# Patient Record
Sex: Male | Born: 1935
Health system: Southern US, Community
[De-identification: ages and names within clinical notes are randomized; demographics above are authoritative.]

## PROBLEM LIST (undated history)

## (undated) DIAGNOSIS — D649 Anemia, unspecified: Secondary | ICD-10-CM

## (undated) DIAGNOSIS — I5189 Other ill-defined heart diseases: Secondary | ICD-10-CM

## (undated) DIAGNOSIS — K469 Unspecified abdominal hernia without obstruction or gangrene: Secondary | ICD-10-CM

## (undated) DIAGNOSIS — Z860100 Personal history of colon polyps, unspecified: Secondary | ICD-10-CM

## (undated) DIAGNOSIS — I4510 Unspecified right bundle-branch block: Secondary | ICD-10-CM

## (undated) DIAGNOSIS — N200 Calculus of kidney: Secondary | ICD-10-CM

## (undated) DIAGNOSIS — S81802A Unspecified open wound, left lower leg, initial encounter: Secondary | ICD-10-CM

## (undated) DIAGNOSIS — I4821 Permanent atrial fibrillation: Secondary | ICD-10-CM

## (undated) DIAGNOSIS — N400 Enlarged prostate without lower urinary tract symptoms: Secondary | ICD-10-CM

## (undated) DIAGNOSIS — M1990 Unspecified osteoarthritis, unspecified site: Secondary | ICD-10-CM

## (undated) DIAGNOSIS — S46009A Unspecified injury of muscle(s) and tendon(s) of the rotator cuff of unspecified shoulder, initial encounter: Secondary | ICD-10-CM

## (undated) DIAGNOSIS — L02416 Cutaneous abscess of left lower limb: Secondary | ICD-10-CM

## (undated) DIAGNOSIS — K635 Polyp of colon: Secondary | ICD-10-CM

## (undated) DIAGNOSIS — E291 Testicular hypofunction: Secondary | ICD-10-CM

## (undated) DIAGNOSIS — K2970 Gastritis, unspecified, without bleeding: Secondary | ICD-10-CM

## (undated) HISTORY — DX: Calculus of kidney: N20.0

## (undated) HISTORY — PX: TOTAL HIP ARTHROPLASTY: SHX124

## (undated) HISTORY — DX: Gastritis, unspecified, without bleeding: K29.70

## (undated) HISTORY — DX: Unspecified abdominal hernia without obstruction or gangrene: K46.9

## (undated) HISTORY — PX: IRRIGATION AND DEBRIDEMENT HEMATOMA: SHX5254

## (undated) HISTORY — DX: Personal history of colon polyps, unspecified: Z86.0100

## (undated) HISTORY — DX: Permanent atrial fibrillation: I48.21

## (undated) HISTORY — DX: Testicular hypofunction: E29.1

## (undated) HISTORY — DX: Unspecified right bundle-branch block: I45.10

## (undated) HISTORY — PX: TRANSURETHRAL RESECTION OF PROSTATE: SHX73

## (undated) HISTORY — DX: Other ill-defined heart diseases: I51.89

## (undated) HISTORY — PX: OTHER SURGICAL HISTORY: SHX169

## (undated) HISTORY — DX: Unspecified injury of muscle(s) and tendon(s) of the rotator cuff of unspecified shoulder, initial encounter: S46.009A

## (undated) HISTORY — DX: Polyp of colon: K63.5

## (undated) HISTORY — PX: INGUINAL HERNIA REPAIR: SUR1180

## (undated) HISTORY — DX: Unspecified osteoarthritis, unspecified site: M19.90

## (undated) HISTORY — DX: Anemia, unspecified: D64.9

## (undated) HISTORY — DX: Benign prostatic hyperplasia without lower urinary tract symptoms: N40.0

## (undated) HISTORY — DX: Personal history of colonic polyps: Z86.010

---

## 1999-07-25 ENCOUNTER — Encounter: Payer: Self-pay | Admitting: Urology

## 1999-07-26 ENCOUNTER — Encounter: Payer: Self-pay | Admitting: Urology

## 1999-07-26 ENCOUNTER — Encounter (INDEPENDENT_AMBULATORY_CARE_PROVIDER_SITE_OTHER): Payer: Self-pay | Admitting: *Deleted

## 1999-07-26 ENCOUNTER — Ambulatory Visit (HOSPITAL_COMMUNITY): Admission: RE | Admit: 1999-07-26 | Discharge: 1999-07-26 | Payer: Self-pay | Admitting: Urology

## 1999-10-31 ENCOUNTER — Encounter: Admission: RE | Admit: 1999-10-31 | Discharge: 1999-10-31 | Payer: Self-pay | Admitting: Urology

## 1999-10-31 ENCOUNTER — Encounter: Payer: Self-pay | Admitting: Urology

## 2002-02-05 ENCOUNTER — Encounter: Payer: Self-pay | Admitting: General Surgery

## 2002-02-10 ENCOUNTER — Encounter (INDEPENDENT_AMBULATORY_CARE_PROVIDER_SITE_OTHER): Payer: Self-pay | Admitting: *Deleted

## 2002-02-10 ENCOUNTER — Observation Stay (HOSPITAL_COMMUNITY): Admission: RE | Admit: 2002-02-10 | Discharge: 2002-02-11 | Payer: Self-pay | Admitting: General Surgery

## 2007-06-22 ENCOUNTER — Ambulatory Visit (HOSPITAL_COMMUNITY): Admission: RE | Admit: 2007-06-22 | Discharge: 2007-06-23 | Payer: Self-pay | Admitting: General Surgery

## 2007-06-22 ENCOUNTER — Encounter (INDEPENDENT_AMBULATORY_CARE_PROVIDER_SITE_OTHER): Payer: Self-pay | Admitting: General Surgery

## 2007-06-22 ENCOUNTER — Encounter (INDEPENDENT_AMBULATORY_CARE_PROVIDER_SITE_OTHER): Payer: Self-pay | Admitting: *Deleted

## 2009-03-31 ENCOUNTER — Encounter (INDEPENDENT_AMBULATORY_CARE_PROVIDER_SITE_OTHER): Payer: Self-pay | Admitting: *Deleted

## 2009-05-03 ENCOUNTER — Ambulatory Visit: Payer: Self-pay | Admitting: Internal Medicine

## 2009-05-03 DIAGNOSIS — R1310 Dysphagia, unspecified: Secondary | ICD-10-CM | POA: Insufficient documentation

## 2009-05-03 DIAGNOSIS — D126 Benign neoplasm of colon, unspecified: Secondary | ICD-10-CM | POA: Insufficient documentation

## 2009-05-09 ENCOUNTER — Telehealth: Payer: Self-pay | Admitting: Internal Medicine

## 2009-06-02 ENCOUNTER — Ambulatory Visit: Payer: Self-pay | Admitting: Internal Medicine

## 2009-06-02 DIAGNOSIS — K2970 Gastritis, unspecified, without bleeding: Secondary | ICD-10-CM | POA: Insufficient documentation

## 2009-06-02 DIAGNOSIS — K259 Gastric ulcer, unspecified as acute or chronic, without hemorrhage or perforation: Secondary | ICD-10-CM | POA: Insufficient documentation

## 2009-06-02 DIAGNOSIS — K2990 Gastroduodenitis, unspecified, without bleeding: Secondary | ICD-10-CM

## 2009-06-06 ENCOUNTER — Encounter: Payer: Self-pay | Admitting: Internal Medicine

## 2009-07-26 ENCOUNTER — Encounter: Payer: Self-pay | Admitting: Internal Medicine

## 2009-07-27 ENCOUNTER — Encounter: Payer: Self-pay | Admitting: Internal Medicine

## 2009-08-03 DIAGNOSIS — M1711 Unilateral primary osteoarthritis, right knee: Secondary | ICD-10-CM | POA: Insufficient documentation

## 2009-08-03 DIAGNOSIS — M129 Arthropathy, unspecified: Secondary | ICD-10-CM | POA: Insufficient documentation

## 2009-08-03 DIAGNOSIS — J438 Other emphysema: Secondary | ICD-10-CM | POA: Insufficient documentation

## 2009-08-04 ENCOUNTER — Ambulatory Visit: Payer: Self-pay | Admitting: Internal Medicine

## 2009-08-04 DIAGNOSIS — I482 Chronic atrial fibrillation, unspecified: Secondary | ICD-10-CM | POA: Insufficient documentation

## 2009-08-24 ENCOUNTER — Encounter: Payer: Self-pay | Admitting: Internal Medicine

## 2009-08-24 ENCOUNTER — Ambulatory Visit: Payer: Self-pay

## 2009-08-24 ENCOUNTER — Ambulatory Visit: Payer: Self-pay | Admitting: Cardiology

## 2009-08-24 ENCOUNTER — Ambulatory Visit (HOSPITAL_COMMUNITY): Admission: RE | Admit: 2009-08-24 | Discharge: 2009-08-24 | Payer: Self-pay | Admitting: Internal Medicine

## 2009-11-21 ENCOUNTER — Ambulatory Visit (HOSPITAL_COMMUNITY): Admission: RE | Admit: 2009-11-21 | Discharge: 2009-11-21 | Payer: Self-pay | Admitting: Internal Medicine

## 2009-11-29 ENCOUNTER — Ambulatory Visit: Payer: Self-pay | Admitting: Internal Medicine

## 2010-06-13 ENCOUNTER — Encounter: Payer: Self-pay | Admitting: Internal Medicine

## 2010-06-13 ENCOUNTER — Other Ambulatory Visit: Payer: Self-pay | Admitting: Internal Medicine

## 2010-06-13 ENCOUNTER — Ambulatory Visit
Admission: RE | Admit: 2010-06-13 | Discharge: 2010-06-13 | Payer: Self-pay | Source: Home / Self Care | Attending: Internal Medicine | Admitting: Internal Medicine

## 2010-06-13 LAB — CBC WITH DIFFERENTIAL/PLATELET
Basophils Absolute: 0 10*3/uL (ref 0.0–0.1)
Basophils Relative: 0.5 % (ref 0.0–3.0)
Eosinophils Absolute: 0.2 10*3/uL (ref 0.0–0.7)
Eosinophils Relative: 2.2 % (ref 0.0–5.0)
HCT: 36.3 % — ABNORMAL LOW (ref 39.0–52.0)
Hemoglobin: 12.5 g/dL — ABNORMAL LOW (ref 13.0–17.0)
Lymphocytes Relative: 19.6 % (ref 12.0–46.0)
Lymphs Abs: 1.5 10*3/uL (ref 0.7–4.0)
MCHC: 34.5 g/dL (ref 30.0–36.0)
MCV: 105.8 fl — ABNORMAL HIGH (ref 78.0–100.0)
Monocytes Absolute: 0.6 10*3/uL (ref 0.1–1.0)
Monocytes Relative: 8.1 % (ref 3.0–12.0)
Neutro Abs: 5.4 10*3/uL (ref 1.4–7.7)
Neutrophils Relative %: 69.6 % (ref 43.0–77.0)
Platelets: 138 10*3/uL — ABNORMAL LOW (ref 150.0–400.0)
RBC: 3.43 Mil/uL — ABNORMAL LOW (ref 4.22–5.81)
RDW: 13.8 % (ref 11.5–14.6)
WBC: 7.7 10*3/uL (ref 4.5–10.5)

## 2010-06-13 LAB — BASIC METABOLIC PANEL
BUN: 17 mg/dL (ref 6–23)
CO2: 25 mEq/L (ref 19–32)
Calcium: 8.8 mg/dL (ref 8.4–10.5)
Chloride: 106 mEq/L (ref 96–112)
Creatinine, Ser: 1.4 mg/dL (ref 0.4–1.5)
GFR: 52.55 mL/min — ABNORMAL LOW (ref >60.00)
Glucose, Bld: 69 mg/dL — ABNORMAL LOW (ref 70–99)
Potassium: 3.9 mEq/L (ref 3.5–5.1)
Sodium: 138 mEq/L (ref 135–145)

## 2010-06-26 NOTE — Miscellaneous (Signed)
Summary: Orders Update/ clotest  Clinical Lists Changes  Problems: Added new problem of GASTRIC ULCER (ICD-531.90) Added new problem of GASTRITIS (ICD-535.50) Orders: Added new Test order of TLB-H Pylori Screen Gastric Biopsy (83013-CLOTEST) - Signed

## 2010-06-26 NOTE — Procedures (Signed)
Summary: Upper Endoscopy  Patient: [name] Note: All result statuses are Final unless otherwise noted.  Tests: (1) Upper Endoscopy (EGD)   EGD Upper Endoscopy       DONE     [facility]     [address]     [place], [place]  [address]           ENDOSCOPY PROCEDURE REPORT           PATIENT:  Gerald, Sandoval  MR#:  [id number]     BIRTHDATE:  04-07-1924, 85 yrs. old  GENDER:  male           ENDOSCOPIST:  Wilhemina Bonito. Eda Keys, MD     Referred by:  Rodrigo Ran, M.D.           PROCEDURE DATE:  06/02/2009     PROCEDURE:  EGD with biopsy, Maloney Dilation of Esophagus -9f     ASA CLASS:  Class III     INDICATIONS:  dysphagia           MEDICATIONS:   There was residual sedation effect present from     prior procedure., Versed 1 mg IM     TOPICAL ANESTHETIC:  Exactacain Spray           DESCRIPTION OF PROCEDURE:   After the risks benefits and     alternatives of the procedure were thoroughly explained, informed     consent was obtained.  The [facility] [id number] endoscope was     introduced through the mouth and advanced to the second portion of     the duodenum, without limitations.  The instrument was slowly     withdrawn as the mucosa was fully examined.     <<PROCEDUREIMAGES>>           A large caliber stricture was found in the distal esophagus.  A     hiatal hernia was found.  Multiple erosions were found in the     antrum.Clo bx taken.  Otherwise the examination was normal.     Retroflexed views revealed the hiatal hernia.    The scope was then     withdrawn from the patient and the procedure completed.           THERAPY: 46F MALONEY DILATION W/O RESISTANCE OR HEME           COMPLICATIONS:  None           ENDOSCOPIC IMPRESSION:     1) Stricture in the distal esophagus- S/P 46F MALONEY DILATION     2) Hiatal hernia     3) Erosions, multiple in the antrum - S/P CLO TEST     4) Otherwise normal examination           RECOMMENDATIONS:     1) Clear liquids until 5PM,  then soft foods rest of day. Resume     prior diet tomorrow.           ______________________________     Wilhemina Bonito. Eda Keys, MD           CC:  Rodrigo Ran, MD, The Patient           n.     eSIGNED:   Wilhemina Bonito. Eda Keys at 06/02/2009 03:26 PM           Evenson, Fayrene Fearing, [phone]  Note: An exclamation mark (!) indicates a result that was not dispersed into the flowsheet. Document Creation Date: 06/02/2009 3:26 PM  _______________________________________________________________________  (1) Order result status: Final Collection or observation date-time: 06/02/2009 14:55 Requested date-time:  Receipt date-time:  Reported date-time:  Referring Physician:   Ordering Physician: Fransico Setters [phone]) Specimen Source:  Source: Launa Grill Order Number: [id number] Lab site:

## 2010-06-26 NOTE — Assessment & Plan Note (Signed)
Summary: 4 month rov/sl;   Visit Type:  Follow-up Referring Provider:  Loraine Leriche Perini,MD Primary Provider:  Loraine Leriche Perini,MD   History of Present Illness: The patient presents today for routine electrophysiology followup. He reports doing very well since last being seen in our clinic.  He reports mild dypsnea with moderate activity.   The patient denies symptoms of palpitations, chest pain, shortness of breath at rest, orthopnea, PND, lower extremity edema, dizziness, presyncope, syncope, or neurologic sequela. The patient is tolerating medications without difficulties and is otherwise without complaint today.   Current Medications (verified): 1)  Amoxicillin 500 Mg Caps (Amoxicillin) .Marland Kitchen.. 1 By Mouth Two Times A Day 2)  Finasteride 5 Mg Tabs (Finasteride) .Marland Kitchen.. 1 By Mouth Once Daily 3)  Bactrim Ds 800-160 Mg Tabs (Sulfamethoxazole-Trimethoprim) .Marland Kitchen.. 1 By Mouth Two Times A Day 4)  Vitamin D (Ergocalciferol) 50000 Unit Caps (Ergocalciferol) .Marland Kitchen.. 1 By Mouth Twice Weekly As Directed 5)  Metoprolol Succinate 50 Mg Xr24h-Tab (Metoprolol Succinate) .Marland Kitchen.. 1 1/2 By Mouth Once Daily 6)  Iron 325 (65 Fe) Mg Tabs (Ferrous Sulfate) .Marland Kitchen.. 1 By Mouth Once Daily 7)  K-Phos 500 Mg Tabs (Potassium Phosphate Monobasic) .... Take Up To 2-3 Tablets Once Daily With Food As Directed 8)  Vitamin B-12 5000 Mcg Subl (Cyanocobalamin) .Marland Kitchen.. 1 By Mouth Once Daily 9)  Pradaxa 150 Mg Caps (Dabigatran Etexilate Mesylate) .Marland Kitchen.. 1 Capsule Two Times A Day  Allergies: 1)  ! * Primidone  Past History:  Past Medical History: Persistent afib OSTEOARTHRITIS (ICD-715.90) GASTRITIS (ICD-535.50) GASTRIC ULCER (ICD-531.90) COLONIC POLYPS (ICD-211.3) DYSPHAGIA UNSPECIFIED (ICD-787.20) EMPHYSEMA (ICD-492.8) (pt unaware) CHF BPH Rotator Cuff Injury Rt. Inguinial Hernia RBBB B12 Insufficiency Kidney Stones  Past Surgical History: Reviewed history from 05/03/2009 and no changes required. TURP Rt. Inguinal Hernia Repair lt.  THR Resection of ribs hip replacement  both hips  Social History: Reviewed history from 08/04/2009 and no changes required. Pt lives in [place] alone.   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  His closest living relative is his sister who is 57 and lives in a nursing home. Illicit Drug Use - no no caffeine Patient denies any significant h/o Tobacco. Alcohol Use - no  Vital Signs:  Patient profile:   77 year old male Height:      68 inches Weight:      148 pounds BMI:     22.58 Pulse rate:   78 / minute BP sitting:   116 / 68  (left arm)  Vitals Entered By: Laurance Flatten CMA (November 29, 2009 10:45 AM)  Physical Exam  General:  elderly male, NAD Head:  normocephalic and atraumatic Eyes:  PERRLA/EOM intact; conjunctiva and lids normal. Mouth:  Teeth, gums and palate normal. Oral mucosa normal. Neck:  supple Lungs:  Clear bilaterally to auscultation and percussion. Heart:  iRRR, no m/r/g Abdomen:  Bowel sounds positive; abdomen soft and non-tender without masses, organomegaly, or hernias noted. No hepatosplenomegaly. Msk:  Back normal, normal gait. Muscle strength and tone normal. Pulses:  pulses normal in all 4 extremities Extremities:  No clubbing or cyanosis. Neurologic:  Alert and oriented x 3.   EKG  Procedure date:  11/29/2009  Findings:      afib,  V rate 70s, LVH, RBBB  Impression & Recommendations:  Problem # 1:  ATRIAL FIBRILLATION (ICD-427.31) stable with minimal symptoms rate controlled and appropriately anticoagulated with pradaxa recent echo is reviewed and reveals normal EF.  His LA/RA are enlarged suggesting that afib is longstanding.  no changes today  Patient Instructions: 1)  Your physician wants you to follow-up in: 6 months with Dr Johney Frame.  You will receive a reminder letter in the mail two months in advance. If you don't receive a letter, please call our office to  schedule the follow-up appointment. 2)  Your physician recommends that you continue on your current medications as directed. Please refer to the Current Medication list given to you today.

## 2010-06-26 NOTE — Letter (Signed)
Summary: [facility] Office Note  [facility] Office Note   Imported By: Roderic Ovens 08/17/2009 14:32:04  _____________________________________________________________________  External Attachment:    Type:   Image     Comment:   External Document

## 2010-06-26 NOTE — Procedures (Signed)
Summary: Colonoscopy  Patient: [name] Note: All result statuses are Final unless otherwise noted.  Tests: (1) Colonoscopy (COL)   COL Colonoscopy           DONE     [facility]     [address]     [place], [place]  [address]           COLONOSCOPY PROCEDURE REPORT           PATIENT:  Gerald Sandoval, Gerald Sandoval  MR#:  [id number]     BIRTHDATE:  1924-02-11, 85 yrs. old  GENDER:  male           ENDOSCOPIST:  Wilhemina Bonito. Eda Keys, MD     Referred by:  Rodrigo Ran, M.D.           PROCEDURE DATE:  06/02/2009     PROCEDURE:  Colonoscopy with snare polypectomy     ASA CLASS:  Class III     INDICATIONS:  history of pre-cancerous (adenomatous) colon polyps     (02-2003 w/ HGD)           MEDICATIONS:   Fentanyl 25 mcg IV, Versed 3 mg IV           DESCRIPTION OF PROCEDURE:   After the risks benefits and     alternatives of the procedure were thoroughly explained, informed     consent was obtained.  Digital rectal exam was performed and     revealed no abnormalities.   The [facility] [id number] endoscope     was introduced through the anus and advanced to the cecum, which     was identified by both the appendix and ileocecal valve, without     limitations. Time to cecum = 4:35min. The quality of the prep was     excellent, using MoviPrep.  The instrument was then slowly     withdrawn (time = 18:41 min)as the colon was fully examined.     <<PROCEDUREIMAGES>>           FINDINGS:  Three polyps were found in the ascending colon     (64mm,3mm) and sigmoid (4mm). Polyps were snared without cautery.     Retrieval was successful. snare polyp  Moderate diverticulosis was     found in the sigmoid colon.   Retroflexed views in the rectum     revealed internal hemorrhoids.    The scope was then withdrawn     from the patient and the procedure completed.           COMPLICATIONS:  None           ENDOSCOPIC IMPRESSION:     1) Three polyps -removed     2) Moderate diverticulosis in the sigmoid colon     3) Internal hemorrhoids     RECOMMENDATIONS:     1) Return to the care of your primary provider. GI follow up as     needed           ______________________________     Wilhemina Bonito. Eda Keys, MD           CC:  Rodrigo Ran, MD, The Patient           n.     eSIGNED:   Wilhemina Bonito. Eda Keys at 06/02/2009 03:19 PM           Haggar, Fayrene Fearing, [id number]  Note: An exclamation mark (!) indicates a result that was not dispersed into the flowsheet. Document Creation  Date: 06/02/2009 3:19 PM _______________________________________________________________________  (1) Order result status: Final Collection or observation date-time: 06/02/2009 14:47 Requested date-time:  Receipt date-time:  Reported date-time:  Referring Physician:   Ordering Physician: Fransico Setters [phone]) Specimen Source:  Source: Launa Grill Order Number: [id number] Lab site:

## 2010-06-26 NOTE — Letter (Signed)
Summary: Patient Notice- Polyp Results  [facility]  [address] [place], [place] [address]   Phone: [phone]  Fax: [phone]        June 06, 2009 MRN: [id number]    Gerald Sandoval [address] [place], [place]  [address]    Dear [name],  I am pleased to inform you that the colon polyp(s) removed during your recent colonoscopy was (were) found to be benign (no cancer detected) upon pathologic examination.   Additional information/recommendations:  __ No further action with gastroenterology is needed at this time. Please      follow-up with your primary care physician for your other healthcare      needs.   Please call us if you are having persistent problems or have questions about your condition that have not been fully answered at this time.  Sincerely,  Hilarie Fredrickson MD  This letter has been electronically signed by your physician.  Appended Document: Patient Notice- Polyp Results Letter mailed 01.13.11

## 2010-06-26 NOTE — Assessment & Plan Note (Signed)
Summary: nep/new onset afib   Visit Type:  Initial Consult Referring Provider:  Emiliano Dyer Primary Provider:  Loraine Leriche Perini,MD  CC:  afib.  History of Present Illness: Mr Gerald Sandoval is a pleasant 77 yo WM with newly discovered atrial fibrillation who presents today for cardiology consultation.  He had a GI workup including EGD/colonoscopy 1/11.  He was observed on rhythm strip to have atrial fibrillation.  He subsequently  presented to Dr Johny Chess 3/11 and was again found to be in atrial fibrillation.  The patient denies symptoms of palpitations, chest pain, shortness of breath, orthopnea, PND, lower extremity edema, dizziness, presyncope, syncope, or neurologic sequela.  He remains active and does yard work without difficulty.  He has beel placed on pradaxa which he is tolerating.  He is otherwise without complaint today.    Current Medications (verified): 1)  Amoxicillin 500 Mg Caps (Amoxicillin) .Marland Kitchen.. 1 By Mouth Two Times A Day 2)  Finasteride 5 Mg Tabs (Finasteride) .Marland Kitchen.. 1 By Mouth Once Daily 3)  Bactrim Ds 800-160 Mg Tabs (Sulfamethoxazole-Trimethoprim) .Marland Kitchen.. 1 By Mouth Two Times A Day 4)  Vitamin D (Ergocalciferol) 50000 Unit Caps (Ergocalciferol) .Marland Kitchen.. 1 By Mouth Twice Weekly As Directed 5)  Metoprolol Succinate 50 Mg Xr24h-Tab (Metoprolol Succinate) .Marland Kitchen.. 1 By Mouth Once Daily 6)  Iron 325 (65 Fe) Mg Tabs (Ferrous Sulfate) .Marland Kitchen.. 1 By Mouth Once Daily 7)  K-Phos 500 Mg Tabs (Potassium Phosphate Monobasic) .... Take Up To 2-3 Tablets Once Daily With Food As Directed 8)  Vitamin B-12 5000 Mcg Subl (Cyanocobalamin) .Marland Kitchen.. 1 By Mouth Once Daily 9)  Pradaxa 150 Mg Caps (Dabigatran Etexilate Mesylate) .Marland Kitchen.. 1 Capsule Two Times A Day  Allergies: 1)  ! * Primidone  Past History:  Past Medical History: ARTHRITIS (ICD-716.90) OSTEOARTHRITIS (ICD-715.90) GASTRITIS (ICD-535.50) GASTRIC ULCER (ICD-531.90) COLONIC POLYPS (ICD-211.3) DYSPHAGIA UNSPECIFIED (ICD-787.20) EMPHYSEMA (ICD-492.8) (pt  unaware) CHF BPH Rotator Cuff Injury Rt. Inguinial Hernia RBBB B12 Insufficiency Kidney Stones  Past Surgical History: Reviewed history from 05/03/2009 and no changes required. TURP Rt. Inguinal Hernia Repair lt. THR Resection of ribs hip replacement  both hips  Family History: Reviewed history from 05/03/2009 and no changes required. Family History of Colon Cancer:uncle Family History of Diabetes: aunt  Social History: Pt lives in [place] alone.   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  His closest living relative is his sister who is 67 and lives in a nursing home. Illicit Drug Use - no no caffeine Patient denies any significant h/o Tobacco. Alcohol Use - no  Review of Systems       All systems are reviewed and negative except as listed in the HPI.   Vital Signs:  Patient profile:   77 year old male Height:      68 inches Weight:      153 pounds Pulse rate:   86 / minute BP sitting:   130 / 66  (left arm)  Vitals Entered By: Laurance Flatten CMA (August 04, 2009 11:41 AM)  Physical Exam  General:  thin, edlery male, NAD Head:  normocephalic and atraumatic Eyes:  PERRLA/EOM intact; conjunctiva and lids normal. Mouth:  Teeth, gums and palate normal. Oral mucosa normal. Neck:  Neck supple, no JVD. No masses, thyromegaly or abnormal cervical nodes. Lungs:  Clear bilaterally to auscultation and percussion. Heart:  iRRR, no m/r/g Abdomen:  Bowel sounds positive; abdomen soft and non-tender without masses, organomegaly, or hernias noted. No hepatosplenomegaly.  Msk:  Back normal, normal gait. Muscle strength and tone normal. Pulses:  pulses normal in all 4 extremities Extremities:  No clubbing or cyanosis. Trace edema Neurologic:  Alert and oriented x 3.  CNII-XII intact, strength/sensation are intact Skin:  several precancerous skin lesions (pt aware) Cervical Nodes:  no significant  adenopathy Psych:  Normal affect.   EKG  Procedure date:  08/04/2009  Findings:      afib, V rate 86, RBBB, nonspecific ST/T changes  Impression & Recommendations:  Problem # 1:  ATRIAL FIBRILLATION (ICD-427.31) The patient presents today for consultation regarding afib.  He has been found to have persistent afib, without significant symptoms.  He is without symptoms of CHF or ischemia.  A recent TSH by PCP was normal.  His CHADS2 score is 1.  He is appropriately anticoagulated with pradaxa.  Therapeutic strategies for afib were discussed in detail with the patient today. As he is asymptomatic, I would favor a strategy of rate control longterm.  I have increased toprol xl to 75mg  daliy today.  He should continue coumadin longterm.  We will obtain an echocardiogram to evaluate for structual heart disease.  If his echo looks OK, then I would not recommend further cardiac testing at this time.  Other Orders: Echocardiogram (Echo)  Patient Instructions: 1)  Your physician recommends that you schedule a follow-up appointment in: 4 months with Dr Johney Frame 2)  Your physician has recommended you make the following change in your medication: increase your Metoprolol XL to 75mg  daily 3)  Your physician has requested that you have an echocardiogram.  Echocardiography is a painless test that uses sound waves to create images of your heart. It provides your doctor with information about the size and shape of your heart and how well your heart's chambers and valves are working.  This procedure takes approximately one hour. There are no restrictions for this procedure. Prescriptions: METOPROLOL SUCCINATE 50 MG XR24H-TAB (METOPROLOL SUCCINATE) 1 1/2 by mouth once daily  #45 x 11   Entered by:   Dennis Bast, RN, BSN   Authorized by:   Hillis Range, MD   Signed by:   Dennis Bast, RN, BSN on 08/04/2009   Method used:   Electronically to        [facility] (retail)       [address]       [place], [place]  [address]       Ph: [phone]       Fax: [phone]   RxID:   [id number]

## 2010-06-28 NOTE — Assessment & Plan Note (Signed)
Summary: 6 month return.amber   Visit Type:  Follow-up Referring Provider:  Loraine Leriche Perini,MD Primary Provider:  Loraine Leriche Perini,MD   History of Present Illness: The patient presents today for routine electrophysiology followup. He reports doing very well since last being seen in our clinic.  He reports mild dypsnea with moderate activity which has not changed.   The patient denies symptoms of palpitations, chest pain, shortness of breath at rest, orthopnea, PND, lower extremity edema, dizziness, presyncope, syncope, or neurologic sequela. The patient is tolerating medications without difficulties and is otherwise without complaint today.   Current Medications (verified): 1)  Amoxicillin 500 Mg Caps (Amoxicillin) .Marland Kitchen.. 1 By Mouth Two Times A Day 2)  Finasteride 5 Mg Tabs (Finasteride) .Marland Kitchen.. 1 By Mouth Once Daily 3)  Bactrim Ds 800-160 Mg Tabs (Sulfamethoxazole-Trimethoprim) .Marland Kitchen.. 1 By Mouth Two Times A Day 4)  Vitamin D (Ergocalciferol) 50000 Unit Caps (Ergocalciferol) .Marland Kitchen.. 1 By Mouth Twice Weekly As Directed 5)  Metoprolol Succinate 50 Mg Xr24h-Tab (Metoprolol Succinate) .Marland Kitchen.. 1 1/2 By Mouth Once Daily 6)  Iron 325 (65 Fe) Mg Tabs (Ferrous Sulfate) .Marland Kitchen.. 1 By Mouth Once Daily 7)  K-Phos 500 Mg Tabs (Potassium Phosphate Monobasic) .... Take Up To 2-3 Tablets Once Daily With Food As Directed 8)  Vitamin B-12 5000 Mcg Subl (Cyanocobalamin) .Marland Kitchen.. 1 By Mouth Once Daily 9)  Pradaxa 150 Mg Caps (Dabigatran Etexilate Mesylate) .Marland Kitchen.. 1 Capsule Two Times A Day  Allergies (verified): 1)  ! * Primidone  Past History:  Past Medical History: Permanent afib OSTEOARTHRITIS (ICD-715.90) GASTRITIS (ICD-535.50) GASTRIC ULCER (ICD-531.90) COLONIC POLYPS (ICD-211.3) DYSPHAGIA UNSPECIFIED (ICD-787.20) EMPHYSEMA (ICD-492.8) (pt unaware) CHF BPH Rotator Cuff Injury Rt. Inguinial Hernia RBBB B12 Insufficiency Kidney Stones  Past Surgical History: Reviewed history from 05/03/2009 and no changes  required. TURP Rt. Inguinal Hernia Repair lt. THR Resection of ribs hip replacement  both hips  Social History: Reviewed history from 08/04/2009 and no changes required. Pt lives in [place] alone.   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  His closest living relative is his sister who is 76 and lives in a nursing home. Illicit Drug Use - no no caffeine Patient denies any significant h/o Tobacco. Alcohol Use - no  Review of Systems       All systems are reviewed and negative except as listed in the HPI.   Vital Signs:  Patient profile:   77 year old male Height:      68 inches Weight:      147 pounds Pulse rate:   72 / minute BP sitting:   110 / 70  (left arm)  Vitals Entered By: Jacquelin Hawking, CMA (June 13, 2010 12:09 PM)  Physical Exam  General:  elderly male, NAD Head:  normocephalic and atraumatic Eyes:  PERRLA/EOM intact; conjunctiva and lids normal. Mouth:  Teeth, gums and palate normal. Oral mucosa normal. Neck:  supple Lungs:  Clear bilaterally to auscultation and percussion. Heart:  iRRR, no m/r/g Abdomen:  Bowel sounds positive; abdomen soft and non-tender without masses, organomegaly, or hernias noted. No hepatosplenomegaly. Msk:  Back normal, normal gait. Muscle strength and tone normal. Extremities:  No clubbing or cyanosis. Neurologic:  Alert and oriented x 3. Skin:  several precancerous skin lesions (pt aware) Psych:  Normal affect.   EKG  Procedure date:  06/13/2010  Findings:      afib, V rate 69 bpm, RBBB  Impression & Recommendations:  Problem #  1:  ATRIAL FIBRILLATION (ICD-427.31) rate controlled tolerating pradaxa His CrCl today is 37.  We will therefore continue pradaxa 150mg  two times a day at this time.  We will continue to monitor his CrCl and decrease the dose if CrCl drops below 30.   He has a macrocytic anemai.  I will have patient follow-up  with Dr Waynard Edwards for this.  Other Orders: EKG w/ Interpretation ([address]) TLB-BMP (Basic Metabolic Panel-BMET) (80048-METABOL) TLB-CBC Platelet - w/Differential (85025-CBCD)  Patient Instructions: 1)  Your physician wants you to follow-up in: 6 months with Dr Johney Frame  Bonita Quin will receive a reminder letter in the mail two months in advance. If you don't receive a letter, please call our office to schedule the follow-up appointment. 2)  Your physician recommends that you continue on your current medications as directed. Please refer to the Current Medication list given to you today.

## 2010-08-13 ENCOUNTER — Telehealth: Payer: Self-pay | Admitting: Internal Medicine

## 2010-08-23 NOTE — Progress Notes (Signed)
Summary: rx refill   Phone Note Refill Request Message from:  Patient on August 13, 2010 9:22 AM  Refills Requested: Medication #1:  METOPROLOL SUCCINATE 50 MG XR24H-TAB 1 1/2 by mouth once daily  Method Requested: Telephone to Pharmacy Initial call taken by: Roe Coombs,  August 13, 2010 9:22 AM Caller: Patient    Prescriptions: METOPROLOL SUCCINATE 50 MG XR24H-TAB (METOPROLOL SUCCINATE) 1 1/2 by mouth once daily  #45 x 10   Entered by:   Hardin Negus, RMA   Authorized by:   Hillis Range, MD   Signed by:   Hardin Negus, RMA on 08/13/2010   Method used:   Electronically to        [facility]* (retail)       [address]       [place], [place]  [id number]       Ph: [phone]       Fax: [phone]   RxID:   [id number]

## 2010-10-09 NOTE — Op Note (Signed)
NAME:  Gerald Sandoval, Gerald Sandoval                 ACCOUNT NO.:  [id number]   MEDICAL RECORD NO.:  [id number]          PATIENT TYPE:  AMB   LOCATION:  DAY                          FACILITY:  [facility]   PHYSICIAN:  Anselm Pancoast. Weatherly, M.D.DATE OF BIRTH:  02/10/1924   DATE OF PROCEDURE:  06/22/2007  DATE OF DISCHARGE:                               OPERATIVE REPORT   PREOPERATIVE DIAGNOSIS:  Large left inguinal hernia, probably slider.   POSTOPERATIVE DIAGNOSIS:  Left inguinal hernia with large segment of  colon in hernia sac.   OPERATION:  Left inguinal herniorrhaphy with mesh reinforcement.  It was  a slider.   SURGEON:  Anselm Pancoast. Zachery Dakins, M.D.   ASSISTANT:  Nurse.   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  He has a problem with BPH.  I had also repaired a right  inguinal hernia back in 2003.  He is followed by Dr. Aldean Ast with BPH-  type symptoms.  The patient is living in a retirement home and stated  that he had had a problem with a hip replacement over at [facility]  approximately 3 years ago for which he is on chronic antibiotics.  It is  not MRSA, and on examination of the hip I could not see any obvious  evidence of infection.  We got the records from [facility].  He is on  amoxicillin.  I have a note from the infectious disease nurse, but  amoxicillin covers it, and they thought it would be safe for [place] to go  ahead and proceed with his inguinal herniorrhaphy and also use mesh  since he has showed no signs of any active infection over the last year  and a half.  The patient has a large hernia, about size of orange, that  is not reducible.  There is no blood in the stool and preoperatively had  a bottle of mag citrate yesterday.  His white count this morning is  5600, and his serum electrolytes are normal.  BUN is 17, creatinine of  117. He has nocturia about three to  four times a night, and I discussed  about placing a Foley catheter in him at the time of his herniorrhaphy,  and we will see about removing it the morning after surgery, and if he  is able to void, will discharge him.  He is on Proscar for urinary  problems.   Preoperatively, he was given antibiotics.  He has PAS stockings.  He was  taken back to the operative suite and induction of anesthesia by Dr.  Shireen Quan, endotracheal tube, and then I placed a Foley catheter, #16,  easily and then shaved or clipped the groin on the left that had been  previously marked and then draped him in a sterile manner after a  Betadine prep.  The patient's hernia, with him asleep, you could still  feel bowel within the hernia sac, but it was not as large as when he is  standing.  I first placed about 10 mL of Marcaine with adrenalin in the  inguinal ligament area right at the anterior iliac crest area with a  blunted 22-gauge needle and then made an incision down through the skin  and subcutaneous Scarpa's fascia.  Opened up the external ring, and then  the cord structures were elevated and dissected right over the symphysis  pubis area with a Penrose drain.  Intestine was in the hernia, and I  elected to dissect down carefully and opened the hernia sac, and there  was probably about 12 inches or more of sigmoid colon but 5 or 6 of it  actually along the posterior wall of the hernia sac inferiorly and  laterally.  I carefully dissected the colon from the hernia sac so I  could place the sigmoid colon back within the peritoneal cavity and then  used the peritoneum to close it anteriorly.  This had been taken down  very carefully so I did not compromise the blood supply to the colon or  compromise the blood supply going to the testicle.  The cord structures  all were retracted with the Penrose after I had freed up the colon and  placed it back intraperitoneally, and then the peritoneum was closed at  the  internal ring area with a running 2-0 Vicryl.  This is what I used  also to extraperitonealize the raw surface of the sigmoid colon after it  had been freed from the actual hernia sac.  I then closed the floor that  had been completely destroyed with this large chronically incarcerated  hernia starting at the symphysis pubis with a 2-0 Prolene, bringing down  the conjoined tendon area medially to the inguinal ligament inferiorly  and then recreated the internal ring.  I could still admit my index  finger in easily, but I think we got a good closure and then went back  with the 2-0 Prolene and tied the two tails together.  I next put a  piece of Prolene mesh, reinforcing the repair and sutured it to the  inguinal ligament inferiorly with a running 2-0 Prolene.  The mesh is  under the iliohypogastric which came out pretty close to the cord  structures, and the two tails were sutured together laterally, taking  care that we did not compromise the structures or the vessels to the  testicle.  Next, the superior flap was sutured with interrupted sutures  of 2-0 Prolene.  I did put some more Marcaine in the floor to minimize  postoperative pain and then closed the external oblique with a 2-0  Vicryl, and then Scarpa's fascia was closed with interrupted 3-0 Vicryl  and a 4-0 Dexon subcuticular, with Benzoin and Steri-Strips on the skin.  The testicle was in its normal position.  The area was flat where the  bulge had been preoperatively.   The patient was extubated and taken to the recovery room in stable  postoperative condition.  The urine was always clear, and we are  planning to leave the Foley in until tomorrow.  I will let him be on  liquids today, but I would like for him to be able to void and have  bowel function before he is discharged.  The patient's colon appeared to  be decompressed with the laxative the he had received yesterday.  Sponge  and needle counts were correct x2.  The  estimated blood loss was  minimal.  A sterile occlusive dressing was applied.  The testicle  was in  its normal position.  We will continue all of his usual medications,  including the Proscar today and hopefully will be able to be released in  the a.m.           ______________________________  Anselm Pancoast. Zachery Dakins, M.D.     WJW/MEDQ  D:  06/22/2007  T:  06/22/2007  Job:  [id number]   cc:   Courtney Paris, M.D.  Fax: [phone]   Loraine Leriche A. Perini, M.D.  Fax: [phone]

## 2010-10-12 NOTE — Op Note (Signed)
NAME:  Gerald Sandoval NO.:  [id number]   MEDICAL RECORD NO.:  [id number]                   PATIENT TYPE:  AMB   LOCATION:  DAY                                  FACILITY:  [facility]   PHYSICIAN:  Anselm Pancoast. Zachery Dakins, M.D.          DATE OF BIRTH:  20-Apr-1924   DATE OF PROCEDURE:  DATE OF DISCHARGE:                                 OPERATIVE REPORT   PREOPERATIVE DIAGNOSES:  Large right direct inguinal hernia.   POSTOPERATIVE DIAGNOSES:  Large right direct inguinal hernia.   OPERATION:  Right inguinal herniorrhaphy with mesh.   ANESTHESIA:  Local with sedation.   SURGEON:  Anselm Pancoast. Zachery Dakins, M.D.   ASSISTANT:  Nurse.   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 He  was having problems with his prostate and I referred him to Dr. Aldean Ast  who did a  TUR and the patient never returned for repair of his hernia.  Recently the hernia has become larger and he was referred by his medical  doctor to me and on exam, he has got a large softball size direct hernia  that you can reduce with some effort. He is not having problems with his  prostate and I recommended that we repair this with local and sedation. He  is 38 and it was thought best to do him in the hospital and we are planning  to keep him overnight. The patient preoperatively had a chest x-ray that  they question an abnormality but the patient states that this was questioned  about two years ago and he had a CT at that time that they thought was all  benign. I did not repeat the chest CT.   DESCRIPTION OF PROCEDURE:  The patient was given a gram of Kefzol  preoperatively and taken to the operative suite, given some sedation. The  right groin area was first shaved and then prepped with Betadine surgical  scrub and solution and draped in a sterile manner. The inguinal incision  area was  infiltrated with a mixture of 0.5% plain Xylocaine and 0.25  Marcaine with adrenaline and the ilioinguinal nerve area was anesthetized.  All total about 30 cc of solution used. The inguinal incision was made and  sharp dissection in the skin and subcutaneous tissue, Scarpa's fascia and  external oblique opened and then the cord structures were elevated with a  Penrose. This large direct hernia was separated from the cord structures,  dissected right then identifying the epigastric vessel and then I elected to  kind of open this, reduced the peritoneum and bladder and then trimmed the  transversalis area that has just given way and then repaired it sort of in  like a Shouldice repair initially with running 2-0 Prolene and recreated the  internal ring and then taking  the two tails back together and tying them.  Next, a piece of Prolene mesh shaped like a sail slit laterally was sutured  starting at the symphysis pubis, sutured to under the inguinal ligament  inferiorly and then the superior flap sutured down with interrupted 2-0  Prolene sutures. The two tails were then sutured together laterally  recreating an internal ring that is not tight and I don't think will cause  any testicular swelling. The patient was asked to strain and cough and the  mesh is lying flat, no point of excessive tension and then the external  oblique  was closed with a 3-0 Vicryl and then Scarpa's fascia closed with  interrupted 3-0 Vicryl below the Vicryl subcuticular and Benzoin and Steri-  Strips on the skin. The patient will be kept overnight, will be released in  the morning and hopefully will be able to void without problems.                                                Anselm Pancoast. Zachery Dakins, M.D.    WJW/MEDQ  D:  02/10/2002  T:  02/11/2002  Job:  [id number]   cc:   Loraine Leriche A. Perini, M.D.   [place] Shelle Iron., M.D.

## 2010-12-06 ENCOUNTER — Encounter: Payer: Self-pay | Admitting: Internal Medicine

## 2010-12-10 ENCOUNTER — Ambulatory Visit (INDEPENDENT_AMBULATORY_CARE_PROVIDER_SITE_OTHER): Payer: Medicare PPO | Admitting: Internal Medicine

## 2010-12-10 ENCOUNTER — Encounter: Payer: Self-pay | Admitting: Internal Medicine

## 2010-12-10 VITALS — BP 92/60 | Resp 16 | Ht 68.0 in | Wt 145.0 lb

## 2010-12-10 DIAGNOSIS — I4891 Unspecified atrial fibrillation: Secondary | ICD-10-CM

## 2010-12-10 LAB — BASIC METABOLIC PANEL
Chloride: 102 mEq/L (ref 96–112)
Potassium: 4.5 mEq/L (ref 3.5–5.3)
Sodium: 136 mEq/L (ref 135–145)

## 2010-12-10 LAB — CBC
HCT: 36.7 % — ABNORMAL LOW (ref 39.0–52.0)
MCHC: 34.3 g/dL (ref 30.0–36.0)
Platelets: 164 10*3/uL (ref 150–400)
RDW: 13.2 % (ref 11.5–15.5)
WBC: 6.1 10*3/uL (ref 4.0–10.5)

## 2010-12-10 NOTE — Patient Instructions (Signed)
Your physician wants you to follow-up in: 6 months with Dr Jacquiline Doe will receive a reminder letter in the mail two months in advance. If you don't receive a letter, please call our office to schedule the follow-up appointment.  Your physician recommends that you return for lab work today BMP/CBC

## 2010-12-10 NOTE — Progress Notes (Signed)
The patient presents today for routine electrophysiology followup.  Since last being seen in our clinic, the patient reports doing very well.   He remains very active despite his age. Today, he denies symptoms of palpitations, chest pain, shortness of breath, orthopnea, PND, lower extremity edema, dizziness, presyncope, syncope, or neurologic sequela.  The patient feels that he is tolerating medications without difficulties and is otherwise without complaint today.   Past Medical History  Diagnosis Date  . Permanent atrial fibrillation   . Osteoarthritis   . Gastritis   . Gastric ulcer   . History of colonic polyps   . Dysphagia     unspecified  . Emphysema   . Diastolic dysfunction   . BPH (benign prostatic hyperplasia)   . Rotator cuff injury   . Hernia     Rt. Inguinial  . RBBB (right bundle branch block)   . Kidney stones    Past Surgical History  Procedure Date  . Transurethral resection of prostate   . Inguinal hernia repair     RT  . Total hip arthroplasty     Both hips  . Resection of ribs     Current Outpatient Prescriptions  Medication Sig Dispense Refill  . amoxicillin (AMOXIL) 500 MG tablet Take 500 mg by mouth 2 (two) times daily.        . Cyanocobalamin (VITAMIN B-12) 5000 MCG SUBL Place 1 tablet under the tongue daily.        . dabigatran (PRADAXA) 150 MG CAPS Take 150 mg by mouth every 12 (twelve) hours.        . ergocalciferol (VITAMIN D2) 50000 UNITS capsule Take 50,000 Units by mouth as directed.        . ferrous sulfate (IRON SUPPLEMENT) 325 (65 FE) MG tablet Take 325 mg by mouth daily with breakfast.        . finasteride (PROSCAR) 5 MG tablet Take 5 mg by mouth daily.        . metoprolol (TOPROL-XL) 50 MG 24 hr tablet Take 75 mg by mouth daily.        . potassium phosphate, monobasic, (K-PHOS ORIGINAL) 500 MG tablet Take 1,250 mg by mouth daily.        Marland Kitchen sulfamethoxazole-trimethoprim (BACTRIM DS) 800-160 MG per tablet Take 1 tablet by mouth 2 (two) times  daily.          Allergies  Allergen Reactions  . Primidone     History   Social History  . Marital Status: Single    Spouse Name: N/A    Number of Children: N/A  . Years of Education: N/A   Occupational History  . Retired: [employer]    Social History Main Topics  . Smoking status: Never Smoker   . Smokeless tobacco: Not on file  . Alcohol Use: No  . Drug Use: No  . Sexually Active: Not on file   Other Topics Concern  . 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 Colon cancer Maternal Uncle   . Diabetes Maternal Aunt     Physical Exam: Filed Vitals:   12/10/10 1617  BP: 92/60  Resp: 16  Height: 5\' 8"  (1.727 m)  Weight: 145 lb (65.772 kg)  GEN- The patient is well appearing, alert and oriented x 3 today.   Head- normocephalic, atraumatic Eyes-  Sclera clear, conjunctiva pink Ears- hearing intact Oropharynx- clear Neck- supple, no JVP Lymph- no cervical lymphadenopathy Lungs- Clear to ausculation bilaterally, normal work of breathing Heart- irregular rate and rhythm, no murmurs, rubs or gallops, PMI not laterally displaced GI- soft, NT, ND, + BS Extremities- no clubbing, cyanosis, trace edema MS- no significant deformity or atrophy Skin- no rash or lesion Psych- euthymic mood, full affect Neuro- strength and sensation are intact  ekg today reveals afib, V rate 100 bpm, RBBB  Assessment and Plan:

## 2010-12-10 NOTE — Assessment & Plan Note (Signed)
Stable No change required today  V rate is slightly elevated, however I am reluctant to adjust medicines with low blood pressure. Hydration is advised We will check BMET/ CBC today as he is on pradaxa.  If Creatinine clearance is <30, we may need to adjust pradaxa dose.

## 2011-02-14 LAB — CBC
HCT: 37.2 — ABNORMAL LOW
Hemoglobin: 12.7 — ABNORMAL LOW
MCHC: 34.3
MCV: 100.2 — ABNORMAL HIGH
RBC: 3.71 — ABNORMAL LOW
WBC: 5.4

## 2011-02-14 LAB — COMPREHENSIVE METABOLIC PANEL
CO2: 25
Calcium: 8.7
Chloride: 100
Creatinine, Ser: 1.15
GFR calc non Af Amer: >60
Glucose, Bld: 120 — ABNORMAL HIGH
Total Bilirubin: 0.7

## 2011-02-14 LAB — DIFFERENTIAL
Basophils Absolute: 0
Eosinophils Absolute: 0.3
Eosinophils Relative: 5
Lymphocytes Relative: 22
Lymphs Abs: 1.2
Neutrophils Relative %: 63

## 2011-04-15 ENCOUNTER — Other Ambulatory Visit: Payer: Self-pay | Admitting: Internal Medicine

## 2011-04-15 MED ORDER — METOPROLOL SUCCINATE ER 50 MG PO TB24: 75.0000 mg | ORAL_TABLET | Freq: Every day | ORAL | Status: DC

## 2011-04-17 ENCOUNTER — Other Ambulatory Visit (HOSPITAL_COMMUNITY): Payer: Self-pay | Admitting: *Deleted

## 2011-04-22 ENCOUNTER — Encounter (HOSPITAL_COMMUNITY): Admission: RE | Admit: 2011-04-22 | Payer: Medicare PPO | Source: Ambulatory Visit

## 2011-07-23 ENCOUNTER — Ambulatory Visit (HOSPITAL_COMMUNITY)
Admission: RE | Admit: 2011-07-23 | Discharge: 2011-07-23 | Disposition: A | Discharge status: Home / Self Care | Payer: Medicare Other | Source: Ambulatory Visit | Attending: Internal Medicine | Admitting: Internal Medicine

## 2011-07-23 ENCOUNTER — Encounter (HOSPITAL_COMMUNITY): Payer: Self-pay

## 2011-07-23 DIAGNOSIS — M810 Age-related osteoporosis without current pathological fracture: Secondary | ICD-10-CM | POA: Insufficient documentation

## 2011-07-23 MED ORDER — ZOLEDRONIC ACID 5 MG/100ML IV SOLN
5.0000 mg | Freq: Once | INTRAVENOUS | Status: AC
  Administered 2011-07-23: 5 mg via INTRAVENOUS
  Filled 2011-07-23: qty 100

## 2011-07-23 MED ORDER — SODIUM CHLORIDE 0.9 % IV SOLN
INTRAVENOUS | Status: AC
  Administered 2011-07-23: 12:00:00 via INTRAVENOUS

## 2011-07-23 NOTE — Discharge Instructions (Signed)
Drink  Fluids/water as tolerated over the next 72 hours Tylenol or ibuprofen OTC as directed Continue Calcium and Vit D as directed by your MD   DISCUSS WITH DR PERINI AS TO WHETHER YOU ARE SUPPOSED TO TAKE CALCIUM OR NOT  PHONE   # [phone]

## 2011-08-05 ENCOUNTER — Ambulatory Visit: Payer: Medicare PPO | Admitting: Internal Medicine

## 2011-10-29 ENCOUNTER — Other Ambulatory Visit: Payer: Self-pay | Admitting: Internal Medicine

## 2011-10-29 DIAGNOSIS — R634 Abnormal weight loss: Secondary | ICD-10-CM

## 2011-11-01 ENCOUNTER — Ambulatory Visit
Admission: RE | Admit: 2011-11-01 | Discharge: 2011-11-01 | Disposition: A | Discharge status: Home / Self Care | Payer: Medicare Other | Source: Ambulatory Visit | Attending: Internal Medicine | Admitting: Internal Medicine

## 2011-11-01 DIAGNOSIS — R634 Abnormal weight loss: Secondary | ICD-10-CM

## 2011-11-01 MED ORDER — IOHEXOL 300 MG/ML  SOLN
100.0000 mL | Freq: Once | INTRAMUSCULAR | Status: AC | PRN
  Administered 2011-11-01: 100 mL via INTRAVENOUS

## 2012-10-13 ENCOUNTER — Other Ambulatory Visit (HOSPITAL_COMMUNITY): Payer: Self-pay | Admitting: Internal Medicine

## 2012-10-13 ENCOUNTER — Encounter (HOSPITAL_COMMUNITY): Payer: Self-pay

## 2012-10-13 ENCOUNTER — Ambulatory Visit (HOSPITAL_COMMUNITY)
Admission: RE | Admit: 2012-10-13 | Discharge: 2012-10-13 | Disposition: A | Discharge status: Home / Self Care | Payer: Medicare Other | Source: Ambulatory Visit | Attending: Internal Medicine | Admitting: Internal Medicine

## 2012-10-13 DIAGNOSIS — M810 Age-related osteoporosis without current pathological fracture: Secondary | ICD-10-CM | POA: Insufficient documentation

## 2012-10-13 HISTORY — DX: Cutaneous abscess of left lower limb: L02.416

## 2012-10-13 MED ORDER — ZOLEDRONIC ACID 5 MG/100ML IV SOLN
5.0000 mg | Freq: Once | INTRAVENOUS | Status: AC
  Administered 2012-10-13: 5 mg via INTRAVENOUS
  Filled 2012-10-13: qty 100

## 2012-10-13 MED ORDER — SODIUM CHLORIDE 0.9 % IV SOLN
Freq: Once | INTRAVENOUS | Status: AC
  Administered 2012-10-13: 15:00:00 via INTRAVENOUS

## 2012-10-13 NOTE — Progress Notes (Signed)
Patient out with staff to car . Home w transporter to [facility].

## 2014-11-02 ENCOUNTER — Encounter: Payer: Self-pay | Admitting: Internal Medicine

## 2014-12-14 ENCOUNTER — Encounter: Payer: Self-pay | Admitting: Internal Medicine

## 2015-01-12 ENCOUNTER — Ambulatory Visit (INDEPENDENT_AMBULATORY_CARE_PROVIDER_SITE_OTHER): Payer: Medicare Other | Admitting: Internal Medicine

## 2015-01-12 ENCOUNTER — Encounter: Payer: Self-pay | Admitting: Internal Medicine

## 2015-01-12 VITALS — BP 116/60 | HR 68 | Ht 66.34 in | Wt 165.2 lb

## 2015-01-12 DIAGNOSIS — D500 Iron deficiency anemia secondary to blood loss (chronic): Secondary | ICD-10-CM | POA: Diagnosis not present

## 2015-01-12 DIAGNOSIS — K5901 Slow transit constipation: Secondary | ICD-10-CM | POA: Diagnosis not present

## 2015-01-12 DIAGNOSIS — R195 Other fecal abnormalities: Secondary | ICD-10-CM | POA: Diagnosis not present

## 2015-01-12 DIAGNOSIS — K2960 Other gastritis without bleeding: Secondary | ICD-10-CM

## 2015-01-12 DIAGNOSIS — Z7901 Long term (current) use of anticoagulants: Secondary | ICD-10-CM

## 2015-01-12 DIAGNOSIS — K2900 Acute gastritis without bleeding: Secondary | ICD-10-CM

## 2015-01-12 DIAGNOSIS — K625 Hemorrhage of anus and rectum: Secondary | ICD-10-CM

## 2015-01-12 NOTE — Patient Instructions (Signed)
   You have been scheduled for a virtual colonoscopy at [facility] [facility] on 01/27/2015 at 9:30am.  Please arrive about 20 minutes early.  You will need to go by their office about 4 days before your test to pick up the prep and instructions.  [facility] is located at [facility].  The phone number is [phone]  We have sent the following medications to your pharmacy for you to pick up at your convenience:  Omeprazole  Use Miralax (Glycolax) daily - 1 scoop in water or juice

## 2015-01-12 NOTE — Progress Notes (Signed)
HISTORY OF PRESENT ILLNESS:  Gerald Sandoval is a delightful 81 y.o. male who is sent by Dr. Crist Infante for evaluation of a chief complaint of Hemoccult-positive stool. I have seen the patient recently for a history of adenomatous colon polyps. Index colonoscopy October 2004 with high-grade dysplasia. Follow-up colonoscopy January 2011 with 3 diminutive polyps found to be both tubular adenomas and hyperplastic. Also, diverticulosis. No further follow-up recommended due to age. Patient has chronic atrial fibrillation for which he is on Pradaxa. Patient also has a history of dysphagia secondary to esophageal stricture. Upper endoscopy in January 2011 revealed esophageal stricture, hiatal hernia, and multiple antral erosions. Testing for Helicobacter pylori was negative. He is not on PPI. Review of outside office note states that the patient has anemia, though I do not have his blood count or MCV. He is on iron. The patient's only GI complaint is that of chronic unchanged constipation. This can be problematic at times. He is interested in treatment options in addition to stool softeners. Patient denies abdominal pain, change in bowel habits, rectal bleeding, or melena.  REVIEW OF SYSTEMS:  All non-GI ROS negative except for arthritis, hearing impairment  Past Medical History  Diagnosis Date  . Permanent atrial fibrillation   . Osteoarthritis   . Gastritis   . Gastric ulcer   . History of colonic polyps   . Dysphagia     unspecified  . Emphysema   . Diastolic dysfunction   . BPH (benign prostatic hyperplasia)   . Rotator cuff injury   . Hernia     Rt. Inguinial  . RBBB (right bundle branch block)   . Kidney stones   . Osteoporosis   . Abscess of left hip     infection left hip  . Hypogonadism in male   . Anemia   . Colon polyp     Past Surgical History  Procedure Laterality Date  . Transurethral resection of prostate    . Inguinal hernia repair      RT  . Total hip arthroplasty     Both hips  . Resection of ribs      Social History Gerald Sandoval  reports that he has never smoked. He has never used smokeless tobacco. He reports that he does not drink alcohol or use illicit drugs.  family history includes Colon cancer in his maternal uncle; Diabetes in his maternal aunt.  Allergies  Allergen Reactions  . Primidone        PHYSICAL EXAMINATION: Vital signs: BP 116/60 mmHg  Pulse 68  Ht 5' 6.34" (1.685 m)  Wt 165 lb 4 oz (74.957 kg)  BMI 26.40 kg/m2 General: Elderly male, Well-developed, well-nourished, no acute distress HEENT: Sclerae are anicteric, conjunctiva pink. Oral mucosa intact Lungs: Clear Heart: Regular Abdomen: soft, nontender, nondistended, no obvious ascites, no peritoneal signs, normal bowel sounds. No organomegaly. Extremities: No clubbing cyanosis or edema Skin: No angiodysplastic lesions Neuro: Heart hearing. No focal deficits Psychiatric: alert and oriented x3. Cooperative   ASSESSMENT:  #1. Hemoccult-positive stool of uncertain clinical significance in an 81 year old on chronic anticoagulation. Potentially important causes for Hemoccult-positive stool include erosive or ulcerative disease of the upper GI tract, small bowel AVMs, or colon cancer. Empiric treatment or assessment of these entities as outlined below #2. Anemia. Unspecified. Question related to chronic GI blood loss in some fashion #3. History of adenomatous colon polyps 2004 and 2011. Most recent exam with diminutive adenomas only #4. Constipation. Chronic and ongoing #5. Advanced age  and multiple medical problems. Chronic anticoagulation. High risk for endoscopic procedures despite his excellent functional status for age #45. History of esophageal stricture status post dilation. Dysphagia resolved #7. History of multiple antral erosions. Helicobacter pylori negative. Not on PPI  PLAN:  #1. Schedule virtual colonoscopy to rule out significant mucosal abnormalities of the  colon contributing to Hemoccult-positive stool. Unlikely given results of most recent colonoscopy, though did have high-grade dysplasia on index exam in 2004. This as an alternative to optical colonoscopy given associated risks of invasive procedure. However, significant abnormalities on virtual colonoscopy found, then examination may be justified after thorough and balanced discussion with the patient #2. Continue iron therapy. This would address the possibility of occult bleeding AVMs or nonspecific mucosal oozing from Pradaxa #3. Prescribe omeprazole 40 mg daily. This to protect upper GI mucosa. Particular important given the history of erosive gastritis. This is the most likely cause for his heme-positive stool. No need to repeat upper endoscopy #4. Recommended MiraLAX daily for constipation. Titrated to need. Advised. #5. Ongoing general medical care with Dr. Joylene Draft  A copy of this consultation note has been sent to Dr. Joylene Draft

## 2015-01-27 ENCOUNTER — Ambulatory Visit
Admission: RE | Admit: 2015-01-27 | Discharge: 2015-01-27 | Disposition: A | Discharge status: Home / Self Care | Payer: Medicare Other | Source: Ambulatory Visit | Attending: Internal Medicine | Admitting: Internal Medicine

## 2015-01-27 DIAGNOSIS — K625 Hemorrhage of anus and rectum: Secondary | ICD-10-CM

## 2015-02-14 ENCOUNTER — Other Ambulatory Visit (HOSPITAL_COMMUNITY): Payer: Self-pay | Admitting: Internal Medicine

## 2015-02-14 ENCOUNTER — Encounter (HOSPITAL_COMMUNITY): Payer: Self-pay

## 2015-02-14 ENCOUNTER — Ambulatory Visit (HOSPITAL_COMMUNITY)
Admission: RE | Admit: 2015-02-14 | Discharge: 2015-02-14 | Disposition: A | Discharge status: Home / Self Care | Payer: Medicare Other | Source: Ambulatory Visit | Attending: Internal Medicine | Admitting: Internal Medicine

## 2015-02-14 DIAGNOSIS — M810 Age-related osteoporosis without current pathological fracture: Secondary | ICD-10-CM | POA: Insufficient documentation

## 2015-02-14 MED ORDER — SODIUM CHLORIDE 0.9 % IV SOLN
Freq: Once | INTRAVENOUS | Status: AC
  Administered 2015-02-14: 14:00:00 via INTRAVENOUS

## 2015-02-14 MED ORDER — ZOLEDRONIC ACID 5 MG/100ML IV SOLN
5.0000 mg | Freq: Once | INTRAVENOUS | Status: AC
  Administered 2015-02-14: 5 mg via INTRAVENOUS
  Filled 2015-02-14: qty 100

## 2015-02-14 NOTE — Discharge Instructions (Signed)

## 2015-06-25 ENCOUNTER — Inpatient Hospital Stay (HOSPITAL_COMMUNITY): Payer: Medicare Other

## 2015-06-25 ENCOUNTER — Emergency Department (HOSPITAL_COMMUNITY): Payer: Medicare Other

## 2015-06-25 ENCOUNTER — Encounter (HOSPITAL_COMMUNITY): Payer: Self-pay | Admitting: Emergency Medicine

## 2015-06-25 ENCOUNTER — Inpatient Hospital Stay (HOSPITAL_COMMUNITY)
Admission: EM | Admit: 2015-06-25 | Discharge: 2015-07-03 | DRG: 853 | Disposition: A | Discharge status: Skilled Nursing Facility | Payer: Medicare Other | Attending: Internal Medicine | Admitting: Internal Medicine

## 2015-06-25 DIAGNOSIS — N400 Enlarged prostate without lower urinary tract symptoms: Secondary | ICD-10-CM | POA: Diagnosis present

## 2015-06-25 DIAGNOSIS — Z79899 Other long term (current) drug therapy: Secondary | ICD-10-CM

## 2015-06-25 DIAGNOSIS — I481 Persistent atrial fibrillation: Secondary | ICD-10-CM | POA: Diagnosis not present

## 2015-06-25 DIAGNOSIS — Z66 Do not resuscitate: Secondary | ICD-10-CM | POA: Diagnosis present

## 2015-06-25 DIAGNOSIS — E876 Hypokalemia: Secondary | ICD-10-CM | POA: Diagnosis not present

## 2015-06-25 DIAGNOSIS — Z792 Long term (current) use of antibiotics: Secondary | ICD-10-CM

## 2015-06-25 DIAGNOSIS — S72114A Nondisplaced fracture of greater trochanter of right femur, initial encounter for closed fracture: Secondary | ICD-10-CM | POA: Diagnosis not present

## 2015-06-25 DIAGNOSIS — R739 Hyperglycemia, unspecified: Secondary | ICD-10-CM | POA: Diagnosis present

## 2015-06-25 DIAGNOSIS — I519 Heart disease, unspecified: Secondary | ICD-10-CM | POA: Diagnosis not present

## 2015-06-25 DIAGNOSIS — J438 Other emphysema: Secondary | ICD-10-CM | POA: Diagnosis present

## 2015-06-25 DIAGNOSIS — R64 Cachexia: Secondary | ICD-10-CM | POA: Diagnosis present

## 2015-06-25 DIAGNOSIS — I82411 Acute embolism and thrombosis of right femoral vein: Secondary | ICD-10-CM | POA: Diagnosis not present

## 2015-06-25 DIAGNOSIS — S7291XA Unspecified fracture of right femur, initial encounter for closed fracture: Secondary | ICD-10-CM | POA: Diagnosis not present

## 2015-06-25 DIAGNOSIS — D696 Thrombocytopenia, unspecified: Secondary | ICD-10-CM | POA: Diagnosis present

## 2015-06-25 DIAGNOSIS — I82401 Acute embolism and thrombosis of unspecified deep veins of right lower extremity: Secondary | ICD-10-CM

## 2015-06-25 DIAGNOSIS — J439 Emphysema, unspecified: Secondary | ICD-10-CM | POA: Diagnosis present

## 2015-06-25 DIAGNOSIS — I5032 Chronic diastolic (congestive) heart failure: Secondary | ICD-10-CM | POA: Diagnosis present

## 2015-06-25 DIAGNOSIS — R609 Edema, unspecified: Secondary | ICD-10-CM | POA: Diagnosis not present

## 2015-06-25 DIAGNOSIS — I4510 Unspecified right bundle-branch block: Secondary | ICD-10-CM | POA: Diagnosis present

## 2015-06-25 DIAGNOSIS — I4819 Other persistent atrial fibrillation: Secondary | ICD-10-CM | POA: Insufficient documentation

## 2015-06-25 DIAGNOSIS — T796XXA Traumatic ischemia of muscle, initial encounter: Secondary | ICD-10-CM | POA: Diagnosis present

## 2015-06-25 DIAGNOSIS — Z96661 Presence of right artificial ankle joint: Secondary | ICD-10-CM | POA: Diagnosis not present

## 2015-06-25 DIAGNOSIS — I824Y1 Acute embolism and thrombosis of unspecified deep veins of right proximal lower extremity: Secondary | ICD-10-CM | POA: Diagnosis not present

## 2015-06-25 DIAGNOSIS — Z6823 Body mass index (BMI) 23.0-23.9, adult: Secondary | ICD-10-CM | POA: Diagnosis not present

## 2015-06-25 DIAGNOSIS — Z01810 Encounter for preprocedural cardiovascular examination: Secondary | ICD-10-CM | POA: Diagnosis not present

## 2015-06-25 DIAGNOSIS — R278 Other lack of coordination: Secondary | ICD-10-CM | POA: Diagnosis not present

## 2015-06-25 DIAGNOSIS — Z888 Allergy status to other drugs, medicaments and biological substances status: Secondary | ICD-10-CM | POA: Diagnosis not present

## 2015-06-25 DIAGNOSIS — I82409 Acute embolism and thrombosis of unspecified deep veins of unspecified lower extremity: Secondary | ICD-10-CM | POA: Diagnosis not present

## 2015-06-25 DIAGNOSIS — Z96643 Presence of artificial hip joint, bilateral: Secondary | ICD-10-CM | POA: Diagnosis present

## 2015-06-25 DIAGNOSIS — E872 Acidosis, unspecified: Secondary | ICD-10-CM

## 2015-06-25 DIAGNOSIS — S72391D Other fracture of shaft of right femur, subsequent encounter for closed fracture with routine healing: Secondary | ICD-10-CM | POA: Diagnosis not present

## 2015-06-25 DIAGNOSIS — W010XXA Fall on same level from slipping, tripping and stumbling without subsequent striking against object, initial encounter: Secondary | ICD-10-CM | POA: Diagnosis present

## 2015-06-25 DIAGNOSIS — E875 Hyperkalemia: Secondary | ICD-10-CM

## 2015-06-25 DIAGNOSIS — Z7984 Long term (current) use of oral hypoglycemic drugs: Secondary | ICD-10-CM

## 2015-06-25 DIAGNOSIS — M79606 Pain in leg, unspecified: Secondary | ICD-10-CM

## 2015-06-25 DIAGNOSIS — M810 Age-related osteoporosis without current pathological fracture: Secondary | ICD-10-CM | POA: Diagnosis present

## 2015-06-25 DIAGNOSIS — S728X9A Other fracture of unspecified femur, initial encounter for closed fracture: Secondary | ICD-10-CM | POA: Diagnosis not present

## 2015-06-25 DIAGNOSIS — S0191XA Laceration without foreign body of unspecified part of head, initial encounter: Secondary | ICD-10-CM | POA: Diagnosis present

## 2015-06-25 DIAGNOSIS — R4182 Altered mental status, unspecified: Secondary | ICD-10-CM

## 2015-06-25 DIAGNOSIS — Z96649 Presence of unspecified artificial hip joint: Secondary | ICD-10-CM

## 2015-06-25 DIAGNOSIS — Z9889 Other specified postprocedural states: Secondary | ICD-10-CM

## 2015-06-25 DIAGNOSIS — Z96641 Presence of right artificial hip joint: Secondary | ICD-10-CM | POA: Diagnosis not present

## 2015-06-25 DIAGNOSIS — Z5181 Encounter for therapeutic drug level monitoring: Secondary | ICD-10-CM | POA: Diagnosis not present

## 2015-06-25 DIAGNOSIS — Z95828 Presence of other vascular implants and grafts: Secondary | ICD-10-CM

## 2015-06-25 DIAGNOSIS — M9701XA Periprosthetic fracture around internal prosthetic right hip joint, initial encounter: Secondary | ICD-10-CM | POA: Diagnosis not present

## 2015-06-25 DIAGNOSIS — R6520 Severe sepsis without septic shock: Secondary | ICD-10-CM | POA: Diagnosis present

## 2015-06-25 DIAGNOSIS — M1990 Unspecified osteoarthritis, unspecified site: Secondary | ICD-10-CM | POA: Diagnosis present

## 2015-06-25 DIAGNOSIS — R600 Localized edema: Secondary | ICD-10-CM | POA: Diagnosis not present

## 2015-06-25 DIAGNOSIS — E871 Hypo-osmolality and hyponatremia: Secondary | ICD-10-CM | POA: Diagnosis not present

## 2015-06-25 DIAGNOSIS — I482 Chronic atrial fibrillation, unspecified: Secondary | ICD-10-CM | POA: Diagnosis present

## 2015-06-25 DIAGNOSIS — I4891 Unspecified atrial fibrillation: Secondary | ICD-10-CM | POA: Diagnosis not present

## 2015-06-25 DIAGNOSIS — M6281 Muscle weakness (generalized): Secondary | ICD-10-CM | POA: Diagnosis not present

## 2015-06-25 DIAGNOSIS — T148XXA Other injury of unspecified body region, initial encounter: Secondary | ICD-10-CM | POA: Diagnosis present

## 2015-06-25 DIAGNOSIS — D649 Anemia, unspecified: Secondary | ICD-10-CM | POA: Diagnosis not present

## 2015-06-25 DIAGNOSIS — E860 Dehydration: Secondary | ICD-10-CM | POA: Diagnosis present

## 2015-06-25 DIAGNOSIS — Y92129 Unspecified place in nursing home as the place of occurrence of the external cause: Secondary | ICD-10-CM | POA: Diagnosis not present

## 2015-06-25 DIAGNOSIS — T148 Other injury of unspecified body region: Secondary | ICD-10-CM | POA: Diagnosis not present

## 2015-06-25 DIAGNOSIS — H9190 Unspecified hearing loss, unspecified ear: Secondary | ICD-10-CM | POA: Diagnosis present

## 2015-06-25 DIAGNOSIS — Z8781 Personal history of (healed) traumatic fracture: Secondary | ICD-10-CM

## 2015-06-25 DIAGNOSIS — W19XXXA Unspecified fall, initial encounter: Secondary | ICD-10-CM | POA: Diagnosis not present

## 2015-06-25 DIAGNOSIS — D62 Acute posthemorrhagic anemia: Secondary | ICD-10-CM | POA: Diagnosis not present

## 2015-06-25 DIAGNOSIS — N289 Disorder of kidney and ureter, unspecified: Secondary | ICD-10-CM | POA: Diagnosis not present

## 2015-06-25 DIAGNOSIS — Z515 Encounter for palliative care: Secondary | ICD-10-CM | POA: Diagnosis not present

## 2015-06-25 DIAGNOSIS — R410 Disorientation, unspecified: Secondary | ICD-10-CM | POA: Diagnosis present

## 2015-06-25 DIAGNOSIS — A419 Sepsis, unspecified organism: Secondary | ICD-10-CM | POA: Diagnosis not present

## 2015-06-25 DIAGNOSIS — M96661 Fracture of femur following insertion of orthopedic implant, joint prosthesis, or bone plate, right leg: Secondary | ICD-10-CM | POA: Diagnosis not present

## 2015-06-25 DIAGNOSIS — F439 Reaction to severe stress, unspecified: Secondary | ICD-10-CM | POA: Diagnosis present

## 2015-06-25 DIAGNOSIS — R2681 Unsteadiness on feet: Secondary | ICD-10-CM | POA: Diagnosis not present

## 2015-06-25 DIAGNOSIS — Z419 Encounter for procedure for purposes other than remedying health state, unspecified: Secondary | ICD-10-CM

## 2015-06-25 DIAGNOSIS — Z7901 Long term (current) use of anticoagulants: Secondary | ICD-10-CM

## 2015-06-25 DIAGNOSIS — N170 Acute kidney failure with tubular necrosis: Secondary | ICD-10-CM | POA: Diagnosis not present

## 2015-06-25 DIAGNOSIS — S72001A Fracture of unspecified part of neck of right femur, initial encounter for closed fracture: Secondary | ICD-10-CM | POA: Diagnosis not present

## 2015-06-25 DIAGNOSIS — N179 Acute kidney failure, unspecified: Secondary | ICD-10-CM

## 2015-06-25 DIAGNOSIS — R1311 Dysphagia, oral phase: Secondary | ICD-10-CM | POA: Diagnosis not present

## 2015-06-25 LAB — URINALYSIS, ROUTINE W REFLEX MICROSCOPIC
BILIRUBIN URINE: NEGATIVE
Glucose, UA: NEGATIVE mg/dL
Ketones, ur: 15 mg/dL — AB
Leukocytes, UA: NEGATIVE
NITRITE: NEGATIVE
PH: 6 (ref 5.0–8.0)
Protein, ur: NEGATIVE mg/dL
SPECIFIC GRAVITY, URINE: 1.014 (ref 1.005–1.030)

## 2015-06-25 LAB — CBC WITH DIFFERENTIAL/PLATELET
BASOS PCT: 0 %
Basophils Absolute: 0 10*3/uL (ref 0.0–0.1)
Basophils Absolute: 0 10*3/uL (ref 0.0–0.1)
Basophils Relative: 0 %
EOS ABS: 0 10*3/uL (ref 0.0–0.7)
EOS PCT: 0 %
Eosinophils Absolute: 0 10*3/uL (ref 0.0–0.7)
Eosinophils Relative: 0 %
HCT: 22.6 % — ABNORMAL LOW (ref 39.0–52.0)
HEMATOCRIT: 27.1 % — AB (ref 39.0–52.0)
HEMOGLOBIN: 9.3 g/dL — AB (ref 13.0–17.0)
Hemoglobin: 8.1 g/dL — ABNORMAL LOW (ref 13.0–17.0)
LYMPHS ABS: 1 10*3/uL (ref 0.7–4.0)
LYMPHS ABS: 0.8 10*3/uL (ref 0.7–4.0)
LYMPHS PCT: 3 %
Lymphocytes Relative: 4 %
MCH: 33.9 pg (ref 26.0–34.0)
MCH: 34.2 pg — AB (ref 26.0–34.0)
MCHC: 34.3 g/dL (ref 30.0–36.0)
MCHC: 35.8 g/dL (ref 30.0–36.0)
MCV: 98.9 fL (ref 78.0–100.0)
MCV: 95.4 fL (ref 78.0–100.0)
MONO ABS: 1.4 10*3/uL — AB (ref 0.1–1.0)
MONOS PCT: 3 %
MONOS PCT: 6 %
Monocytes Absolute: 0.8 10*3/uL (ref 0.1–1.0)
NEUTROS ABS: 22 10*3/uL — AB (ref 1.7–7.7)
NEUTROS PCT: 93 %
Neutro Abs: 20.3 10*3/uL — ABNORMAL HIGH (ref 1.7–7.7)
Neutrophils Relative %: 91 %
PLATELETS: 123 10*3/uL — AB (ref 150–400)
Platelets: 141 10*3/uL — ABNORMAL LOW (ref 150–400)
RBC: 2.74 MIL/uL — AB (ref 4.22–5.81)
RBC: 2.37 MIL/uL — ABNORMAL LOW (ref 4.22–5.81)
RDW: 13.5 % (ref 11.5–15.5)
RDW: 13.2 % (ref 11.5–15.5)
WBC: 23.8 10*3/uL — AB (ref 4.0–10.5)
WBC: 22.5 10*3/uL — AB (ref 4.0–10.5)

## 2015-06-25 LAB — I-STAT ARTERIAL BLOOD GAS, ED
Acid-base deficit: 17 mmol/L — ABNORMAL HIGH (ref 0.0–2.0)
Acid-base deficit: 2 mmol/L (ref 0.0–2.0)
BICARBONATE: 21.1 meq/L (ref 20.0–24.0)
Bicarbonate: 7.3 mEq/L — ABNORMAL LOW (ref 20.0–24.0)
O2 SAT: 99 %
O2 SAT: 99 %
PCO2 ART: 15.6 mmHg — AB (ref 35.0–45.0)
PCO2 ART: 28 mmHg — AB (ref 35.0–45.0)
PO2 ART: 130 mmHg — AB (ref 80.0–100.0)
TCO2: 8 mmol/L (ref 0–100)
TCO2: 22 mmol/L (ref 0–100)
pH, Arterial: 7.279 — ABNORMAL LOW (ref 7.350–7.450)
pH, Arterial: 7.485 — ABNORMAL HIGH (ref 7.350–7.450)
pO2, Arterial: 142 mmHg — ABNORMAL HIGH (ref 80.0–100.0)

## 2015-06-25 LAB — COMPREHENSIVE METABOLIC PANEL
ALBUMIN: 2.8 g/dL — AB (ref 3.5–5.0)
ALK PHOS: 45 U/L (ref 38–126)
ALT: 20 U/L (ref 17–63)
ALT: 20 U/L (ref 17–63)
ANION GAP: 11 (ref 5–15)
AST: 39 U/L (ref 15–41)
AST: 43 U/L — AB (ref 15–41)
AST: 47 U/L — ABNORMAL HIGH (ref 15–41)
Albumin: 2.9 g/dL — ABNORMAL LOW (ref 3.5–5.0)
Albumin: 2.6 g/dL — ABNORMAL LOW (ref 3.5–5.0)
Alkaline Phosphatase: 37 U/L — ABNORMAL LOW (ref 38–126)
Alkaline Phosphatase: 33 U/L — ABNORMAL LOW (ref 38–126)
Anion gap: 25 — ABNORMAL HIGH (ref 5–15)
Anion gap: 8 (ref 5–15)
BILIRUBIN TOTAL: 0.5 mg/dL (ref 0.3–1.2)
BILIRUBIN TOTAL: 0.4 mg/dL (ref 0.3–1.2)
BILIRUBIN TOTAL: 0.3 mg/dL (ref 0.3–1.2)
BUN: 14 mg/dL (ref 6–20)
BUN: 15 mg/dL (ref 6–20)
BUN: 15 mg/dL (ref 6–20)
CHLORIDE: 93 mmol/L — AB (ref 101–111)
CHLORIDE: 98 mmol/L — AB (ref 101–111)
CO2: 11 mmol/L — ABNORMAL LOW (ref 22–32)
CO2: 25 mmol/L (ref 22–32)
CO2: 25 mmol/L (ref 22–32)
CREATININE: 1.77 mg/dL — AB (ref 0.61–1.24)
Calcium: 8.4 mg/dL — ABNORMAL LOW (ref 8.9–10.3)
Calcium: 7.7 mg/dL — ABNORMAL LOW (ref 8.9–10.3)
Calcium: 8 mg/dL — ABNORMAL LOW (ref 8.9–10.3)
Chloride: 95 mmol/L — ABNORMAL LOW (ref 101–111)
Creatinine, Ser: 2.26 mg/dL — ABNORMAL HIGH (ref 0.61–1.24)
Creatinine, Ser: 1.66 mg/dL — ABNORMAL HIGH (ref 0.61–1.24)
GFR, EST AFRICAN AMERICAN: 28 mL/min — AB (ref >60)
GFR, EST AFRICAN AMERICAN: 37 mL/min — AB (ref >60)
GFR, EST AFRICAN AMERICAN: 41 mL/min — AB (ref >60)
GFR, EST NON AFRICAN AMERICAN: 24 mL/min — AB (ref >60)
GFR, EST NON AFRICAN AMERICAN: 32 mL/min — AB (ref >60)
GFR, EST NON AFRICAN AMERICAN: 35 mL/min — AB (ref >60)
GLUCOSE: 275 mg/dL — AB (ref 65–99)
Glucose, Bld: 200 mg/dL — ABNORMAL HIGH (ref 65–99)
Glucose, Bld: 168 mg/dL — ABNORMAL HIGH (ref 65–99)
POTASSIUM: 5.3 mmol/L — AB (ref 3.5–5.1)
POTASSIUM: 4.8 mmol/L (ref 3.5–5.1)
Potassium: 4.4 mmol/L (ref 3.5–5.1)
Sodium: 129 mmol/L — ABNORMAL LOW (ref 135–145)
Sodium: 131 mmol/L — ABNORMAL LOW (ref 135–145)
Sodium: 131 mmol/L — ABNORMAL LOW (ref 135–145)
TOTAL PROTEIN: 4.5 g/dL — AB (ref 6.5–8.1)
TOTAL PROTEIN: 4.6 g/dL — AB (ref 6.5–8.1)
Total Protein: 4.9 g/dL — ABNORMAL LOW (ref 6.5–8.1)

## 2015-06-25 LAB — CBC
HCT: 22.4 % — ABNORMAL LOW (ref 39.0–52.0)
HEMATOCRIT: 22 % — AB (ref 39.0–52.0)
Hemoglobin: 8.1 g/dL — ABNORMAL LOW (ref 13.0–17.0)
Hemoglobin: 7.9 g/dL — ABNORMAL LOW (ref 13.0–17.0)
MCH: 34.2 pg — AB (ref 26.0–34.0)
MCH: 34.1 pg — ABNORMAL HIGH (ref 26.0–34.0)
MCHC: 36.2 g/dL — ABNORMAL HIGH (ref 30.0–36.0)
MCHC: 35.9 g/dL (ref 30.0–36.0)
MCV: 94.5 fL (ref 78.0–100.0)
MCV: 94.8 fL (ref 78.0–100.0)
PLATELETS: 115 10*3/uL — AB (ref 150–400)
Platelets: 121 10*3/uL — ABNORMAL LOW (ref 150–400)
RBC: 2.37 MIL/uL — AB (ref 4.22–5.81)
RBC: 2.32 MIL/uL — ABNORMAL LOW (ref 4.22–5.81)
RDW: 13.2 % (ref 11.5–15.5)
RDW: 13.4 % (ref 11.5–15.5)
WBC: 21.1 10*3/uL — ABNORMAL HIGH (ref 4.0–10.5)
WBC: 24.4 10*3/uL — ABNORMAL HIGH (ref 4.0–10.5)

## 2015-06-25 LAB — I-STAT CG4 LACTIC ACID, ED
LACTIC ACID, VENOUS: 14.89 mmol/L — AB (ref 0.5–2.0)
LACTIC ACID, VENOUS: 7.64 mmol/L — AB (ref 0.5–2.0)

## 2015-06-25 LAB — URINE MICROSCOPIC-ADD ON
Bacteria, UA: NONE SEEN
RBC / HPF: NONE SEEN RBC/hpf (ref 0–5)
SQUAMOUS EPITHELIAL / LPF: NONE SEEN

## 2015-06-25 LAB — LACTIC ACID, PLASMA
LACTIC ACID, VENOUS: 3.2 mmol/L — AB (ref 0.5–2.0)
LACTIC ACID, VENOUS: 2.9 mmol/L — AB (ref 0.5–2.0)

## 2015-06-25 LAB — PROTIME-INR
INR: 1.97 — ABNORMAL HIGH (ref 0.00–1.49)
PROTHROMBIN TIME: 22.3 s — AB (ref 11.6–15.2)

## 2015-06-25 LAB — MRSA PCR SCREENING: MRSA by PCR: NEGATIVE

## 2015-06-25 LAB — APTT: aPTT: 51 seconds — ABNORMAL HIGH (ref 24–37)

## 2015-06-25 LAB — TSH: TSH: 4.702 u[IU]/mL — ABNORMAL HIGH (ref 0.350–4.500)

## 2015-06-25 LAB — CBG MONITORING, ED: GLUCOSE-CAPILLARY: 236 mg/dL — AB (ref 65–99)

## 2015-06-25 LAB — CK: CK TOTAL: 444 U/L — AB (ref 49–397)

## 2015-06-25 LAB — TROPONIN I: Troponin I: 0.04 ng/mL — ABNORMAL HIGH (ref <0.031)

## 2015-06-25 LAB — PROCALCITONIN: Procalcitonin: 3.65 ng/mL

## 2015-06-25 MED ORDER — SODIUM BICARBONATE 8.4 % IV SOLN
INTRAVENOUS | Status: AC
  Administered 2015-06-25: 10:00:00 via INTRAVENOUS
  Filled 2015-06-25: qty 150

## 2015-06-25 MED ORDER — SODIUM CHLORIDE 0.9 % IV SOLN
1500.0000 mg | Freq: Once | INTRAVENOUS | Status: AC
  Administered 2015-06-25: 1500 mg via INTRAVENOUS
  Filled 2015-06-25: qty 1500

## 2015-06-25 MED ORDER — ONDANSETRON HCL 4 MG/2ML IJ SOLN: 4.0000 mg | Freq: Four times a day (QID) | INTRAMUSCULAR | Status: DC | PRN

## 2015-06-25 MED ORDER — ACETAMINOPHEN 325 MG PO TABS: 650.0000 mg | ORAL_TABLET | Freq: Four times a day (QID) | ORAL | Status: DC | PRN

## 2015-06-25 MED ORDER — HEPARIN SODIUM (PORCINE) 5000 UNIT/ML IJ SOLN
5000.0000 [IU] | Freq: Three times a day (TID) | INTRAMUSCULAR | Status: DC
  Administered 2015-06-25 – 2015-06-28 (×8): 5000 [IU] via SUBCUTANEOUS
  Filled 2015-06-25 (×8): qty 1

## 2015-06-25 MED ORDER — HYDROCODONE-ACETAMINOPHEN 5-325 MG PO TABS
1.0000 | ORAL_TABLET | ORAL | Status: DC | PRN
  Administered 2015-06-30: 1 via ORAL

## 2015-06-25 MED ORDER — SODIUM CHLORIDE 0.9 % IV SOLN
INTRAVENOUS | Status: DC
  Administered 2015-06-25: 13:00:00 via INTRAVENOUS

## 2015-06-25 MED ORDER — ONDANSETRON HCL 4 MG PO TABS: 4.0000 mg | ORAL_TABLET | Freq: Four times a day (QID) | ORAL | Status: DC | PRN

## 2015-06-25 MED ORDER — SODIUM CHLORIDE 0.9 % IV SOLN: INTRAVENOUS | Status: DC

## 2015-06-25 MED ORDER — HYDROCODONE-ACETAMINOPHEN 5-325 MG PO TABS: 1.0000 | ORAL_TABLET | ORAL | Status: DC | PRN

## 2015-06-25 MED ORDER — PIPERACILLIN-TAZOBACTAM 3.375 G IVPB
3.3750 g | Freq: Three times a day (TID) | INTRAVENOUS | Status: DC
  Administered 2015-06-25 – 2015-07-01 (×17): 3.375 g via INTRAVENOUS
  Filled 2015-06-25 (×21): qty 50

## 2015-06-25 MED ORDER — PIPERACILLIN-TAZOBACTAM 3.375 G IVPB 30 MIN
3.3750 g | Freq: Once | INTRAVENOUS | Status: AC
  Administered 2015-06-25: 3.375 g via INTRAVENOUS
  Filled 2015-06-25: qty 50

## 2015-06-25 MED ORDER — SODIUM CHLORIDE 0.9 % IV BOLUS (SEPSIS)
1000.0000 mL | Freq: Once | INTRAVENOUS | Status: AC
  Administered 2015-06-25: 1000 mL via INTRAVENOUS

## 2015-06-25 MED ORDER — ACETAMINOPHEN 650 MG RE SUPP: 650.0000 mg | Freq: Four times a day (QID) | RECTAL | Status: DC | PRN

## 2015-06-25 MED ORDER — SODIUM BICARBONATE 8.4 % IV SOLN: Freq: Once | INTRAVENOUS | Status: DC

## 2015-06-25 MED ORDER — VANCOMYCIN HCL IN DEXTROSE 1-5 GM/200ML-% IV SOLN: 1000.0000 mg | INTRAVENOUS | Status: DC

## 2015-06-25 MED ORDER — VANCOMYCIN HCL IN DEXTROSE 750-5 MG/150ML-% IV SOLN
750.0000 mg | INTRAVENOUS | Status: DC
  Administered 2015-06-26 – 2015-06-27 (×2): 750 mg via INTRAVENOUS
  Filled 2015-06-25 (×2): qty 150

## 2015-06-25 MED ORDER — SODIUM CHLORIDE 0.9% FLUSH
3.0000 mL | Freq: Two times a day (BID) | INTRAVENOUS | Status: DC
  Administered 2015-06-25 – 2015-06-30 (×9): 3 mL via INTRAVENOUS

## 2015-06-25 MED ORDER — PIPERACILLIN-TAZOBACTAM IN DEX 2-0.25 GM/50ML IV SOLN
2.2500 g | Freq: Three times a day (TID) | INTRAVENOUS | Status: DC
  Filled 2015-06-25 (×2): qty 50

## 2015-06-25 MED ORDER — HYDROMORPHONE HCL 1 MG/ML IJ SOLN: 0.5000 mg | INTRAMUSCULAR | Status: DC | PRN

## 2015-06-25 NOTE — Progress Notes (Signed)
ABG results given to Dr. Laneta Simmers.

## 2015-06-25 NOTE — Progress Notes (Signed)
Pharmacy Code Sepsis Protocol  Time of code sepsis page: 0902 []  Antibiotics delivered at  [x]  Antibiotics administered prior to code at 0858  Were antibiotics ordered at the time of the code sepsis page? Yes Was it required to contact the physician? []  Physician not contacted []  Physician contacted to order antibiotics for code sepsis []  Physician contacted to recommend changing antibiotics  Pharmacy consulted for: N/A at this time  Anti-infectives    Start     Dose/Rate Route Frequency Ordered Stop   06/25/15 0930  vancomycin (VANCOCIN) 1,500 mg in sodium chloride 0.9 % 500 mL IVPB     1,500 mg 250 mL/hr over 120 Minutes Intravenous  Once 06/25/15 0849     06/25/15 0900  piperacillin-tazobactam (ZOSYN) IVPB 3.375 g     3.375 g 100 mL/hr over 30 Minutes Intravenous  Once 06/25/15 0849          Nurse education provided: []  Minutes left to administer antibiotics to achieve 1 hour goal []  Correct order of antibiotic administration []  Antibiotic Y-site compatibilities     Trenton Passow, Jake Church, PharmD 06/25/2015, 9:02 AM

## 2015-06-25 NOTE — Progress Notes (Addendum)
ANTIBIOTIC CONSULT NOTE - INITIAL  Pharmacy Consult for vancomycin/Zosyn Indication: rule out sepsis  Allergies  Allergen Reactions  . Primidone     Patient Measurements:   **Using old weight of 73.5 kg from 02/14/15 for dosing**  Vital Signs: Temp: 98.9 F (37.2 C) (01/29 0820) Temp Source: Oral (01/29 0820) BP: 129/86 mmHg (01/29 1215) Pulse Rate: 76 (01/29 1215) Intake/Output from previous day:   Intake/Output from this shift: Total I/O In: 4500 [I.V.:4500] Out: 600 [Urine:600]  Labs:  Recent Labs  06/25/15 0832  WBC 23.8*  HGB 9.3*  PLT 141*  CREATININE 2.26*   CrCl cannot be calculated (Unknown ideal weight.). No results for input(s): VANCOTROUGH, VANCOPEAK, VANCORANDOM, GENTTROUGH, GENTPEAK, GENTRANDOM, TOBRATROUGH, TOBRAPEAK, TOBRARND, AMIKACINPEAK, AMIKACINTROU, AMIKACIN in the last 72 hours.   Microbiology: No results found for this or any previous visit (from the past 720 hour(s)).  Medical History: Past Medical History  Diagnosis Date  . Permanent atrial fibrillation ([place])   . Osteoarthritis   . Gastritis   . Gastric ulcer   . History of colonic polyps   . Dysphagia     unspecified  . Emphysema   . Diastolic dysfunction   . BPH (benign prostatic hyperplasia)   . Rotator cuff injury   . Hernia     Rt. Inguinial  . RBBB (right bundle branch block)   . Kidney stones   . Osteoporosis   . Abscess of left hip     infection left hip  . Hypogonadism in male   . Anemia   . Colon polyp     Medications:   (Not in a hospital admission) Assessment: 82 yo M presents after being found down for unknown period of time. Tachycardic to 160s, dehydrated and confused at presentation, low BP with EMS. Possible chronic right hip replacement infection per family friend. Pharmacy consulted to dose vancomycin/zosyn for sepsis.  WBC 23.8, SCr 2.26, eCrCl ~20 (likely AKI from dehydration), LA 14  1/29 Vanc >> 1/29 Zosyn >>  1/29 UCx > 1/29 BCx x2  >>  Goal of Therapy:  Vancomycin trough level 15-20 mcg/ml  Plan:  Vancomycin 1500 mg IV x1, then 1000 q48h Zosyn 3.375 g x1, then 2.25 g q8h Expected duration 5 days with resolution of temperature and/or normalization of WBC Measure antibiotic drug levels at steady state Follow up culture results, LOT, clinical improvement  - Unable to reach nurse to obtain new weight; **using old weight of 73.5 kg from 02/14/15 for dosing**; doses will need adjusted when accurate weight available  Governor Specking, PharmD Clinical Pharmacy Resident Pager: [phone] 06/25/2015,1:48 PM   Addendum: Actual weight is 69.5kg. Follow up BMET has resulted - renal function improved with sCr down to 1.77 and CrCl ~ 38mlmin.  Plan: Change vancomycin to 750mg  IV q24 Change zosyn to 3.375g IV q8  Nena Jordan, PharmD, BCPS 06/25/2015, 4:31 PM

## 2015-06-25 NOTE — ED Notes (Signed)
Patient transported to CT 

## 2015-06-25 NOTE — H&P (Signed)
[facility] History and Physical  Gerald Sandoval [id number] DOB: 12-17-1923 DOA: 06/25/2015  Referring physician: Emergency Department PCP: Gerald Ly, MD   CHIEF COMPLAINT:  Severe sepsis                 HPI: Gerald Sandoval is a 82 y.o. male  brought to the emergency department after being found down at home. Patient lives at [facility] for the seizures independent) disease. He spoke with his girlfriend in [place] on the phone yesterday morning. Patient's girlfriend was unable to get in touch with him later in the day and subsequently called [facility] to ask someone check on the patient. Apparently patient was checked on by staff this morning and was found on the floor at home. There was some dried blood on his head and on the floor under his head .  Patient apparently has no immediate family members. For years he has been cared for by three friends. One friend is at bedside now. 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 He and his assistant Gerald Sandoval know patient well and help with management of his medications and some financial affairs. The third friend/caregiver is also the power of attorney and his name is Gerald Sandoval.  This is patient's second recent fall, he did not notify anyone about the first fall. Patient enjoys fairly independent life.   ED course:   3 L of normal saline EDP spoke with critical care medicine. CCM does not plan on seeing the patient in consultation at this point but agrees with [facility] admission and treatment.        Labs:   Lactic acid 14, CO2 11,  anion gap 25 sodium 129, potassium 5.3, BUN 14, creatinine 2.26. Glucose 275 CK 444, troponin 0.04 Wbc 23, hemoglobin 9.3, platelets 141 TSH 4.7  ABGs: PH 7.27, bicarbonate 7.3, PCO2 15.6  Urinalysis:    Clear, hyaline casts, negative leukocytes, negative nitrites             CXR:    No acute abdomen allergies            EKG:    Atrial tachycardia RBBB and LAFB Artifact in lead(s) I II III aVR aVL aVF V1 V3 V4 V5 V6 Interpretation limited secondary to artifact  QTC 475                Medications  vancomycin (VANCOCIN) 1,500 mg in sodium chloride 0.9 % 500 mL IVPB (1,500 mg Intravenous New Bag/Given 06/25/15 0947)  0.9 %  sodium chloride infusion (not administered)  sodium chloride 0.9 % bolus 1,000 mL (0 mLs Intravenous Stopped 06/25/15 0930)  sodium chloride 0.9 % bolus 1,000 mL (0 mLs Intravenous Stopped 06/25/15 0930)  piperacillin-tazobactam (ZOSYN) IVPB 3.375 g (0 g Intravenous Stopped 06/25/15 0930)  sodium bicarbonate 150 mEq in dextrose 5 % 1,000 mL infusion ( Intravenous Stopped 06/25/15 1055)    Review of Systems  Unable to perform ROS: acuity of condition   Past Medical History  Diagnosis Date  . Permanent atrial fibrillation ([place])   . Osteoarthritis   . Gastritis   . Gastric ulcer   . History of colonic polyps   . Dysphagia     unspecified  . Emphysema   . Diastolic dysfunction   . BPH (benign prostatic hyperplasia)   . Rotator cuff injury   . Hernia     Rt. Inguinial  . RBBB (  right bundle branch block)   . Kidney stones   . Osteoporosis   . Abscess of left hip     infection left hip  . Hypogonadism in male   . Anemia   . Colon polyp    Past Surgical History  Procedure Laterality Date  . Transurethral resection of prostate    . Inguinal hernia repair      RT  . Total hip arthroplasty      Both hips  . Resection of ribs      SOCIAL HISTORY:  reports that he has never smoked. He has never used smokeless tobacco. He reports that he does not drink alcohol or use illicit drugs. Lives: at [facility] (independent living)     Assistive devices:   None needed for ambulation.   Allergies  Allergen Reactions  . Primidone     Family History  Problem Relation Age of Onset  . Colon cancer Maternal Uncle   . Diabetes Maternal Aunt      Prior to Admission medications    Medication Sig Start Date End Date Taking? Authorizing Provider  amoxicillin (AMOXIL) 500 MG tablet Take 500 mg by mouth 2 (two) times daily.     Yes Historical Provider, MD  Cyanocobalamin (VITAMIN B-12) 5000 MCG SUBL Place 1 tablet under the tongue daily.     Yes Historical Provider, MD  metoprolol (TOPROL-XL) 50 MG 24 hr tablet Take 1.5 tablets (75 mg total) by mouth daily. 04/15/11  Yes Thompson Grayer, MD  sulfamethoxazole-trimethoprim (BACTRIM DS) 800-160 MG per tablet Take 1 tablet by mouth 2 (two) times daily.     Yes Historical Provider, MD  tamsulosin (FLOMAX) 0.4 MG CAPS capsule Take 0.4 mg by mouth.   Yes Historical Provider, MD  dabigatran (PRADAXA) 150 MG CAPS Take 150 mg by mouth every 12 (twelve) hours.      Historical Provider, MD  ergocalciferol (VITAMIN D2) 50000 UNITS capsule Take 50,000 Units by mouth as directed.      Historical Provider, MD  ferrous sulfate (IRON SUPPLEMENT) 325 (65 FE) MG tablet Take 325 mg by mouth daily with breakfast.      Historical Provider, MD  finasteride (PROSCAR) 5 MG tablet Take 5 mg by mouth daily.      Historical Provider, MD  potassium phosphate, monobasic, (K-PHOS ORIGINAL) 500 MG tablet Take 1,250 mg by mouth daily.      Historical Provider, MD   PHYSICAL EXAM: Filed Vitals:   06/25/15 1000 06/25/15 1015 06/25/15 1030 06/25/15 1045  BP: 146/81 152/82 164/84 147/72  Pulse: 67 128 62 126  Temp:      TempSrc:      Resp: 20 30 21 24   SpO2: 100% 100% 100% 100%    Wt Readings from Last 3 Encounters:  02/14/15 73.483 kg (162 lb)  01/12/15 74.957 kg (165 lb 4 oz)  12/10/10 65.772 kg (145 lb)    General:  Pleasant  thin, white male. Appears calm and comfortable Eyes: PER, normal lids, irises & conjunctiva Head: Dried blood in back of head. There is a gash located in right occipital area which is crusted over with dried blood ENT: Hard of hearing, mucous membranes dry  Neck: no LAD, no masses Cardiovascular:  irregular rhythm, tachycardic  in the 120s. Mild BLE edema.  Respiratory: Respirations even and unlabored. Normal respiratory effort. Lungs CTA bilaterally, no wheezes / rales .   Abdomen: soft, non-distended, non-tender, active bowel sounds. No obvious masses.  Skin: no rash seen  on limited exam Musculoskeletal: grossly normal tone BUE/BLE Psychiatric: grossly normal mood and affect, speech fluent and appropriate Neurologic: grossly non-focal.         LABS ON ADMISSION:    Basic Metabolic Panel:  Recent Labs Lab 06/25/15 0832  NA 129*  K 5.3*  CL 93*  CO2 11*  GLUCOSE 275*  BUN 14  CREATININE 2.26*  CALCIUM 8.4*   Liver Function Tests:  Recent Labs Lab 06/25/15 0832  AST 39  ALT <5*  ALKPHOS 45  BILITOT 0.5  PROT 4.9*  ALBUMIN 2.9*   CBC:  Recent Labs Lab 06/25/15 0832  WBC 23.8*  NEUTROABS 22.0*  HGB 9.3*  HCT 27.1*  MCV 98.9  PLT 141*    CBG:  Recent Labs Lab 06/25/15 0850  GLUCAP 236*    Creatinine clearance cannot be calculated (Unknown ideal weight.)  Radiological Exams on Admission: Ct Head Wo Contrast  06/25/2015  CLINICAL DATA:  Found on floor, unwitnessed fall. Head laceration. Altered mental status, confusion EXAM: CT HEAD WITHOUT CONTRAST CT CERVICAL SPINE WITHOUT CONTRAST TECHNIQUE: Multidetector CT imaging of the head and cervical spine was performed following the standard protocol without intravenous contrast. Multiplanar CT image reconstructions of the cervical spine were also generated. COMPARISON:  None. FINDINGS: CT HEAD FINDINGS There is atrophy and chronic small vessel disease changes. No acute intracranial abnormality. Specifically, no hemorrhage, hydrocephalus, mass lesion, acute infarction, or significant intracranial injury. No acute calvarial abnormality. Visualized paranasal sinuses and mastoids clear. Orbital soft tissues unremarkable. CT CERVICAL SPINE FINDINGS Diffuse degenerative facet disease throughout the cervical spine. Degenerative disc disease in the  lower cervical spine. Prevertebral soft tissues are normal. Alignment is normal. No fracture. No epidural or paraspinal hematoma. IMPRESSION: No acute intracranial abnormality. Atrophy, chronic microvascular disease. Degenerative changes in the cervical spine. No acute bony abnormality. Electronically Signed   By: Rolm Baptise M.D.   On: 06/25/2015 09:58   Ct Cervical Spine Wo Contrast  06/25/2015  CLINICAL DATA:  Found on floor, unwitnessed fall. Head laceration. Altered mental status, confusion EXAM: CT HEAD WITHOUT CONTRAST CT CERVICAL SPINE WITHOUT CONTRAST TECHNIQUE: Multidetector CT imaging of the head and cervical spine was performed following the standard protocol without intravenous contrast. Multiplanar CT image reconstructions of the cervical spine were also generated. COMPARISON:  None. FINDINGS: CT HEAD FINDINGS There is atrophy and chronic small vessel disease changes. No acute intracranial abnormality. Specifically, no hemorrhage, hydrocephalus, mass lesion, acute infarction, or significant intracranial injury. No acute calvarial abnormality. Visualized paranasal sinuses and mastoids clear. Orbital soft tissues unremarkable. CT CERVICAL SPINE FINDINGS Diffuse degenerative facet disease throughout the cervical spine. Degenerative disc disease in the lower cervical spine. Prevertebral soft tissues are normal. Alignment is normal. No fracture. No epidural or paraspinal hematoma. IMPRESSION: No acute intracranial abnormality. Atrophy, chronic microvascular disease. Degenerative changes in the cervical spine. No acute bony abnormality. Electronically Signed   By: Rolm Baptise M.D.   On: 06/25/2015 09:58   Dg Chest Portable 1 View  06/25/2015  CLINICAL DATA:  Found unresponsive, shortness of breath EXAM: PORTABLE CHEST 1 VIEW COMPARISON:  06/22/2007 FINDINGS: Cardiac shadow is stable. The lungs are well aerated bilaterally. No pneumothorax or focal infiltrate is seen. No acute fracture is noted.  IMPRESSION: No acute abnormality seen. Electronically Signed   By: Inez Catalina M.D.   On: 06/25/2015 08:40    ASSESSMENT / PLAN   Severe sepsis. Unclear etiology. Patient found down at home for unknown period of time. Lactic acid  14, white blood cell count 23. CXR and u/a unremarkable. No abdominal pain but RLE is tensely swolllen, mildly erythematous with known previous infection -Admit to stepdown. Critically ill. For now will continue aggressive care. Trying to determine if / who makes medical decisions for patient. mily members. He is cared for by 3 friends. Need to determine if patient has medical POA. He has a POA who  handles legal / financial issues.  -CT scan the abdomen and pelvis. Imaging will need be without contrast given acute renal failure. -Await urine and blood cultures -Continue vancomycin and Zosyn -Monitor lactic acid levels -Patient is a 3 L of fluid in the ED. Continue aggressive IV fluids with a.m. chest x-ray to monitor for volme  overload.      -strict I & 0. Place foley cath -Palliative Care Consult placed. Grim prognosis. Discuss goals of care.  Acute renal failure / metabolic acidosis, likely secondary to lactic acidosis. Low suspicious for DKA, no known history of diabetes.  Initial arterial pH 7.2.  Patient received bicarbonate in ED. -Repeat ABG now -IVF and repeat labs now and then BID. If continues to have large anion gap / acidosis despite IVF and correction of lactic acidosis would consider DKA and starting insulin drip.   Hyperkalemia, K+ 5.3. Mild elevation. Monitor for now. Obtain BID CMETs.   Atrial fibrillation. ChadsVasc 2-3. Heart rate initially in the 150s, now 120s after IV fluid resuscitation -hold home Pradaxa for now (not taking PO and may need emergent surgery) -monitor on tele  Edema of right lower extremity.  There is mild bruising lateral right thigh. Rule out compartment syndrome or as we suspect worsening infection -Stat CT scan of the  RLE with attention to right hip and thigh  History of infected right hip replacement. Patient on long-term (years) antibiotics    CONSULTANTS:    None EDP spoke with CCM on phone. No plans for formal consult at this time.  Code Status: DNR / DNI DVT Prophylaxis:  Heparin Family Communication:  Patient alert but confused.   Disposition Plan:  To be determined. Tried to reach POA Gerald Sandoval at [phone] but no answer and voice mail not accepting messages.   Time spent: 60 minutes Tye Savoy  NP [facility] Pager [phone]

## 2015-06-25 NOTE — ED Provider Notes (Signed)
CSN: [id number]     Arrival date & time 06/25/15  0804 History   First MD Initiated Contact with Patient 06/25/15 0805     Chief Complaint  Patient presents with  . Fall  . Altered Mental Status  . Head Injury  . Head Laceration     (Consider location/radiation/quality/duration/timing/severity/associated sxs/prior Treatment) Patient is a 82 y.o. male presenting with altered mental status. The history is provided by the EMS personnel.  Altered Mental Status Presenting symptoms: behavior changes, confusion, disorientation, lethargy and partial responsiveness   Severity:  Moderate Most recent episode:  Today Episode history:  Continuous Duration:  1 day Timing:  Constant Progression:  Worsening Chronicity:  New Context comment:  Found down after last being spoken to yesterday morning   Past Medical History  Diagnosis Date  . Permanent atrial fibrillation ([place])   . Osteoarthritis   . Gastritis   . Gastric ulcer   . History of colonic polyps   . Dysphagia     unspecified  . Emphysema   . Diastolic dysfunction   . BPH (benign prostatic hyperplasia)   . Rotator cuff injury   . Hernia     Rt. Inguinial  . RBBB (right bundle branch block)   . Kidney stones   . Osteoporosis   . Abscess of left hip     infection left hip  . Hypogonadism in male   . Anemia   . Colon polyp    Past Surgical History  Procedure Laterality Date  . Transurethral resection of prostate    . Inguinal hernia repair      RT  . Total hip arthroplasty      Both hips  . Resection of ribs     Family History  Problem Relation Age of Onset  . Colon cancer Maternal Uncle   . Diabetes Maternal Aunt    Social History  Substance Use Topics  . Smoking status: Never Smoker   . Smokeless tobacco: Never Used  . Alcohol Use: No    Review of Systems  Unable to perform ROS: Mental status change  Psychiatric/Behavioral: Positive for confusion.      Allergies  Primidone  Home Medications    Prior to Admission medications   Medication Sig Start Date End Date Taking? Authorizing Provider  amoxicillin (AMOXIL) 500 MG tablet Take 500 mg by mouth 2 (two) times daily.     Yes Historical Provider, MD  Cyanocobalamin (VITAMIN B-12) 5000 MCG SUBL Place 1 tablet under the tongue daily.     Yes Historical Provider, MD  metoprolol (TOPROL-XL) 50 MG 24 hr tablet Take 1.5 tablets (75 mg total) by mouth daily. 04/15/11  Yes Thompson Grayer, MD  sulfamethoxazole-trimethoprim (BACTRIM DS) 800-160 MG per tablet Take 1 tablet by mouth 2 (two) times daily.     Yes Historical Provider, MD  tamsulosin (FLOMAX) 0.4 MG CAPS capsule Take 0.4 mg by mouth.   Yes Historical Provider, MD  dabigatran (PRADAXA) 150 MG CAPS Take 150 mg by mouth every 12 (twelve) hours.      Historical Provider, MD  ergocalciferol (VITAMIN D2) 50000 UNITS capsule Take 50,000 Units by mouth as directed.      Historical Provider, MD  ferrous sulfate (IRON SUPPLEMENT) 325 (65 FE) MG tablet Take 325 mg by mouth daily with breakfast.      Historical Provider, MD  finasteride (PROSCAR) 5 MG tablet Take 5 mg by mouth daily.      Historical Provider, MD  potassium phosphate, monobasic, (K-PHOS  ORIGINAL) 500 MG tablet Take 1,250 mg by mouth daily.      Historical Provider, MD   BP 146/81 mmHg  Pulse 67  Temp(Src) 98.9 F (37.2 C) (Oral)  Resp 20  SpO2 100% Physical Exam  Constitutional: He appears listless. He appears cachectic. He appears toxic. He appears distressed.  HENT:  Head: Normocephalic. Head is with abrasion and with contusion.    Eyes: Conjunctivae are normal. Pupils are equal, round, and reactive to light.  Neck: Neck supple. No tracheal deviation present.  Cardiovascular: Normal heart sounds.  An irregularly irregular rhythm present. Tachycardia present.   Pulmonary/Chest: Effort normal and breath sounds normal. No accessory muscle usage. Tachypnea noted. No respiratory distress.  Abdominal: Soft. He exhibits no  distension. There is no tenderness.  Musculoskeletal:  No deformities noted  Neurological: He appears listless. He is disoriented. He displays atrophy. No cranial nerve deficit.  Skin: Skin is warm and dry.  Psychiatric: His speech is slurred. He is slowed.  Pleasantly confused    ED Course  Procedures (including critical care time)  CRITICAL CARE Performed by: Leo Grosser Total critical care time: 75 minutes Critical care time was exclusive of separately billable procedures and treating other patients. Critical care was necessary to treat or prevent imminent or life-threatening deterioration. Critical care was time spent personally by me on the following activities: development of treatment plan with patient and/or surrogate as well as nursing, discussions with consultants, evaluation of patient's response to treatment, examination of patient, obtaining history from patient or surrogate, ordering and performing treatments and interventions, ordering and review of laboratory studies, ordering and review of radiographic studies, pulse oximetry and re-evaluation of patient's condition.  Emergency Focused Ultrasound Exam Limited Ultrasound of the Heart and Pericardium  Performed and interpreted by Dr. Laneta Simmers Indication: tachycardia Multiple views of the heart, pericardium, and IVC are obtained with a multi frequency probe.  Findings: increased contractility, no anechoic fluid, complete IVC collapse Interpretation: elevated ejection fraction, no pericardial effusion, depressed CVP Images archived electronically.  CPT Code: [id number]   Labs Review Labs Reviewed  COMPREHENSIVE METABOLIC PANEL - Abnormal; Notable for the following:    Sodium 129 (*)    Potassium 5.3 (*)    Chloride 93 (*)    CO2 11 (*)    Glucose, Bld 275 (*)    Creatinine, Ser 2.26 (*)    Calcium 8.4 (*)    Total Protein 4.9 (*)    Albumin 2.9 (*)    ALT <5 (*)    GFR calc non Af Amer 24 (*)    GFR calc Af Amer  28 (*)    Anion gap 25 (*)    All other components within normal limits  CBC WITH DIFFERENTIAL/PLATELET - Abnormal; Notable for the following:    WBC 23.8 (*)    RBC 2.74 (*)    Hemoglobin 9.3 (*)    HCT 27.1 (*)    Platelets 141 (*)    Neutro Abs 22.0 (*)    All other components within normal limits  URINALYSIS, ROUTINE W REFLEX MICROSCOPIC (NOT AT [facility]) - Abnormal; Notable for the following:    Hgb urine dipstick MODERATE (*)    Ketones, ur 15 (*)    All other components within normal limits  CK - Abnormal; Notable for the following:    Total CK 444 (*)    All other components within normal limits  TSH - Abnormal; Notable for the following:    TSH 4.702 (*)  All other components within normal limits  TROPONIN I - Abnormal; Notable for the following:    Troponin I 0.04 (*)    All other components within normal limits  URINE MICROSCOPIC-ADD ON - Abnormal; Notable for the following:    Casts HYALINE CASTS (*)    All other components within normal limits  I-STAT CG4 LACTIC ACID, ED - Abnormal; Notable for the following:    Lactic Acid, Venous 14.89 (*)    All other components within normal limits  I-STAT ARTERIAL BLOOD GAS, ED - Abnormal; Notable for the following:    pH, Arterial 7.279 (*)    pCO2 arterial 15.6 (*)    pO2, Arterial 130.0 (*)    Bicarbonate 7.3 (*)    Acid-base deficit 17.0 (*)    All other components within normal limits  CBG MONITORING, ED - Abnormal; Notable for the following:    Glucose-Capillary 236 (*)    All other components within normal limits  CULTURE, BLOOD (ROUTINE X 2)  CULTURE, BLOOD (ROUTINE X 2)  URINE CULTURE    Imaging Review Ct Head Wo Contrast  06/25/2015  CLINICAL DATA:  Found on floor, unwitnessed fall. Head laceration. Altered mental status, confusion EXAM: CT HEAD WITHOUT CONTRAST CT CERVICAL SPINE WITHOUT CONTRAST TECHNIQUE: Multidetector CT imaging of the head and cervical spine was performed following the standard protocol  without intravenous contrast. Multiplanar CT image reconstructions of the cervical spine were also generated. COMPARISON:  None. FINDINGS: CT HEAD FINDINGS There is atrophy and chronic small vessel disease changes. No acute intracranial abnormality. Specifically, no hemorrhage, hydrocephalus, mass lesion, acute infarction, or significant intracranial injury. No acute calvarial abnormality. Visualized paranasal sinuses and mastoids clear. Orbital soft tissues unremarkable. CT CERVICAL SPINE FINDINGS Diffuse degenerative facet disease throughout the cervical spine. Degenerative disc disease in the lower cervical spine. Prevertebral soft tissues are normal. Alignment is normal. No fracture. No epidural or paraspinal hematoma. IMPRESSION: No acute intracranial abnormality. Atrophy, chronic microvascular disease. Degenerative changes in the cervical spine. No acute bony abnormality. Electronically Signed   By: Rolm Baptise M.D.   On: 06/25/2015 09:58   Ct Cervical Spine Wo Contrast  06/25/2015  CLINICAL DATA:  Found on floor, unwitnessed fall. Head laceration. Altered mental status, confusion EXAM: CT HEAD WITHOUT CONTRAST CT CERVICAL SPINE WITHOUT CONTRAST TECHNIQUE: Multidetector CT imaging of the head and cervical spine was performed following the standard protocol without intravenous contrast. Multiplanar CT image reconstructions of the cervical spine were also generated. COMPARISON:  None. FINDINGS: CT HEAD FINDINGS There is atrophy and chronic small vessel disease changes. No acute intracranial abnormality. Specifically, no hemorrhage, hydrocephalus, mass lesion, acute infarction, or significant intracranial injury. No acute calvarial abnormality. Visualized paranasal sinuses and mastoids clear. Orbital soft tissues unremarkable. CT CERVICAL SPINE FINDINGS Diffuse degenerative facet disease throughout the cervical spine. Degenerative disc disease in the lower cervical spine. Prevertebral soft tissues are normal.  Alignment is normal. No fracture. No epidural or paraspinal hematoma. IMPRESSION: No acute intracranial abnormality. Atrophy, chronic microvascular disease. Degenerative changes in the cervical spine. No acute bony abnormality. Electronically Signed   By: Rolm Baptise M.D.   On: 06/25/2015 09:58   Dg Chest Portable 1 View  06/25/2015  CLINICAL DATA:  Found unresponsive, shortness of breath EXAM: PORTABLE CHEST 1 VIEW COMPARISON:  06/22/2007 FINDINGS: Cardiac shadow is stable. The lungs are well aerated bilaterally. No pneumothorax or focal infiltrate is seen. No acute fracture is noted. IMPRESSION: No acute abnormality seen. Electronically Signed   By:  Inez Catalina M.D.   On: 06/25/2015 08:40   I have personally reviewed and evaluated these images and lab results as part of my medical decision-making.   EKG Interpretation   Date/Time:  Sunday June 25 2015 08:06:22 EST Ventricular Rate:  149 PR Interval:  163 QRS Duration: 137 QT Interval:  302 QTC Calculation: 475 R Axis:   -93 Text Interpretation:  Atrial tachycardia RBBB and LAFB Artifact in lead(s)  I II III aVR aVL aVF V1 V3 V4 V5 V6 Interpretation limited secondary to  artifact Confirmed by Alexys Lobello MD, Ova Meegan [id number]) on 06/25/2015 8:23:53 AM      MDM   Final diagnoses:  Severe sepsis (HCC)  Lactic acidosis  Altered mental status, unspecified altered mental status type  AKI (acute kidney injury) ([place])  Hyperkalemia    82 y.o. male presents with being found down at home for unknown period of time. He was lethargic and confused when found by a family friend. On arrival patient is tachycardic from 150-160, clinically severely dehydrated, confused but arousable. The patient was aggressively resuscitated with fluid and had collapsible IVC noted on bedside ultrasound suggestive of sepsis or volume depletion. After 3 L of normal saline the patient's heart rate began to come down and lab work is returning notable for severe anion gap  acidosis consistent with lactic acidosis given dramatic elevation to 14. Considered DKA but less likely given only small ketones on UA and appropriate gap for measures lactate level. No evidence of UTI, Pneumonia, transaminitis to suggest alternative source. Troponin elevation likely 2/2 to perfusion defect.   High white blood cell count and tachycardia combined with lactate elevation and possible chronic right hip replacement infection per family friend who is able to help give the history is suggestive of severe sepsis. Continued resuscitation with bicarb containing fluids as Pt with evidence of AKI and hyperkalemia as well as metabolic acidosis. Patient was never hypotensive during his emergency department course but reportedly had a low blood pressure with EMS PTA. Pt requiring frequent reassessments of volume status and hemodynamics necessitating prolonged ED resuscitation to avoid respiratory compromise. Discussed with critical care who agreed with management and recommended stepdown admission as Pt is DNR/DNI per previous documentation brought to ED confirmed by family friend at bedside. Hospitalist was consulted for admission and will see the patient in the emergency department.     Leo Grosser, MD 06/26/15 1153

## 2015-06-25 NOTE — Progress Notes (Signed)
CSW received consult from RN to speak with patient. CSW went to assess patient, however patient is disoriented and confused. No family is at bedside. CSW will attempt to get in contact with family regarding patient and his current situation.   Gerald Sandoval, [facility] Ph: [phone]

## 2015-06-25 NOTE — ED Notes (Signed)
Person responsible for the affairs of the patient is at bedside. Person has yellow DNR form. Dr Laneta Simmers at bedside updating visitor on plan of care. Per visitor, girlfriend of pt called pt last night at 9pm, when she did not get a response she called the facility to ask them to check on him. Facility told girlfriend that they were busy at the moment and would check on him in the morning.

## 2015-06-25 NOTE — ED Notes (Signed)
Received pt via EMS from [facility] with c/o found on floor in apartment. Friend tried to call pt last night at 9pm no response, friend called again this morning no response so called the facility. Security came to pt apartment to check on pt. Pt has head lac and confused. [facility] 332 for EMS. Pt c/o shortness of breath.

## 2015-06-25 NOTE — Consult Note (Signed)
ORTHOPAEDIC CONSULTATION  REQUESTING PHYSICIAN: Janece Canterbury, MD  Chief Complaint: Right periprosthetic hip fx, sepsis, r/o compartment syndrome right thigh  HPI: Gerald Sandoval is a 82 y.o. male who presents with right periprosthetic hip fracture s/p unwitnessed mechanical fall at his facility in which he was found down for unknown period of time.  The patient is hard of hearing and poor historian.  His hip replacements were done at [facility] in the 90s.  The patient endorses moderate pain in the right hip, that does not radiate, grinding in quality, worse with any movement, better with immobilization.  not able to bear weight on RLE.  Denies any symptoms in LLE.  Denies LOC/fever/chills/nausea/vomiting.  Walks with assistive devices (walker, cane, wheelchair).  Does not live independently. Gerald Sandoval is HCPOA who is coming from [employer].  Patient states he has chronic left THA infection that is managed by chronic suppressive po abx.    Past Medical History  Diagnosis Date  . Permanent atrial fibrillation ([place])   . Osteoarthritis   . Gastritis   . Gastric ulcer   . History of colonic polyps   . Dysphagia     unspecified  . Emphysema   . Diastolic dysfunction   . BPH (benign prostatic hyperplasia)   . Rotator cuff injury   . Hernia     Rt. Inguinial  . RBBB (right bundle branch block)   . Kidney stones   . Osteoporosis   . Abscess of left hip     infection left hip  . Hypogonadism in male   . Anemia   . Colon polyp    Past Surgical History  Procedure Laterality Date  . Transurethral resection of prostate    . Inguinal hernia repair      RT  . Total hip arthroplasty      Both hips  . Resection of ribs     Social History   Social History  . Marital Status: Single    Spouse Name: N/A  . Number of Children: 0  . Years of Education: N/A   Occupational History  . Retired: [employer]    Social History Main Topics  . Smoking status: Never Smoker   .  Smokeless tobacco: Never Used  . Alcohol Use: No  . Drug Use: No  . Sexual Activity: Not Asked   Other Topics Concern  . None   Social History Narrative   Patient lives in [place] alone.   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   His closest living relative is his sister who is 37 and lives in nursing home.   No caffeine   Retired [employer]   Family History  Problem Relation Age of Onset  . Colon cancer Maternal Uncle   . Diabetes Maternal Aunt    Allergies  Allergen Reactions  . Primidone    Prior to Admission medications   Medication Sig Start Date End Date Taking? Authorizing Provider  amoxicillin (AMOXIL) 500 MG tablet Take 500 mg by mouth 2 (two) times daily.     Yes Historical Provider, MD  Cyanocobalamin (VITAMIN B-12) 5000 MCG SUBL Place 1 tablet under the tongue daily.     Yes Historical Provider, MD  metoprolol (TOPROL-XL) 50 MG 24 hr tablet Take 1.5 tablets (75 mg total) by mouth daily. 04/15/11  Yes Thompson Grayer, MD  sulfamethoxazole-trimethoprim (BACTRIM DS) 800-160 MG per tablet Take 1 tablet by mouth 2 (two) times  daily.     Yes Historical Provider, MD  tamsulosin (FLOMAX) 0.4 MG CAPS capsule Take 0.4 mg by mouth.   Yes Historical Provider, MD  dabigatran (PRADAXA) 150 MG CAPS Take 150 mg by mouth every 12 (twelve) hours.      Historical Provider, MD  ergocalciferol (VITAMIN D2) 50000 UNITS capsule Take 50,000 Units by mouth as directed.      Historical Provider, MD  ferrous sulfate (IRON SUPPLEMENT) 325 (65 FE) MG tablet Take 325 mg by mouth daily with breakfast.      Historical Provider, MD  finasteride (PROSCAR) 5 MG tablet Take 5 mg by mouth daily.      Historical Provider, MD  potassium phosphate, monobasic, (K-PHOS ORIGINAL) 500 MG tablet Take 1,250 mg by mouth daily.      Historical Provider, MD   Ct Head Wo Contrast  06/25/2015  CLINICAL DATA:  Found on floor, unwitnessed fall. Head laceration. Altered  mental status, confusion EXAM: CT HEAD WITHOUT CONTRAST CT CERVICAL SPINE WITHOUT CONTRAST TECHNIQUE: Multidetector CT imaging of the head and cervical spine was performed following the standard protocol without intravenous contrast. Multiplanar CT image reconstructions of the cervical spine were also generated. COMPARISON:  None. FINDINGS: CT HEAD FINDINGS There is atrophy and chronic small vessel disease changes. No acute intracranial abnormality. Specifically, no hemorrhage, hydrocephalus, mass lesion, acute infarction, or significant intracranial injury. No acute calvarial abnormality. Visualized paranasal sinuses and mastoids clear. Orbital soft tissues unremarkable. CT CERVICAL SPINE FINDINGS Diffuse degenerative facet disease throughout the cervical spine. Degenerative disc disease in the lower cervical spine. Prevertebral soft tissues are normal. Alignment is normal. No fracture. No epidural or paraspinal hematoma. IMPRESSION: No acute intracranial abnormality. Atrophy, chronic microvascular disease. Degenerative changes in the cervical spine. No acute bony abnormality. Electronically Signed   By: Rolm Baptise M.D.   On: 06/25/2015 09:58   Ct Cervical Spine Wo Contrast  06/25/2015  CLINICAL DATA:  Found on floor, unwitnessed fall. Head laceration. Altered mental status, confusion EXAM: CT HEAD WITHOUT CONTRAST CT CERVICAL SPINE WITHOUT CONTRAST TECHNIQUE: Multidetector CT imaging of the head and cervical spine was performed following the standard protocol without intravenous contrast. Multiplanar CT image reconstructions of the cervical spine were also generated. COMPARISON:  None. FINDINGS: CT HEAD FINDINGS There is atrophy and chronic small vessel disease changes. No acute intracranial abnormality. Specifically, no hemorrhage, hydrocephalus, mass lesion, acute infarction, or significant intracranial injury. No acute calvarial abnormality. Visualized paranasal sinuses and mastoids clear. Orbital soft  tissues unremarkable. CT CERVICAL SPINE FINDINGS Diffuse degenerative facet disease throughout the cervical spine. Degenerative disc disease in the lower cervical spine. Prevertebral soft tissues are normal. Alignment is normal. No fracture. No epidural or paraspinal hematoma. IMPRESSION: No acute intracranial abnormality. Atrophy, chronic microvascular disease. Degenerative changes in the cervical spine. No acute bony abnormality. Electronically Signed   By: Rolm Baptise M.D.   On: 06/25/2015 09:58   Ct Femur Right Wo Contrast  06/25/2015  CLINICAL DATA:  Leg pain. Pacemaker precludes MRI. Clinical concern for compartment syndrome. EXAM: CT OF THE RIGHT FEMUR WITHOUT CONTRAST; CT OF THE RIGHT TIBIA FIBULA WITHOUT CONTRAST TECHNIQUE: Multidetector CT imaging was performed according to the standard protocol. Multiplanar CT image reconstructions were also generated. COMPARISON:  None. FINDINGS: CT right femur: Right total hip prosthesis observed with a periprosthetic fracture tracking from the greater trochanter longitudinal along the stem. There is an oblique component which tracks medially and is displaced 1.4 cm posteromedial from the rest of the shaft.  The tip of this oblique proximal medial fragment is about at the level of the tip of the implant. However, the fracture appears to extend slightly further in a longitudinal fashion posteromedially as shown on images 87 through 112 of series 1. Below this level the fracture plane is no longer well shown by CT. Bony demineralization is present. Common is requested for compartment syndrome. Assessment of the upper thigh is especially problematic due to the extensive streak artifact from the implant which can obscure the subtle intramuscular effacement of adipose tissue and other signs of compartment syndrome. I see no specific evidence of compartment syndrome but compartmental pressures would be a much more reliable check. Further distally and below the level of the  implant, the visualized muscle compartments appear unusually hypertrophic/ well-developed for age, but still with marbling of adipose tissue in the muscles which would argue against a compartment syndrome. There is a small knee effusion along with some prepatellar edema. Diffuse atherosclerotic calcification is observed. CT right lower leg: No visualized tubular fibular fracture is observed; the distal most tip of the fibula is excluded. Diffuse atherosclerosis. There is subcutaneous edema tracking in the calf diffusely. Fatty marbling in the muscle tissues is present, slightly less notable in the tibialis anterior, but the other anterior compartmental muscles appear unremarkable and accordingly I am skeptical of compartment syndrome in the calf. IMPRESSION: 1. Periprosthetic fracture along the right hip implant, extending from the greater trochanter along the stem of the implant. This has a displaced component, with the medial fracture fragment about 1.4 cm posteromedial to the rest of the shaft at its distal tip. This distal tip is at about the level of the stem of the implant. There is also a longitudinal fracture component extending distally from the level of the tip of the implant within the shaft, which becomes indistinct several sent centimeters distally. 2. There is subcutaneous edema in the thigh and calf, but reasonably normal marbling of adipose tissue in the muscles arguing against compartment syndrome. Clearly muscle compartment pressures are more sensitive and specific measure, and particularly in the upper thigh in the vicinity of the implant the streak artifact from the metal further reduces diagnostic sensitivity. If there is continued clinical concern for compartment syndrome than compartment manometry would be recommended. 3. Small knee effusion. 4. No gas tracking in the soft tissues, or specific findings of high concern for abscess. Electronically Signed   By: Van Clines M.D.   On:  06/25/2015 14:29   Ct Tibia Fibula Right Wo Contrast  06/25/2015  CLINICAL DATA:  Leg pain. Pacemaker precludes MRI. Clinical concern for compartment syndrome. EXAM: CT OF THE RIGHT FEMUR WITHOUT CONTRAST; CT OF THE RIGHT TIBIA FIBULA WITHOUT CONTRAST TECHNIQUE: Multidetector CT imaging was performed according to the standard protocol. Multiplanar CT image reconstructions were also generated. COMPARISON:  None. FINDINGS: CT right femur: Right total hip prosthesis observed with a periprosthetic fracture tracking from the greater trochanter longitudinal along the stem. There is an oblique component which tracks medially and is displaced 1.4 cm posteromedial from the rest of the shaft. The tip of this oblique proximal medial fragment is about at the level of the tip of the implant. However, the fracture appears to extend slightly further in a longitudinal fashion posteromedially as shown on images 87 through 112 of series 1. Below this level the fracture plane is no longer well shown by CT. Bony demineralization is present. Common is requested for compartment syndrome. Assessment of the upper thigh is  especially problematic due to the extensive streak artifact from the implant which can obscure the subtle intramuscular effacement of adipose tissue and other signs of compartment syndrome. I see no specific evidence of compartment syndrome but compartmental pressures would be a much more reliable check. Further distally and below the level of the implant, the visualized muscle compartments appear unusually hypertrophic/ well-developed for age, but still with marbling of adipose tissue in the muscles which would argue against a compartment syndrome. There is a small knee effusion along with some prepatellar edema. Diffuse atherosclerotic calcification is observed. CT right lower leg: No visualized tubular fibular fracture is observed; the distal most tip of the fibula is excluded. Diffuse atherosclerosis. There is  subcutaneous edema tracking in the calf diffusely. Fatty marbling in the muscle tissues is present, slightly less notable in the tibialis anterior, but the other anterior compartmental muscles appear unremarkable and accordingly I am skeptical of compartment syndrome in the calf. IMPRESSION: 1. Periprosthetic fracture along the right hip implant, extending from the greater trochanter along the stem of the implant. This has a displaced component, with the medial fracture fragment about 1.4 cm posteromedial to the rest of the shaft at its distal tip. This distal tip is at about the level of the stem of the implant. There is also a longitudinal fracture component extending distally from the level of the tip of the implant within the shaft, which becomes indistinct several sent centimeters distally. 2. There is subcutaneous edema in the thigh and calf, but reasonably normal marbling of adipose tissue in the muscles arguing against compartment syndrome. Clearly muscle compartment pressures are more sensitive and specific measure, and particularly in the upper thigh in the vicinity of the implant the streak artifact from the metal further reduces diagnostic sensitivity. If there is continued clinical concern for compartment syndrome than compartment manometry would be recommended. 3. Small knee effusion. 4. No gas tracking in the soft tissues, or specific findings of high concern for abscess. Electronically Signed   By: Van Clines M.D.   On: 06/25/2015 14:29   Dg Chest Portable 1 View  06/25/2015  CLINICAL DATA:  Found unresponsive, shortness of breath EXAM: PORTABLE CHEST 1 VIEW COMPARISON:  06/22/2007 FINDINGS: Cardiac shadow is stable. The lungs are well aerated bilaterally. No pneumothorax or focal infiltrate is seen. No acute fracture is noted. IMPRESSION: No acute abnormality seen. Electronically Signed   By: Inez Catalina M.D.   On: 06/25/2015 08:40    All pertinent xrays, MRI, CT independently  reviewed and interpreted  Positive ROS: All other systems have been reviewed and were otherwise negative with the exception of those mentioned in the HPI and as above.  Physical Exam: General: Alert, no acute distress Cardiovascular: No pedal edema Respiratory: No cyanosis, no use of accessory musculature GI: No organomegaly, abdomen is soft and non-tender Skin: No lesions in the area of chief complaint Neurologic: Sensation intact distally Psychiatric: Patient is competent for consent with normal mood and affect Lymphatic: No axillary or cervical lymphadenopathy  MUSCULOSKELETAL:  - mild pain with movement of the hip and extremity - thigh is swollen but no signs of compartment syndrome - skin intact - NVI distally - compartments soft - no pain with passive stretch of toes and ankle  Assessment: Right periprosthetic hip fracture Sepsis  Plan: - suspicion for compartment syndrome is low, thigh swelling is c/w fracture - no evidence to indicate that sepsis is coming from hip replacements - xray of hip ordered to better  characterize fracture - NWB RLE for now - will follow along  Thank you for the consult and the opportunity to see Mr. Andy  N. Eduard Roux, MD [place] [phone] 6:03 PM

## 2015-06-25 NOTE — ED Notes (Signed)
Admitting MD at bedside.

## 2015-06-25 NOTE — ED Notes (Signed)
CBG 236. 

## 2015-06-26 ENCOUNTER — Inpatient Hospital Stay (HOSPITAL_COMMUNITY): Payer: Medicare Other

## 2015-06-26 ENCOUNTER — Other Ambulatory Visit (HOSPITAL_COMMUNITY): Payer: Medicare Other

## 2015-06-26 ENCOUNTER — Encounter (HOSPITAL_COMMUNITY): Payer: Self-pay | Admitting: Student

## 2015-06-26 DIAGNOSIS — Z01810 Encounter for preprocedural cardiovascular examination: Secondary | ICD-10-CM

## 2015-06-26 DIAGNOSIS — T148XXA Other injury of unspecified body region, initial encounter: Secondary | ICD-10-CM | POA: Diagnosis present

## 2015-06-26 DIAGNOSIS — R609 Edema, unspecified: Secondary | ICD-10-CM

## 2015-06-26 DIAGNOSIS — I481 Persistent atrial fibrillation: Secondary | ICD-10-CM

## 2015-06-26 DIAGNOSIS — R6520 Severe sepsis without septic shock: Secondary | ICD-10-CM

## 2015-06-26 DIAGNOSIS — N170 Acute kidney failure with tubular necrosis: Secondary | ICD-10-CM

## 2015-06-26 DIAGNOSIS — A419 Sepsis, unspecified organism: Principal | ICD-10-CM

## 2015-06-26 DIAGNOSIS — T148 Other injury of unspecified body region: Secondary | ICD-10-CM

## 2015-06-26 LAB — COMPREHENSIVE METABOLIC PANEL
ALBUMIN: 2.5 g/dL — AB (ref 3.5–5.0)
ALBUMIN: 2.5 g/dL — AB (ref 3.5–5.0)
ALK PHOS: 32 U/L — AB (ref 38–126)
ALK PHOS: 29 U/L — AB (ref 38–126)
ALT: 20 U/L (ref 17–63)
ALT: 21 U/L (ref 17–63)
ALT: 22 U/L (ref 17–63)
AST: 47 U/L — AB (ref 15–41)
AST: 47 U/L — AB (ref 15–41)
AST: 52 U/L — AB (ref 15–41)
Albumin: 2.5 g/dL — ABNORMAL LOW (ref 3.5–5.0)
Alkaline Phosphatase: 31 U/L — ABNORMAL LOW (ref 38–126)
Anion gap: 8 (ref 5–15)
Anion gap: 5 (ref 5–15)
Anion gap: 9 (ref 5–15)
BILIRUBIN TOTAL: 0.4 mg/dL (ref 0.3–1.2)
BILIRUBIN TOTAL: 0.5 mg/dL (ref 0.3–1.2)
BILIRUBIN TOTAL: 1.7 mg/dL — AB (ref 0.3–1.2)
BUN: 16 mg/dL (ref 6–20)
BUN: 15 mg/dL (ref 6–20)
BUN: 15 mg/dL (ref 6–20)
CALCIUM: 7.5 mg/dL — AB (ref 8.9–10.3)
CHLORIDE: 103 mmol/L (ref 101–111)
CO2: 23 mmol/L (ref 22–32)
CO2: 23 mmol/L (ref 22–32)
CO2: 24 mmol/L (ref 22–32)
CREATININE: 1.36 mg/dL — AB (ref 0.61–1.24)
CREATININE: 1.19 mg/dL (ref 0.61–1.24)
Calcium: 7.6 mg/dL — ABNORMAL LOW (ref 8.9–10.3)
Calcium: 7.7 mg/dL — ABNORMAL LOW (ref 8.9–10.3)
Chloride: 101 mmol/L (ref 101–111)
Chloride: 102 mmol/L (ref 101–111)
Creatinine, Ser: 1.42 mg/dL — ABNORMAL HIGH (ref 0.61–1.24)
GFR calc Af Amer: 49 mL/min — ABNORMAL LOW (ref >60)
GFR calc Af Amer: 52 mL/min — ABNORMAL LOW (ref >60)
GFR calc Af Amer: >60 mL/min (ref >60)
GFR calc non Af Amer: 42 mL/min — ABNORMAL LOW (ref >60)
GFR, EST NON AFRICAN AMERICAN: 44 mL/min — AB (ref >60)
GFR, EST NON AFRICAN AMERICAN: 52 mL/min — AB (ref >60)
GLUCOSE: 121 mg/dL — AB (ref 65–99)
GLUCOSE: 123 mg/dL — AB (ref 65–99)
Glucose, Bld: 116 mg/dL — ABNORMAL HIGH (ref 65–99)
POTASSIUM: 4.2 mmol/L (ref 3.5–5.1)
Potassium: 3.9 mmol/L (ref 3.5–5.1)
Potassium: 3.8 mmol/L (ref 3.5–5.1)
SODIUM: 136 mmol/L (ref 135–145)
Sodium: 132 mmol/L — ABNORMAL LOW (ref 135–145)
Sodium: 130 mmol/L — ABNORMAL LOW (ref 135–145)
TOTAL PROTEIN: 4.2 g/dL — AB (ref 6.5–8.1)
TOTAL PROTEIN: 4.3 g/dL — AB (ref 6.5–8.1)
Total Protein: 4.1 g/dL — ABNORMAL LOW (ref 6.5–8.1)

## 2015-06-26 LAB — CBC
HCT: 19 % — ABNORMAL LOW (ref 39.0–52.0)
HEMATOCRIT: 17.8 % — AB (ref 39.0–52.0)
HEMATOCRIT: 23.4 % — AB (ref 39.0–52.0)
HEMOGLOBIN: 6.8 g/dL — AB (ref 13.0–17.0)
HEMOGLOBIN: 6.5 g/dL — AB (ref 13.0–17.0)
HEMOGLOBIN: 8.3 g/dL — AB (ref 13.0–17.0)
MCH: 34 pg (ref 26.0–34.0)
MCH: 34.6 pg — ABNORMAL HIGH (ref 26.0–34.0)
MCH: 32.5 pg (ref 26.0–34.0)
MCHC: 35.8 g/dL (ref 30.0–36.0)
MCHC: 36.5 g/dL — ABNORMAL HIGH (ref 30.0–36.0)
MCHC: 35.5 g/dL (ref 30.0–36.0)
MCV: 95 fL (ref 78.0–100.0)
MCV: 94.7 fL (ref 78.0–100.0)
MCV: 91.8 fL (ref 78.0–100.0)
PLATELETS: 104 10*3/uL — AB (ref 150–400)
Platelets: 95 10*3/uL — ABNORMAL LOW (ref 150–400)
Platelets: 95 10*3/uL — ABNORMAL LOW (ref 150–400)
RBC: 2 MIL/uL — AB (ref 4.22–5.81)
RBC: 1.88 MIL/uL — ABNORMAL LOW (ref 4.22–5.81)
RBC: 2.55 MIL/uL — AB (ref 4.22–5.81)
RDW: 13.7 % (ref 11.5–15.5)
RDW: 13.6 % (ref 11.5–15.5)
RDW: 15.9 % — ABNORMAL HIGH (ref 11.5–15.5)
WBC: 25.7 10*3/uL — ABNORMAL HIGH (ref 4.0–10.5)
WBC: 24.3 10*3/uL — AB (ref 4.0–10.5)
WBC: 20.6 10*3/uL — AB (ref 4.0–10.5)

## 2015-06-26 LAB — TROPONIN I
TROPONIN I: 0.07 ng/mL — AB (ref <0.031)
TROPONIN I: 0.09 ng/mL — AB (ref <0.031)

## 2015-06-26 LAB — URINE CULTURE: CULTURE: NO GROWTH

## 2015-06-26 LAB — PREPARE RBC (CROSSMATCH)

## 2015-06-26 LAB — ABO/RH: ABO/RH(D): A POS

## 2015-06-26 LAB — TSH: TSH: 2.43 u[IU]/mL (ref 0.350–4.500)

## 2015-06-26 LAB — OCCULT BLOOD X 1 CARD TO LAB, STOOL: Fecal Occult Bld: NEGATIVE

## 2015-06-26 LAB — CK: Total CK: 1042 U/L — ABNORMAL HIGH (ref 49–397)

## 2015-06-26 MED ORDER — SODIUM CHLORIDE 0.9 % IV SOLN: Freq: Once | INTRAVENOUS | Status: DC

## 2015-06-26 MED ORDER — SODIUM CHLORIDE 0.9 % IV SOLN: Freq: Once | INTRAVENOUS | Status: AC

## 2015-06-26 MED ORDER — SODIUM CHLORIDE 0.9 % IV BOLUS (SEPSIS)
250.0000 mL | Freq: Once | INTRAVENOUS | Status: AC
  Administered 2015-06-26: 250 mL via INTRAVENOUS

## 2015-06-26 MED ORDER — SODIUM CHLORIDE 0.9 % IV BOLUS (SEPSIS): 250.0000 mL | INTRAVENOUS | Status: DC | PRN

## 2015-06-26 NOTE — Progress Notes (Signed)
VASCULAR LAB PRELIMINARY  PRELIMINARY  PRELIMINARY  PRELIMINARY  Bilateral lower extremity venous Doppler has been completed.     Right:Acute thrombus- Femoral Vein prox, mid and distally - DVT noted.   No evidence of superficial thrombosis.  No Baker's cyst.  Left leg no evidence of DVT. No evidence of superificial thrombus.No baker's cyst   Give the result to patient RN.  Neddie Steedman, RVT, RDMS 06/26/2015, 1:39 PM

## 2015-06-26 NOTE — Progress Notes (Signed)
Physician notified: Allyson Sabal At: 4  Regarding: FYI DVT RLE--see vascular duplex results. SQ heparin ordered/given. No order for CT abd/pelvis. Checking labs now, post 2 units PRBC. BP 110sys.  Awaiting return response.   Returned Response at: 1854  Order(s): plan IVC filter 1/31.

## 2015-06-26 NOTE — Consult Note (Signed)
CARDIOLOGY CONSULT NOTE   Patient ID: Gerald Sandoval MRN: [id number], DOB/AGE: 80-Nov-1925   Admit date: 06/25/2015 Date of Consult: 06/26/2015 Reason for Consult: Preoperative Clearance Physician Requesting Consult: Dr. Allyson Sabal   Primary Physician: Jerlyn Ly, MD Primary Cardiologist: Dr. Rayann Heman in 2012  HPI: Gerald Sandoval is a 82 y.o. male with past medical history of chronic atrial fibrillation (on Pradaxa), RBBB, OA (s/p bilateral hip replacement with chronic infection of the Left), BPH, and chronic diastolic CHF brought to [facility] on 06/25/2015 after being found down at home earlier that day.  He was found down by staff at [facility] during the morning hours of 06/25/2015. The patient reports in the evening hours of 1/28, he was walking from his recliner to the bed but did not have a light on. He tripped and fell backwards into his closet. He remembers his phone ringing several times that evening but was on the floor and unable to get to it (Says this was his girlfriend from [place], [place]. calling). The following morning, his girlfriend was still unable to get him on the phone and contacted Alfredo Bach and that is when they found him on the floor.   The patient reports he remembers falling and hearing the phone ring but is unaware of the events which took place yesterday morning and does not remember the staff finding him or his transport to [facility].   While in the ED, his creatinine was elevated to 2.26. K+ 5.3. WBC elevated to 21.1, Hgb at 8.1. Arterial pH was 7.279 with O2 of 130 and CO2 of 15.6. Lactic Acid was 14.89. Troponin I at 0.04. CT of the head showed no acute intracranial abnormalities. CT of the hip showed a periprosthetic fracture along with right hip implant. His initial EKG showed atrial fibrillation with HR in the 140's.   Overnight, his WBC has continued to increase to 25.7. Hgb has dropped to 6.5. Platelets have dropped from 123 on admission to 95. Telemetry  shows constant atrial fibrillation with rates in the 90's - 120's. SBP has been mostly in the 80's, peaking into the 110's at times.  On examination today, he denies being in any current pain. Denies any chest pain, palpitations, dyspnea, orthopnea, or PND. Does report occasional lower extremity edema. Reports no prior cardiac history of MI's or CAD. Reports being asymptomatic with his atrial fibrillation. Was on Pradaxa for anticoagulation and Toprol-XL 75mg  daily for rate control prior to admission.   Problem List Past Medical History  Diagnosis Date  . Permanent atrial fibrillation ([place])   . Osteoarthritis   . Gastritis   . Gastric ulcer   . History of colonic polyps   . Dysphagia     unspecified  . Emphysema   . Diastolic dysfunction   . BPH (benign prostatic hyperplasia)   . Rotator cuff injury   . Hernia     Rt. Inguinial  . RBBB (right bundle branch block)   . Kidney stones   . Osteoporosis   . Abscess of left hip     infection left hip  . Hypogonadism in male   . Anemia   . Colon polyp     Past Surgical History  Procedure Laterality Date  . Transurethral resection of prostate    . Inguinal hernia repair      RT  . Total hip arthroplasty      Both hips; Left in 2004, Right in 2007  . Resection of ribs  Allergies Allergies  Allergen Reactions  . Primidone       Inpatient Medications . sodium chloride   Intravenous Once  . heparin  5,000 Units Subcutaneous 3 times per day  . piperacillin-tazobactam (ZOSYN)  IV  3.375 g Intravenous 3 times per day  . sodium chloride flush  3 mL Intravenous Q12H  . vancomycin  750 mg Intravenous Q24H    Family History Family History  Problem Relation Age of Onset  . Colon cancer Maternal Uncle   . Diabetes Maternal Aunt      Social History Social History   Social History  . Marital Status: Single    Spouse Name: N/A  . Number of Children: 0  . Years of Education: N/A   Occupational History  . Retired:  [employer]    Social History Main Topics  . Smoking status: Never Smoker   . Smokeless tobacco: Never Used  . Alcohol Use: No  . Drug Use: No  . Sexual Activity: Not on file   Other Topics Concern  . Not on file   Social History Narrative   Patient lives in [place] alone.   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   His closest living relative is his sister who is 31 and lives in nursing home.   No caffeine   Retired [employer]:  No chills, fever, night sweats or weight changes. Positive for falls. Cardiovascular:  No chest pain, dyspnea on exertion, orthopnea, palpitations, paroxysmal nocturnal dyspnea. Positive for edema. Dermatological: No rash, lesions/masses Respiratory: No cough, dyspnea Urologic: No hematuria, dysuria Abdominal:   No nausea, vomiting, diarrhea, bright red blood per rectum, melena, or hematemesis Neurologic:  No visual changes, wkns, changes in mental status. All other systems reviewed and are otherwise negative except as noted above.  Physical Exam  Blood pressure 104/55, pulse 90, temperature 98.1 F (36.7 C), temperature source Oral, resp. rate 16, height 5\' 8"  (1.727 m), weight 153 lb 3.5 oz (69.5 kg), SpO2 100 %.  General: Pleasant, thin-appearing elderly male appearing in NAD Psych: Normal affect. Neuro: Alert and oriented X 3. Moves all extremities spontaneously. HEENT: Normal  Neck: Supple without bruits or JVD. Lungs:  Resp regular and unlabored, Decreased breath sounds at the bases bilaterally. No wheezing or rales appreciated. Heart: Irregularly irregular, no s3, s4, 2/6 SEM at LUSB. Abdomen: Soft, non-tender, non-distended, BS + x 4.  Extremities: No clubbing, cyanosis or edema. Significant xerosis. DP/PT/Radials 2+ and equal bilaterally.  Labs   Recent Labs  06/25/15 0832  CKTOTAL 444*  TROPONINI 0.04*   Lab Results  Component Value Date    WBC 24.3* 06/26/2015   HGB 6.5* 06/26/2015   HCT 17.8* 06/26/2015   MCV 94.7 06/26/2015   PLT 95* 06/26/2015     Recent Labs Lab 06/26/15 0648  NA 130*  K 3.9  CL 102  CO2 23  BUN 15  CREATININE 1.36*  CALCIUM 7.5*  PROT 4.3*  BILITOT 0.5  ALKPHOS 29*  ALT 21  AST 47*  GLUCOSE 123*    Radiology/Studies  Ct Head Wo Contrast: 06/25/2015  CLINICAL DATA:  Found on floor, unwitnessed fall. Head laceration. Altered mental status, confusion EXAM: CT HEAD WITHOUT CONTRAST CT CERVICAL SPINE WITHOUT CONTRAST TECHNIQUE: Multidetector CT imaging of the head and cervical spine was performed following the standard protocol without intravenous contrast. Multiplanar CT image reconstructions of the cervical spine were  also generated. COMPARISON:  None. FINDINGS: CT HEAD FINDINGS There is atrophy and chronic small vessel disease changes. No acute intracranial abnormality. Specifically, no hemorrhage, hydrocephalus, mass lesion, acute infarction, or significant intracranial injury. No acute calvarial abnormality. Visualized paranasal sinuses and mastoids clear. Orbital soft tissues unremarkable. CT CERVICAL SPINE FINDINGS Diffuse degenerative facet disease throughout the cervical spine. Degenerative disc disease in the lower cervical spine. Prevertebral soft tissues are normal. Alignment is normal. No fracture. No epidural or paraspinal hematoma. IMPRESSION: No acute intracranial abnormality. Atrophy, chronic microvascular disease. Degenerative changes in the cervical spine. No acute bony abnormality. Electronically Signed   By: Rolm Baptise M.D.   On: 06/25/2015 09:58   Ct Cervical Spine Wo Contrast: 06/25/2015  CLINICAL DATA:  Found on floor, unwitnessed fall. Head laceration. Altered mental status, confusion EXAM: CT HEAD WITHOUT CONTRAST CT CERVICAL SPINE WITHOUT CONTRAST TECHNIQUE: Multidetector CT imaging of the head and cervical spine was performed following the standard protocol without intravenous  contrast. Multiplanar CT image reconstructions of the cervical spine were also generated. COMPARISON:  None. FINDINGS: CT HEAD FINDINGS There is atrophy and chronic small vessel disease changes. No acute intracranial abnormality. Specifically, no hemorrhage, hydrocephalus, mass lesion, acute infarction, or significant intracranial injury. No acute calvarial abnormality. Visualized paranasal sinuses and mastoids clear. Orbital soft tissues unremarkable. CT CERVICAL SPINE FINDINGS Diffuse degenerative facet disease throughout the cervical spine. Degenerative disc disease in the lower cervical spine. Prevertebral soft tissues are normal. Alignment is normal. No fracture. No epidural or paraspinal hematoma. IMPRESSION: No acute intracranial abnormality. Atrophy, chronic microvascular disease. Degenerative changes in the cervical spine. No acute bony abnormality. Electronically Signed   By: Rolm Baptise M.D.   On: 06/25/2015 09:58   Ct Femur Right Wo Contrast: 06/25/2015  CLINICAL DATA:  Leg pain. Pacemaker precludes MRI. Clinical concern for compartment syndrome. EXAM: CT OF THE RIGHT FEMUR WITHOUT CONTRAST; CT OF THE RIGHT TIBIA FIBULA WITHOUT CONTRAST TECHNIQUE: Multidetector CT imaging was performed according to the standard protocol. Multiplanar CT image reconstructions were also generated. COMPARISON:  None. FINDINGS: CT right femur: Right total hip prosthesis observed with a periprosthetic fracture tracking from the greater trochanter longitudinal along the stem. There is an oblique component which tracks medially and is displaced 1.4 cm posteromedial from the rest of the shaft. The tip of this oblique proximal medial fragment is about at the level of the tip of the implant. However, the fracture appears to extend slightly further in a longitudinal fashion posteromedially as shown on images 87 through 112 of series 1. Below this level the fracture plane is no longer well shown by CT. Bony demineralization is  present. Common is requested for compartment syndrome. Assessment of the upper thigh is especially problematic due to the extensive streak artifact from the implant which can obscure the subtle intramuscular effacement of adipose tissue and other signs of compartment syndrome. I see no specific evidence of compartment syndrome but compartmental pressures would be a much more reliable check. Further distally and below the level of the implant, the visualized muscle compartments appear unusually hypertrophic/ well-developed for age, but still with marbling of adipose tissue in the muscles which would argue against a compartment syndrome. There is a small knee effusion along with some prepatellar edema. Diffuse atherosclerotic calcification is observed. CT right lower leg: No visualized tubular fibular fracture is observed; the distal most tip of the fibula is excluded. Diffuse atherosclerosis. There is subcutaneous edema tracking in the calf diffusely. Fatty marbling in the muscle  tissues is present, slightly less notable in the tibialis anterior, but the other anterior compartmental muscles appear unremarkable and accordingly I am skeptical of compartment syndrome in the calf. IMPRESSION: 1. Periprosthetic fracture along the right hip implant, extending from the greater trochanter along the stem of the implant. This has a displaced component, with the medial fracture fragment about 1.4 cm posteromedial to the rest of the shaft at its distal tip. This distal tip is at about the level of the stem of the implant. There is also a longitudinal fracture component extending distally from the level of the tip of the implant within the shaft, which becomes indistinct several sent centimeters distally. 2. There is subcutaneous edema in the thigh and calf, but reasonably normal marbling of adipose tissue in the muscles arguing against compartment syndrome. Clearly muscle compartment pressures are more sensitive and specific  measure, and particularly in the upper thigh in the vicinity of the implant the streak artifact from the metal further reduces diagnostic sensitivity. If there is continued clinical concern for compartment syndrome than compartment manometry would be recommended. 3. Small knee effusion. 4. No gas tracking in the soft tissues, or specific findings of high concern for abscess. Electronically Signed   By: Van Clines M.D.   On: 06/25/2015 14:29    Dg Chest Port 1 View: 06/26/2015  CLINICAL DATA:  Sepsis and edema. EXAM: PORTABLE CHEST - 1 VIEW COMPARISON:  One-view chest 06/25/2015. FINDINGS: The heart is enlarged. Pulmonary arteries are enlarged. Atherosclerotic changes are again noted at the aorta. Minimal bibasilar airspace disease likely reflects atelectasis. There is no edema or effusion to suggest failure. Emphysematous changes are suggested. The visualized soft tissues and bony thorax are unremarkable. IMPRESSION: 1. Cardiomegaly without failure. 2. Atherosclerosis. 3. Minimal bibasilar airspace disease likely reflects atelectasis. Electronically Signed   By: San Morelle M.D.   On: 06/26/2015 07:34   Dg Chest Portable 1 View: 06/25/2015  CLINICAL DATA:  Found unresponsive, shortness of breath EXAM: PORTABLE CHEST 1 VIEW COMPARISON:  06/22/2007 FINDINGS: Cardiac shadow is stable. The lungs are well aerated bilaterally. No pneumothorax or focal infiltrate is seen. No acute fracture is noted. IMPRESSION: No acute abnormality seen. Electronically Signed   By: Inez Catalina M.D.   On: 06/25/2015 08:40   Dg Hip Unilat With Pelvis 2-3 Views Right: 06/25/2015  CLINICAL DATA:  Right hip pain after fall. Periprosthetic fracture on CT. EXAM: DG HIP (WITH OR WITHOUT PELVIS) 2-3V RIGHT COMPARISON:  CT femur earlier this day. FINDINGS: Right hip arthroplasty, with known periprosthetic fracture about the greater trochanter and femoral stem. This is better characterized on recently performed CT. The displaced  distal component is visualized. Femoral head component remains seated in the acetabular cup. Vascular calcifications are seen. IMPRESSION: Known periprosthetic fracture about the femoral stem of the right hip arthroplasty, better characterized on recent CT. Electronically Signed   By: Jeb Levering M.D.   On: 06/25/2015 21:07    Telemetry: Atrial fibrillation with rates in 90's - 120's.   ECHOCARDIOGRAM: Pending   ASSESSMENT AND PLAN  1. Preoperative Clearance for Right ORIF of right hip fracture - found down on the floor by nursing staff following a mechanical fall. CT Imaging shows a right periprosthetic fracture.  - echocardiogram is pending to assess LV function, wall motion, and possible valve abnormalities. - cyclic troponin values are pending. Would not be a candidate for ischemic evaluation at this time.  - with his severe anemia, thrombocytopenia, and current sepsis he  would be a high-risk candidate at this time for a surgical procedure. His advanced age also plays a factor. MD to examine for further assessment.  2. Chronic Atrial Fibrillation This patients CHA2DS2-VASc Score and unadjusted Ischemic Stroke Rate (% per year) is equal to 3.2 % stroke rate/year from a score of 3 (CHF, Age (2)). Was on Pradaxa PTA. Currently being held due to possible surgical procedure. - on Toprol-XL 75mg  daily PTA. Currently held due to hypotension. Not a candidate for Cardizem at this point either due to hypotension. Could consider low-dose Digoxin if his HR remains elevated for an extended period of time.  3. Chronic Diastolic CHF - listed in his medical records. No prior echo on file. - does not appear volume overloaded on physical exam - obtaining new echocardiogram  4. Right Periprosthetic Hip Fracture - Orthopedics following  5. Sepsis - WBC elevated to 25.7 this AM. Lactic Acid 14.89 on arrival, improved to 2.9 today. - On IV Vancomycin and Zosyn currently - per admitting team  6.  Severe Anemia - Hgb 6.5 this AM - receiving transfusion currently.   Signed, Erma Heritage, PA-C 06/26/2015, 12:00 PM Pager: [phone]  I have personally seen and examined this patient with Bernerd Pho, PA-C. I agree with the assessment and plan as outlined above. This elderly male is admitted after a fall sustaining hip fracture and found to have profound anemia, elevated WBC with possible sepsis. He has chronic atrial fibrillation on Pradaxa which is now on hold. Workup of anemia is underway. I agree that given his advanced age and other comorbidities that he is high risk for any surgical procedure. From a cardiac standpoint, will not resume Pradaxa. He has been slightly hypotensive on admission but BP better today with beta blocker on hold. HR is in the 80s, atrial fib on tele. Will follow up on on echo. Regardless, he is not likely a candidate for invasive cardiac evaluation and if surgery is necessary, as it seems to be in this case, I would proceed later this week as planned, despite high risk. His quality of life without the orthopedic procedure will likely not be good. He understands the risk involved.   Gerald Sandoval 06/26/2015 1:08 PM

## 2015-06-26 NOTE — Progress Notes (Addendum)
[facility] PROGRESS NOTE  TIGER KALBAUGH [id number] DOB: Sep 07, 1923 DOA: 06/25/2015 PCP: Jerlyn Ly, MD  Length of stay: 1   Assessment/Plan: Principal Problem:   Severe sepsis ([place]) Active Problems:   Atrial fibrillation (HCC)   Edema of right lower extremity   Acute renal failure (ARF) ([place])   History of right hip replacement   Hyperkalemia   Metabolic acidosis   Fracture    Brief summary 82 year old male with history of atrial fibrillation and right bundle branch block, arthritis status post right hip replacement that had become infected with bacteria sensitive to amoxicillin which he is on chronically for suppression, emphysema, chronic diastolic dysfunction, BPH who was found down by staff at [facility] this morning. He did not answer the phone the night prior so may have been down for more than 12-16 hours. The patient is confused and not able to contribute to history, however he is awake and alert. He does not seem to be able to reliably answer questions such as our you in pain or fever has nausea or vomiting. In the emergency department, his vital signs were notable for tachycardia, tachypnea, however his blood pressures are stable. His labs were notable for metabolic acidosis with a pH of 7.28, lactic acid of 15. The case was initially discussed with critical care however because the patient was DO NOT RESUSCITATE/DO NOT INTUBATE they declined admission. He had mild hyponatremia with AK I with a creatinine of 2.26 but was making urine in the emergency department. His CK was 444 and his initial troponin 0.04. Although had not had a positive anion gap acidosis and a decreased bicarbonate 11, his CBG was orally to 75 and he was not a diabetic. Had a leukocytosis to 24,000 and progressive anemia with a hemoglobin of 9.3. Chest x-ray was unremarkable. Urinalysis negative for signs of acute infection. CT head and neck demonstrated no acute change and no cervical  spine fracture. He received 2 L of IV fluids was started on vancomycin and Zosyn. He also received bicarbonate infusion for his acidosis.  Assessment and plan Acute blood loss anemia Patient saw Dr. Henrene Pastor on 01/12/15 for guaiac POSITIVE stool Patient has a history of anemia but no hemoglobin value documented on the office note Patient had a virtual colonoscopy to rule out significant mucosal abnormalities of the colon, this was done on 01/27/15 and it was negative, CT abdomen pelvis did not show any acute abnormality, Baseline hemoglobin unknown Hemoglobin drop from  9.3 > 6.5, suspect acute blood loss anemia, cannot rule out retroperitoneal bleed, getting 2 units today, repeat CBc post transfusion Patient should have a CT abdomen pelvis to rule out retroperitoneal bleed when   hemodynamically stable  Sepsis-elevated white count, no focal source of infection at this point Continue empiric antibiotics and follow blood culture results given history of chronic left hip infection Lactate improving Continue aggressive fluid boluses  Acute renal failure / metabolic acidosis, likely secondary to lactic acidosis, prerenal. Low suspicious for DKA, no known history of diabetes. Initial arterial pH 7.2. Patient received bicarbonate in ED. -Repeat ABG  shows pH of 7.48 Patient now has a normal anion gap, normal bicarbonate Acute renal failure improving with IV hydration  Hyperkalemia, K+ 5.3. Mild elevation. Monitor for now. Obtain BID CMETs.   Atrial fibrillation. ChadsVasc 2-3. Patient does not have a cardiologist, Heart rate initially in the 150s, now 110's  after fluid resuscitation -hold home Pradaxa for now (not taking PO and  may need emergent surgery Continue monitoring and stepdown 2-D echo Cardiology consultation Cycle cardiac enzymes, TSH  Rhabdomyolysis Improving with IV fluids Recheck CK, check TSH   Edema of right lower extremity. There is mild bruising lateral right thigh. Rule  out compartment syndrome or as we suspect worsening infectioPer orthopedics, suspicion for compartment syndrome is low   Chronic infected left  Hip . Patient on long-term (years) antibiotics     DVT prophylaxsis  SCDs   Code Status:      Code Status Orders        Start     Ordered   06/25/15 1548  Do not attempt resuscitation (DNR)   Continuous    Question Answer Comment  In the event of cardiac or respiratory ARREST Do not call a "code blue"   In the event of cardiac or respiratory ARREST Do not perform Intubation, CPR, defibrillation or ACLS   In the event of cardiac or respiratory ARREST Use medication by any route, position, wound care, and other measures to relive pain and suffering. May use oxygen, suction and manual treatment of airway obstruction as needed for comfort.      06/25/15 1548       Family Communication: Discussed in detail with the patient, all imaging results, lab results explained to the patient   Disposition Plan:       Consultants: Orthopedics Cardiology  Procedures None  ics: Anti-infectives    Start     Dose/Rate Route Frequency Ordered Stop   06/27/15 1000  vancomycin (VANCOCIN) IVPB 1000 mg/200 mL premix  Status:  Discontinued     1,000 mg 200 mL/hr over 60 Minutes Intravenous Every 48 hours 06/25/15 1358 06/25/15 1633   06/26/15 0930  vancomycin (VANCOCIN) IVPB 750 mg/150 ml premix     750 mg 150 mL/hr over 60 Minutes Intravenous Every 24 hours 06/25/15 1633     06/25/15 1730  piperacillin-tazobactam (ZOSYN) IVPB 3.375 g     3.375 g 12.5 mL/hr over 240 Minutes Intravenous 3 times per day 06/25/15 1633     06/25/15 1700  piperacillin-tazobactam (ZOSYN) IVPB 2.25 g  Status:  Discontinued     2.25 g 100 mL/hr over 30 Minutes Intravenous Every 8 hours 06/25/15 1358 06/25/15 1633   06/25/15 0930  vancomycin (VANCOCIN) 1,500 mg in sodium chloride 0.9 % 500 mL IVPB     1,500 mg 250 mL/hr over 120 Minutes Intravenous  Once 06/25/15 0849  06/25/15 1249   06/25/15 0900  piperacillin-tazobactam (ZOSYN) IVPB 3.375 g     3.375 g 100 mL/hr over 30 Minutes Intravenous  Once 06/25/15 0849 06/25/15 0930         HPI/Subjective: No active bleeding noted overnight, patient does complain of back pain, is in atrial fibrillation with blood pressure [id number] systolic, patient  family friend is in the room and patient is awake alert oriented and is able to provide most of the history Denies any chest pain or shortness of breath, denies any significant illnesses in the days preceding to this hospitalization Followed by Dr. Joylene Draft  Objective: Filed Vitals:   06/26/15 0700 06/26/15 0830 06/26/15 0844 06/26/15 0904  BP:  88/50 100/53 89/50  Pulse:  95 103   Temp: 99.1 F (37.3 C)  98.4 F (36.9 C) 98.4 F (36.9 C)  TempSrc: Oral  Oral Oral  Resp:  15 22   Height:      Weight:      SpO2:  100% 99%  Intake/Output Summary (Last 24 hours) at 06/26/15 1023 Last data filed at 06/26/15 0700  Gross per 24 hour  Intake 3340.42 ml  Output    850 ml  Net 2490.42 ml    Exam: frail-appearing male, awake and alert, no acute distress, blood crusted on his face with a large sharp on the right frontal scalp.  HEENT: Dry mucous membranes.  CV: Irregular rate and rhythm, 2/6 systolic murmur Pulm: Clear to auscultation bilaterally, no respiratory distress Abdomen: NABS, soft, nondistended, nontender MSK: Upper extremities normal tone and bulk, right lower extremity concerning for a tense thigh with some mild pinkness on the right hip over the greater trochanter but not brightly pink or erythematous. The patient does not cringe with palpation. Left lower extremity without edema, normal bulk.  Data Review   Micro Results Recent Results (from the past 240 hour(s))  MRSA PCR Screening     Status: None   Collection Time: 06/25/15  3:28 PM  Result Value Ref Range Status   MRSA by PCR NEGATIVE NEGATIVE Final    Comment:        The  GeneXpert MRSA Assay (FDA approved for NASAL specimens only), is one component of a comprehensive MRSA colonization surveillance program. It is not intended to diagnose MRSA infection nor to guide or monitor treatment for MRSA infections.     Radiology Reports Ct Head Wo Contrast  06/25/2015  CLINICAL DATA:  Found on floor, unwitnessed fall. Head laceration. Altered mental status, confusion EXAM: CT HEAD WITHOUT CONTRAST CT CERVICAL SPINE WITHOUT CONTRAST TECHNIQUE: Multidetector CT imaging of the head and cervical spine was performed following the standard protocol without intravenous contrast. Multiplanar CT image reconstructions of the cervical spine were also generated. COMPARISON:  None. FINDINGS: CT HEAD FINDINGS There is atrophy and chronic small vessel disease changes. No acute intracranial abnormality. Specifically, no hemorrhage, hydrocephalus, mass lesion, acute infarction, or significant intracranial injury. No acute calvarial abnormality. Visualized paranasal sinuses and mastoids clear. Orbital soft tissues unremarkable. CT CERVICAL SPINE FINDINGS Diffuse degenerative facet disease throughout the cervical spine. Degenerative disc disease in the lower cervical spine. Prevertebral soft tissues are normal. Alignment is normal. No fracture. No epidural or paraspinal hematoma. IMPRESSION: No acute intracranial abnormality. Atrophy, chronic microvascular disease. Degenerative changes in the cervical spine. No acute bony abnormality. Electronically Signed   By: Rolm Baptise M.D.   On: 06/25/2015 09:58   Ct Cervical Spine Wo Contrast  06/25/2015  CLINICAL DATA:  Found on floor, unwitnessed fall. Head laceration. Altered mental status, confusion EXAM: CT HEAD WITHOUT CONTRAST CT CERVICAL SPINE WITHOUT CONTRAST TECHNIQUE: Multidetector CT imaging of the head and cervical spine was performed following the standard protocol without intravenous contrast. Multiplanar CT image reconstructions of the  cervical spine were also generated. COMPARISON:  None. FINDINGS: CT HEAD FINDINGS There is atrophy and chronic small vessel disease changes. No acute intracranial abnormality. Specifically, no hemorrhage, hydrocephalus, mass lesion, acute infarction, or significant intracranial injury. No acute calvarial abnormality. Visualized paranasal sinuses and mastoids clear. Orbital soft tissues unremarkable. CT CERVICAL SPINE FINDINGS Diffuse degenerative facet disease throughout the cervical spine. Degenerative disc disease in the lower cervical spine. Prevertebral soft tissues are normal. Alignment is normal. No fracture. No epidural or paraspinal hematoma. IMPRESSION: No acute intracranial abnormality. Atrophy, chronic microvascular disease. Degenerative changes in the cervical spine. No acute bony abnormality. Electronically Signed   By: Rolm Baptise M.D.   On: 06/25/2015 09:58   Ct Femur Right Wo Contrast  06/25/2015  CLINICAL  DATA:  Leg pain. Pacemaker precludes MRI. Clinical concern for compartment syndrome. EXAM: CT OF THE RIGHT FEMUR WITHOUT CONTRAST; CT OF THE RIGHT TIBIA FIBULA WITHOUT CONTRAST TECHNIQUE: Multidetector CT imaging was performed according to the standard protocol. Multiplanar CT image reconstructions were also generated. COMPARISON:  None. FINDINGS: CT right femur: Right total hip prosthesis observed with a periprosthetic fracture tracking from the greater trochanter longitudinal along the stem. There is an oblique component which tracks medially and is displaced 1.4 cm posteromedial from the rest of the shaft. The tip of this oblique proximal medial fragment is about at the level of the tip of the implant. However, the fracture appears to extend slightly further in a longitudinal fashion posteromedially as shown on images 87 through 112 of series 1. Below this level the fracture plane is no longer well shown by CT. Bony demineralization is present. Common is requested for compartment syndrome.  Assessment of the upper thigh is especially problematic due to the extensive streak artifact from the implant which can obscure the subtle intramuscular effacement of adipose tissue and other signs of compartment syndrome. I see no specific evidence of compartment syndrome but compartmental pressures would be a much more reliable check. Further distally and below the level of the implant, the visualized muscle compartments appear unusually hypertrophic/ well-developed for age, but still with marbling of adipose tissue in the muscles which would argue against a compartment syndrome. There is a small knee effusion along with some prepatellar edema. Diffuse atherosclerotic calcification is observed. CT right lower leg: No visualized tubular fibular fracture is observed; the distal most tip of the fibula is excluded. Diffuse atherosclerosis. There is subcutaneous edema tracking in the calf diffusely. Fatty marbling in the muscle tissues is present, slightly less notable in the tibialis anterior, but the other anterior compartmental muscles appear unremarkable and accordingly I am skeptical of compartment syndrome in the calf. IMPRESSION: 1. Periprosthetic fracture along the right hip implant, extending from the greater trochanter along the stem of the implant. This has a displaced component, with the medial fracture fragment about 1.4 cm posteromedial to the rest of the shaft at its distal tip. This distal tip is at about the level of the stem of the implant. There is also a longitudinal fracture component extending distally from the level of the tip of the implant within the shaft, which becomes indistinct several sent centimeters distally. 2. There is subcutaneous edema in the thigh and calf, but reasonably normal marbling of adipose tissue in the muscles arguing against compartment syndrome. Clearly muscle compartment pressures are more sensitive and specific measure, and particularly in the upper thigh in the  vicinity of the implant the streak artifact from the metal further reduces diagnostic sensitivity. If there is continued clinical concern for compartment syndrome than compartment manometry would be recommended. 3. Small knee effusion. 4. No gas tracking in the soft tissues, or specific findings of high concern for abscess. Electronically Signed   By: Van Clines M.D.   On: 06/25/2015 14:29   Ct Tibia Fibula Right Wo Contrast  06/25/2015  CLINICAL DATA:  Leg pain. Pacemaker precludes MRI. Clinical concern for compartment syndrome. EXAM: CT OF THE RIGHT FEMUR WITHOUT CONTRAST; CT OF THE RIGHT TIBIA FIBULA WITHOUT CONTRAST TECHNIQUE: Multidetector CT imaging was performed according to the standard protocol. Multiplanar CT image reconstructions were also generated. COMPARISON:  None. FINDINGS: CT right femur: Right total hip prosthesis observed with a periprosthetic fracture tracking from the greater trochanter longitudinal along the stem.  There is an oblique component which tracks medially and is displaced 1.4 cm posteromedial from the rest of the shaft. The tip of this oblique proximal medial fragment is about at the level of the tip of the implant. However, the fracture appears to extend slightly further in a longitudinal fashion posteromedially as shown on images 87 through 112 of series 1. Below this level the fracture plane is no longer well shown by CT. Bony demineralization is present. Common is requested for compartment syndrome. Assessment of the upper thigh is especially problematic due to the extensive streak artifact from the implant which can obscure the subtle intramuscular effacement of adipose tissue and other signs of compartment syndrome. I see no specific evidence of compartment syndrome but compartmental pressures would be a much more reliable check. Further distally and below the level of the implant, the visualized muscle compartments appear unusually hypertrophic/ well-developed for  age, but still with marbling of adipose tissue in the muscles which would argue against a compartment syndrome. There is a small knee effusion along with some prepatellar edema. Diffuse atherosclerotic calcification is observed. CT right lower leg: No visualized tubular fibular fracture is observed; the distal most tip of the fibula is excluded. Diffuse atherosclerosis. There is subcutaneous edema tracking in the calf diffusely. Fatty marbling in the muscle tissues is present, slightly less notable in the tibialis anterior, but the other anterior compartmental muscles appear unremarkable and accordingly I am skeptical of compartment syndrome in the calf. IMPRESSION: 1. Periprosthetic fracture along the right hip implant, extending from the greater trochanter along the stem of the implant. This has a displaced component, with the medial fracture fragment about 1.4 cm posteromedial to the rest of the shaft at its distal tip. This distal tip is at about the level of the stem of the implant. There is also a longitudinal fracture component extending distally from the level of the tip of the implant within the shaft, which becomes indistinct several sent centimeters distally. 2. There is subcutaneous edema in the thigh and calf, but reasonably normal marbling of adipose tissue in the muscles arguing against compartment syndrome. Clearly muscle compartment pressures are more sensitive and specific measure, and particularly in the upper thigh in the vicinity of the implant the streak artifact from the metal further reduces diagnostic sensitivity. If there is continued clinical concern for compartment syndrome than compartment manometry would be recommended. 3. Small knee effusion. 4. No gas tracking in the soft tissues, or specific findings of high concern for abscess. Electronically Signed   By: Van Clines M.D.   On: 06/25/2015 14:29   Dg Chest Port 1 View  06/26/2015  CLINICAL DATA:  Sepsis and edema. EXAM:  PORTABLE CHEST - 1 VIEW COMPARISON:  One-view chest 06/25/2015. FINDINGS: The heart is enlarged. Pulmonary arteries are enlarged. Atherosclerotic changes are again noted at the aorta. Minimal bibasilar airspace disease likely reflects atelectasis. There is no edema or effusion to suggest failure. Emphysematous changes are suggested. The visualized soft tissues and bony thorax are unremarkable. IMPRESSION: 1. Cardiomegaly without failure. 2. Atherosclerosis. 3. Minimal bibasilar airspace disease likely reflects atelectasis. Electronically Signed   By: San Morelle M.D.   On: 06/26/2015 07:34   Dg Chest Portable 1 View  06/25/2015  CLINICAL DATA:  Found unresponsive, shortness of breath EXAM: PORTABLE CHEST 1 VIEW COMPARISON:  06/22/2007 FINDINGS: Cardiac shadow is stable. The lungs are well aerated bilaterally. No pneumothorax or focal infiltrate is seen. No acute fracture is noted. IMPRESSION: No acute  abnormality seen. Electronically Signed   By: Inez Catalina M.D.   On: 06/25/2015 08:40   Dg Hip Unilat With Pelvis 2-3 Views Right  06/25/2015  CLINICAL DATA:  Right hip pain after fall. Periprosthetic fracture on CT. EXAM: DG HIP (WITH OR WITHOUT PELVIS) 2-3V RIGHT COMPARISON:  CT femur earlier this day. FINDINGS: Right hip arthroplasty, with known periprosthetic fracture about the greater trochanter and femoral stem. This is better characterized on recently performed CT. The displaced distal component is visualized. Femoral head component remains seated in the acetabular cup. Vascular calcifications are seen. IMPRESSION: Known periprosthetic fracture about the femoral stem of the right hip arthroplasty, better characterized on recent CT. Electronically Signed   By: Jeb Levering M.D.   On: 06/25/2015 21:07     CBC  Recent Labs Lab 06/25/15 0832 06/25/15 1515 06/25/15 1600 06/25/15 1830 06/26/15 0328 06/26/15 0648  WBC 23.8* 21.1* 22.5* 24.4* 25.7* 24.3*  HGB 9.3* 8.1* 8.1* 7.9* 6.8*  6.5*  HCT 27.1* 22.4* 22.6* 22.0* 19.0* 17.8*  PLT 141* 115* 123* 121* 104* 95*  MCV 98.9 94.5 95.4 94.8 95.0 94.7  MCH 33.9 34.2* 34.2* 34.1* 34.0 34.6*  MCHC 34.3 36.2* 35.8 35.9 35.8 36.5*  RDW 13.5 13.2 13.2 13.4 13.7 13.6  LYMPHSABS 1.0  --  0.8  --   --   --   MONOABS 0.8  --  1.4*  --   --   --   EOSABS 0.0  --  0.0  --   --   --   BASOSABS 0.0  --  0.0  --   --   --     Chemistries   Recent Labs Lab 06/25/15 0832 06/25/15 1515 06/25/15 1830 06/26/15 0328 06/26/15 0648  NA 129* 131* 131* 132* 130*  K 5.3* 4.4 4.8 4.2 3.9  CL 93* 98* 95* 101 102  CO2 11* 25 25 23 23   GLUCOSE 275* 200* 168* 121* 123*  BUN 14 15 15 16 15   CREATININE 2.26* 1.77* 1.66* 1.42* 1.36*  CALCIUM 8.4* 7.7* 8.0* 7.6* 7.5*  AST 39 43* 47* 47* 47*  ALT <5* 20 20 20 21   ALKPHOS 45 37* 33* 32* 29*  BILITOT 0.5 0.4 0.3 0.4 0.5   ------------------------------------------------------------------------------------------------------------------ estimated creatinine clearance is 35.6 mL/min (by C-G formula based on Cr of 1.36). ------------------------------------------------------------------------------------------------------------------ No results for input(s): HGBA1C in the last 72 hours. ------------------------------------------------------------------------------------------------------------------ No results for input(s): CHOL, HDL, LDLCALC, TRIG, CHOLHDL, LDLDIRECT in the last 72 hours. ------------------------------------------------------------------------------------------------------------------  Recent Labs  06/25/15 0833  TSH 4.702*   ------------------------------------------------------------------------------------------------------------------ No results for input(s): VITAMINB12, FOLATE, FERRITIN, TIBC, IRON, RETICCTPCT in the last 72 hours.  Coagulation profile  Recent Labs Lab 06/25/15 1600  INR 1.97*    No results for input(s): DDIMER in the last 72 hours.  Cardiac  Enzymes  Recent Labs Lab 06/25/15 0832  TROPONINI 0.04*   ------------------------------------------------------------------------------------------------------------------ Invalid input(s): POCBNP   CBG:  Recent Labs Lab 06/25/15 0850  GLUCAP 236*       Studies: Ct Head Wo Contrast  06/25/2015  CLINICAL DATA:  Found on floor, unwitnessed fall. Head laceration. Altered mental status, confusion EXAM: CT HEAD WITHOUT CONTRAST CT CERVICAL SPINE WITHOUT CONTRAST TECHNIQUE: Multidetector CT imaging of the head and cervical spine was performed following the standard protocol without intravenous contrast. Multiplanar CT image reconstructions of the cervical spine were also generated. COMPARISON:  None. FINDINGS: CT HEAD FINDINGS There is atrophy and chronic small vessel disease changes. No acute intracranial abnormality.  Specifically, no hemorrhage, hydrocephalus, mass lesion, acute infarction, or significant intracranial injury. No acute calvarial abnormality. Visualized paranasal sinuses and mastoids clear. Orbital soft tissues unremarkable. CT CERVICAL SPINE FINDINGS Diffuse degenerative facet disease throughout the cervical spine. Degenerative disc disease in the lower cervical spine. Prevertebral soft tissues are normal. Alignment is normal. No fracture. No epidural or paraspinal hematoma. IMPRESSION: No acute intracranial abnormality. Atrophy, chronic microvascular disease. Degenerative changes in the cervical spine. No acute bony abnormality. Electronically Signed   By: Rolm Baptise M.D.   On: 06/25/2015 09:58   Ct Cervical Spine Wo Contrast  06/25/2015  CLINICAL DATA:  Found on floor, unwitnessed fall. Head laceration. Altered mental status, confusion EXAM: CT HEAD WITHOUT CONTRAST CT CERVICAL SPINE WITHOUT CONTRAST TECHNIQUE: Multidetector CT imaging of the head and cervical spine was performed following the standard protocol without intravenous contrast. Multiplanar CT image  reconstructions of the cervical spine were also generated. COMPARISON:  None. FINDINGS: CT HEAD FINDINGS There is atrophy and chronic small vessel disease changes. No acute intracranial abnormality. Specifically, no hemorrhage, hydrocephalus, mass lesion, acute infarction, or significant intracranial injury. No acute calvarial abnormality. Visualized paranasal sinuses and mastoids clear. Orbital soft tissues unremarkable. CT CERVICAL SPINE FINDINGS Diffuse degenerative facet disease throughout the cervical spine. Degenerative disc disease in the lower cervical spine. Prevertebral soft tissues are normal. Alignment is normal. No fracture. No epidural or paraspinal hematoma. IMPRESSION: No acute intracranial abnormality. Atrophy, chronic microvascular disease. Degenerative changes in the cervical spine. No acute bony abnormality. Electronically Signed   By: Rolm Baptise M.D.   On: 06/25/2015 09:58   Ct Femur Right Wo Contrast  06/25/2015  CLINICAL DATA:  Leg pain. Pacemaker precludes MRI. Clinical concern for compartment syndrome. EXAM: CT OF THE RIGHT FEMUR WITHOUT CONTRAST; CT OF THE RIGHT TIBIA FIBULA WITHOUT CONTRAST TECHNIQUE: Multidetector CT imaging was performed according to the standard protocol. Multiplanar CT image reconstructions were also generated. COMPARISON:  None. FINDINGS: CT right femur: Right total hip prosthesis observed with a periprosthetic fracture tracking from the greater trochanter longitudinal along the stem. There is an oblique component which tracks medially and is displaced 1.4 cm posteromedial from the rest of the shaft. The tip of this oblique proximal medial fragment is about at the level of the tip of the implant. However, the fracture appears to extend slightly further in a longitudinal fashion posteromedially as shown on images 87 through 112 of series 1. Below this level the fracture plane is no longer well shown by CT. Bony demineralization is present. Common is requested for  compartment syndrome. Assessment of the upper thigh is especially problematic due to the extensive streak artifact from the implant which can obscure the subtle intramuscular effacement of adipose tissue and other signs of compartment syndrome. I see no specific evidence of compartment syndrome but compartmental pressures would be a much more reliable check. Further distally and below the level of the implant, the visualized muscle compartments appear unusually hypertrophic/ well-developed for age, but still with marbling of adipose tissue in the muscles which would argue against a compartment syndrome. There is a small knee effusion along with some prepatellar edema. Diffuse atherosclerotic calcification is observed. CT right lower leg: No visualized tubular fibular fracture is observed; the distal most tip of the fibula is excluded. Diffuse atherosclerosis. There is subcutaneous edema tracking in the calf diffusely. Fatty marbling in the muscle tissues is present, slightly less notable in the tibialis anterior, but the other anterior compartmental muscles appear unremarkable and accordingly  I am skeptical of compartment syndrome in the calf. IMPRESSION: 1. Periprosthetic fracture along the right hip implant, extending from the greater trochanter along the stem of the implant. This has a displaced component, with the medial fracture fragment about 1.4 cm posteromedial to the rest of the shaft at its distal tip. This distal tip is at about the level of the stem of the implant. There is also a longitudinal fracture component extending distally from the level of the tip of the implant within the shaft, which becomes indistinct several sent centimeters distally. 2. There is subcutaneous edema in the thigh and calf, but reasonably normal marbling of adipose tissue in the muscles arguing against compartment syndrome. Clearly muscle compartment pressures are more sensitive and specific measure, and particularly in the  upper thigh in the vicinity of the implant the streak artifact from the metal further reduces diagnostic sensitivity. If there is continued clinical concern for compartment syndrome than compartment manometry would be recommended. 3. Small knee effusion. 4. No gas tracking in the soft tissues, or specific findings of high concern for abscess. Electronically Signed   By: Van Clines M.D.   On: 06/25/2015 14:29   Ct Tibia Fibula Right Wo Contrast  06/25/2015  CLINICAL DATA:  Leg pain. Pacemaker precludes MRI. Clinical concern for compartment syndrome. EXAM: CT OF THE RIGHT FEMUR WITHOUT CONTRAST; CT OF THE RIGHT TIBIA FIBULA WITHOUT CONTRAST TECHNIQUE: Multidetector CT imaging was performed according to the standard protocol. Multiplanar CT image reconstructions were also generated. COMPARISON:  None. FINDINGS: CT right femur: Right total hip prosthesis observed with a periprosthetic fracture tracking from the greater trochanter longitudinal along the stem. There is an oblique component which tracks medially and is displaced 1.4 cm posteromedial from the rest of the shaft. The tip of this oblique proximal medial fragment is about at the level of the tip of the implant. However, the fracture appears to extend slightly further in a longitudinal fashion posteromedially as shown on images 87 through 112 of series 1. Below this level the fracture plane is no longer well shown by CT. Bony demineralization is present. Common is requested for compartment syndrome. Assessment of the upper thigh is especially problematic due to the extensive streak artifact from the implant which can obscure the subtle intramuscular effacement of adipose tissue and other signs of compartment syndrome. I see no specific evidence of compartment syndrome but compartmental pressures would be a much more reliable check. Further distally and below the level of the implant, the visualized muscle compartments appear unusually hypertrophic/  well-developed for age, but still with marbling of adipose tissue in the muscles which would argue against a compartment syndrome. There is a small knee effusion along with some prepatellar edema. Diffuse atherosclerotic calcification is observed. CT right lower leg: No visualized tubular fibular fracture is observed; the distal most tip of the fibula is excluded. Diffuse atherosclerosis. There is subcutaneous edema tracking in the calf diffusely. Fatty marbling in the muscle tissues is present, slightly less notable in the tibialis anterior, but the other anterior compartmental muscles appear unremarkable and accordingly I am skeptical of compartment syndrome in the calf. IMPRESSION: 1. Periprosthetic fracture along the right hip implant, extending from the greater trochanter along the stem of the implant. This has a displaced component, with the medial fracture fragment about 1.4 cm posteromedial to the rest of the shaft at its distal tip. This distal tip is at about the level of the stem of the implant. There is also a  longitudinal fracture component extending distally from the level of the tip of the implant within the shaft, which becomes indistinct several sent centimeters distally. 2. There is subcutaneous edema in the thigh and calf, but reasonably normal marbling of adipose tissue in the muscles arguing against compartment syndrome. Clearly muscle compartment pressures are more sensitive and specific measure, and particularly in the upper thigh in the vicinity of the implant the streak artifact from the metal further reduces diagnostic sensitivity. If there is continued clinical concern for compartment syndrome than compartment manometry would be recommended. 3. Small knee effusion. 4. No gas tracking in the soft tissues, or specific findings of high concern for abscess. Electronically Signed   By: Van Clines M.D.   On: 06/25/2015 14:29   Dg Chest Port 1 View  06/26/2015  CLINICAL DATA:  Sepsis  and edema. EXAM: PORTABLE CHEST - 1 VIEW COMPARISON:  One-view chest 06/25/2015. FINDINGS: The heart is enlarged. Pulmonary arteries are enlarged. Atherosclerotic changes are again noted at the aorta. Minimal bibasilar airspace disease likely reflects atelectasis. There is no edema or effusion to suggest failure. Emphysematous changes are suggested. The visualized soft tissues and bony thorax are unremarkable. IMPRESSION: 1. Cardiomegaly without failure. 2. Atherosclerosis. 3. Minimal bibasilar airspace disease likely reflects atelectasis. Electronically Signed   By: San Morelle M.D.   On: 06/26/2015 07:34   Dg Chest Portable 1 View  06/25/2015  CLINICAL DATA:  Found unresponsive, shortness of breath EXAM: PORTABLE CHEST 1 VIEW COMPARISON:  06/22/2007 FINDINGS: Cardiac shadow is stable. The lungs are well aerated bilaterally. No pneumothorax or focal infiltrate is seen. No acute fracture is noted. IMPRESSION: No acute abnormality seen. Electronically Signed   By: Inez Catalina M.D.   On: 06/25/2015 08:40   Dg Hip Unilat With Pelvis 2-3 Views Right  06/25/2015  CLINICAL DATA:  Right hip pain after fall. Periprosthetic fracture on CT. EXAM: DG HIP (WITH OR WITHOUT PELVIS) 2-3V RIGHT COMPARISON:  CT femur earlier this day. FINDINGS: Right hip arthroplasty, with known periprosthetic fracture about the greater trochanter and femoral stem. This is better characterized on recently performed CT. The displaced distal component is visualized. Femoral head component remains seated in the acetabular cup. Vascular calcifications are seen. IMPRESSION: Known periprosthetic fracture about the femoral stem of the right hip arthroplasty, better characterized on recent CT. Electronically Signed   By: Jeb Levering M.D.   On: 06/25/2015 21:07      No results found for: HGBA1C Lab Results  Component Value Date   CREATININE 1.36* 06/26/2015       Scheduled Meds: . sodium chloride   Intravenous Once  .  sodium chloride   Intravenous Once  . heparin  5,000 Units Subcutaneous 3 times per day  . piperacillin-tazobactam (ZOSYN)  IV  3.375 g Intravenous 3 times per day  . sodium chloride  250 mL Intravenous Once  . sodium chloride flush  3 mL Intravenous Q12H  . vancomycin  750 mg Intravenous Q24H   Continuous Infusions: . sodium chloride 100 mL/hr at 06/26/15 0907    Principal Problem:   Severe sepsis [facility]) Active Problems:   Atrial fibrillation (HCC)   Edema of right lower extremity   Acute renal failure (ARF) ([place])   History of right hip replacement   Hyperkalemia   Metabolic acidosis   Fracture    Time spent: 45 minutes   [facility] Pager [phone]. If 7PM-7AM, please contact night-coverage at [email or web address], password [facility] 06/26/2015, 10:23 AM  LOS: 1 day          

## 2015-06-26 NOTE — Care Management Note (Signed)
Case Management Note  Patient Details  Name: Gerald Sandoval MRN: [id number] Date of Birth: 12-17-1923  Subjective/Objective:   Patient is from [facility], patient is for surgery for prosthetic hip fx, will need pt/ot eval after surgery.  NCM will cont to follow for dc needs.                  Action/Plan:   Expected Discharge Date:                  Expected Discharge Plan:  [place] (From [facility])  In-House Referral:  Clinical Social Work  Discharge planning Services  CM Consult  Post Acute Care Choice:    Choice offered to:     DME Arranged:    DME Agency:     HH Arranged:    [place] Agency:     Status of Service:  In process, will continue to follow  Medicare Important Message Given:    Date Medicare IM Given:    Medicare IM give by:    Date Additional Medicare IM Given:    Additional Medicare Important Message give by:     If discussed at [place] of Stay Meetings, dates discussed:    Additional Comments:  Gerald Mayo, RN 06/26/2015, 1:39 PM

## 2015-06-26 NOTE — Progress Notes (Signed)
   Subjective:  Patient reports pain as mild.  No events  Objective:   VITALS:   Filed Vitals:   06/26/15 0000 06/26/15 0501 06/26/15 0505 06/26/15 0535  BP: 96/37  82/56 95/49  Pulse: 84  82 109  Temp:  98.2 F (36.8 C)    TempSrc:  Oral    Resp: 23  21 29   Height:      Weight:      SpO2: 97%  99% 98%    Neurologically intact Sensation intact distally Intact pulses distally Dorsiflexion/Plantar flexion intact No cellulitis present Compartment soft  Hard of hearing   Lab Results  Component Value Date   WBC 24.3* 06/26/2015   HGB 6.5* 06/26/2015   HCT 17.8* 06/26/2015   MCV 94.7 06/26/2015   PLT 95* 06/26/2015     Assessment/Plan:  1. Right periprosthetic hip fx 2. Acute blood loss anemia 3. Afib  - acute blood loss from fracture and made worse by pradaxa - recommend transfusing RBCs - xrays show displaced periprosthetic hip fx - recommend ORIF - continue to allow INR to drift down to <1.5 for surgery - needs medical optimization prior to surgery - surgery will be wed vs thurs - I have spoken to [facility], Prudencio Pair regarding orthopedic treatment  Marianna Payment 06/26/2015, 7:58 AM [phone]

## 2015-06-27 ENCOUNTER — Inpatient Hospital Stay (HOSPITAL_COMMUNITY): Payer: Medicare Other

## 2015-06-27 ENCOUNTER — Encounter (HOSPITAL_COMMUNITY): Payer: Self-pay | Admitting: Radiology

## 2015-06-27 DIAGNOSIS — E872 Acidosis, unspecified: Secondary | ICD-10-CM | POA: Insufficient documentation

## 2015-06-27 DIAGNOSIS — R600 Localized edema: Secondary | ICD-10-CM

## 2015-06-27 DIAGNOSIS — Z515 Encounter for palliative care: Secondary | ICD-10-CM | POA: Insufficient documentation

## 2015-06-27 DIAGNOSIS — Z7189 Other specified counseling: Secondary | ICD-10-CM | POA: Insufficient documentation

## 2015-06-27 DIAGNOSIS — I482 Chronic atrial fibrillation: Secondary | ICD-10-CM

## 2015-06-27 LAB — COMPREHENSIVE METABOLIC PANEL
ALBUMIN: 2.5 g/dL — AB (ref 3.5–5.0)
ALK PHOS: 35 U/L — AB (ref 38–126)
ALT: 25 U/L (ref 17–63)
ALT: 25 U/L (ref 17–63)
ANION GAP: 8 (ref 5–15)
AST: 51 U/L — AB (ref 15–41)
AST: 49 U/L — AB (ref 15–41)
Albumin: 2.5 g/dL — ABNORMAL LOW (ref 3.5–5.0)
Alkaline Phosphatase: 36 U/L — ABNORMAL LOW (ref 38–126)
Anion gap: 7 (ref 5–15)
BILIRUBIN TOTAL: 1.2 mg/dL (ref 0.3–1.2)
BILIRUBIN TOTAL: 1.1 mg/dL (ref 0.3–1.2)
BUN: 12 mg/dL (ref 6–20)
BUN: 12 mg/dL (ref 6–20)
CALCIUM: 7.7 mg/dL — AB (ref 8.9–10.3)
CHLORIDE: 103 mmol/L (ref 101–111)
CO2: 22 mmol/L (ref 22–32)
CO2: 22 mmol/L (ref 22–32)
CREATININE: 1 mg/dL (ref 0.61–1.24)
Calcium: 7.5 mg/dL — ABNORMAL LOW (ref 8.9–10.3)
Chloride: 104 mmol/L (ref 101–111)
Creatinine, Ser: 0.99 mg/dL (ref 0.61–1.24)
GFR calc Af Amer: >60 mL/min (ref >60)
GFR calc non Af Amer: >60 mL/min (ref >60)
GLUCOSE: 94 mg/dL (ref 65–99)
Glucose, Bld: 95 mg/dL (ref 65–99)
Potassium: 3.3 mmol/L — ABNORMAL LOW (ref 3.5–5.1)
Potassium: 3.3 mmol/L — ABNORMAL LOW (ref 3.5–5.1)
SODIUM: 134 mmol/L — AB (ref 135–145)
Sodium: 132 mmol/L — ABNORMAL LOW (ref 135–145)
TOTAL PROTEIN: 4.5 g/dL — AB (ref 6.5–8.1)
TOTAL PROTEIN: 4.3 g/dL — AB (ref 6.5–8.1)

## 2015-06-27 LAB — CBC
HEMATOCRIT: 24 % — AB (ref 39.0–52.0)
HEMOGLOBIN: 8.4 g/dL — AB (ref 13.0–17.0)
MCH: 32.2 pg (ref 26.0–34.0)
MCHC: 35 g/dL (ref 30.0–36.0)
MCV: 92 fL (ref 78.0–100.0)
Platelets: 98 10*3/uL — ABNORMAL LOW (ref 150–400)
RBC: 2.61 MIL/uL — AB (ref 4.22–5.81)
RDW: 16.5 % — ABNORMAL HIGH (ref 11.5–15.5)
WBC: 16.3 10*3/uL — AB (ref 4.0–10.5)

## 2015-06-27 LAB — PREPARE RBC (CROSSMATCH)

## 2015-06-27 MED ORDER — SENNOSIDES-DOCUSATE SODIUM 8.6-50 MG PO TABS
1.0000 | ORAL_TABLET | Freq: Every day | ORAL | Status: DC
  Administered 2015-06-27 – 2015-07-02 (×5): 1 via ORAL
  Filled 2015-06-27 (×5): qty 1

## 2015-06-27 MED ORDER — POTASSIUM CHLORIDE CRYS ER 20 MEQ PO TBCR
40.0000 meq | EXTENDED_RELEASE_TABLET | Freq: Once | ORAL | Status: AC
  Administered 2015-06-27: 40 meq via ORAL
  Filled 2015-06-27: qty 2

## 2015-06-27 MED ORDER — LIDOCAINE HCL 1 % IJ SOLN
INTRAMUSCULAR | Status: AC
  Filled 2015-06-27: qty 20

## 2015-06-27 MED ORDER — VANCOMYCIN HCL IN DEXTROSE 1-5 GM/200ML-% IV SOLN
1000.0000 mg | INTRAVENOUS | Status: DC
  Administered 2015-06-28: 1000 mg via INTRAVENOUS
  Filled 2015-06-27: qty 200

## 2015-06-27 MED ORDER — IOHEXOL 300 MG/ML  SOLN
150.0000 mL | Freq: Once | INTRAMUSCULAR | Status: AC | PRN
  Administered 2015-06-27: 50 mL via INTRAVENOUS

## 2015-06-27 MED ORDER — POTASSIUM CHLORIDE CRYS ER 20 MEQ PO TBCR: 40.0000 meq | EXTENDED_RELEASE_TABLET | Freq: Once | ORAL | Status: DC

## 2015-06-27 MED ORDER — FENTANYL CITRATE (PF) 100 MCG/2ML IJ SOLN
INTRAMUSCULAR | Status: AC
  Filled 2015-06-27: qty 2

## 2015-06-27 MED ORDER — SODIUM CHLORIDE 0.9 % IV SOLN
Freq: Once | INTRAVENOUS | Status: AC
  Administered 2015-06-27: 10 mL via INTRAVENOUS

## 2015-06-27 MED ORDER — SODIUM CHLORIDE 0.9 % IV SOLN
INTRAVENOUS | Status: DC
  Administered 2015-06-27: 11:00:00 via INTRAVENOUS
  Filled 2015-06-27 (×2): qty 1000

## 2015-06-27 MED ORDER — FENTANYL CITRATE (PF) 100 MCG/2ML IJ SOLN
INTRAMUSCULAR | Status: AC | PRN
  Administered 2015-06-27: 50 ug via INTRAVENOUS

## 2015-06-27 MED ORDER — METOPROLOL TARTRATE 25 MG PO TABS
25.0000 mg | ORAL_TABLET | Freq: Two times a day (BID) | ORAL | Status: DC
  Administered 2015-06-27 – 2015-07-01 (×9): 25 mg via ORAL
  Filled 2015-06-27 (×9): qty 1

## 2015-06-27 MED ORDER — POTASSIUM CHLORIDE IN NACL 20-0.9 MEQ/L-% IV SOLN
INTRAVENOUS | Status: DC
  Administered 2015-06-27: 50 mL/h via INTRAVENOUS
  Filled 2015-06-27: qty 1000

## 2015-06-27 NOTE — Progress Notes (Signed)
SLP Cancellation Note  Patient Details Name: Gerald Sandoval MRN: [id number] DOB: [birth year]   Cancelled treatment:       Reason Eval/Treat Not Completed: Medical issues which prohibited therapy. NPO for procedure today - IVC filter, ORIF possibly tomorrow. Will follow for readiness.    Haward Pope, Katherene Ponto 06/27/2015, 9:40 AM

## 2015-06-27 NOTE — Progress Notes (Signed)
     SUBJECTIVE: No complaints today. No pain. No SOB  Tele: atrial fib  BP 140/75 mmHg  Pulse 82  Temp(Src) 98.4 F (36.9 C) (Oral)  Resp 23  Ht 5\' 8"  (1.727 m)  Wt 153 lb 3.5 oz (69.5 kg)  BMI 23.30 kg/m2  SpO2 99%  Intake/Output Summary (Last 24 hours) at 06/27/15 1249 Last data filed at 06/27/15 1248  Gross per 24 hour  Intake 2958.33 ml  Output   2050 ml  Net 908.33 ml    PHYSICAL EXAM General: Well developed, well nourished, in no acute distress. Alert and oriented x 3.  Psych:  Good affect, responds appropriately Neck: No JVD. No masses noted.  Lungs: Clear bilaterally with no wheezes or rhonci noted.  Heart: irreg irreg with no murmurs noted. Abdomen: Bowel sounds are present. Soft, non-tender.  Extremities: No lower extremity edema.   LABS: Basic Metabolic Panel:  Recent Labs  06/27/15 0432 06/27/15 0802  NA 134* 132*  K 3.3* 3.3*  CL 104 103  CO2 22 22  GLUCOSE 94 95  BUN 12 12  CREATININE 0.99 1.00  CALCIUM 7.7* 7.5*   CBC:  Recent Labs  06/25/15 0832  06/25/15 1600  06/26/15 1838 06/27/15 0432  WBC 23.8*  < > 22.5*  < > 20.6* 16.3*  NEUTROABS 22.0*  --  20.3*  --   --   --   HGB 9.3*  < > 8.1*  < > 8.3* 8.4*  HCT 27.1*  < > 22.6*  < > 23.4* 24.0*  MCV 98.9  < > 95.4  < > 91.8 92.0  PLT 141*  < > 123*  < > 95* 98*  < > = values in this interval not displayed. Cardiac Enzymes:  Recent Labs  06/25/15 0832 06/26/15 1837 06/26/15 2220  CKTOTAL 444* 1042*  --   TROPONINI 0.04* 0.07* 0.09*   Current Meds: . sodium chloride   Intravenous Once  . heparin  5,000 Units Subcutaneous 3 times per day  . piperacillin-tazobactam (ZOSYN)  IV  3.375 g Intravenous 3 times per day  . potassium chloride  40 mEq Oral Once  . sodium chloride flush  3 mL Intravenous Q12H  . vancomycin  750 mg Intravenous Q24H    ASSESSMENT AND PLAN: 82 yo male admitted after fall sustaining right hip fracture.   1. Preoperative Clearance for Right ORIF of  right hip fracture: Given his advanced age and other comorbidities, he is high risk for any surgical procedure. From a cardiac standpoint, will not resume Pradaxa. BP is stable. HR is in the 80s, atrial fib on tele. Will follow up on on echo. Regardless of findings on echo, he is not likely a candidate for invasive cardiac evaluation and if surgery is necessary, as it seems to be in this case, I would proceed later this week as planned, despite high risk. His quality of life without the orthopedic procedure will likely not be good. He understands the risk involved.   2. Chronic Atrial Fibrillation: Rate is controlled. This patients CHA2DS2-VASc Score is 3. Was on Pradaxa PTA. Currently being held due to possible surgical procedure. Resume beta blocker today.    3. Right Periprosthetic Hip Fracture: Orthopedics following  5. Severe Anemia: H/H stable. Transfusion planned per surgical team.     MCALHANY,CHRISTOPHER  1/31/201712:49 PM

## 2015-06-27 NOTE — Sedation Documentation (Signed)
Patient denies pain and is resting comfortably.  

## 2015-06-27 NOTE — Consult Note (Signed)
Chief Complaint: Patient was seen in consultation today for placement of retrievable IVC filter Chief Complaint  Patient presents with  . Fall  . Altered Mental Status  . Head Injury  . Head Laceration   at the request of Dr Allyson Sabal  Referring Physician(s): [facility]  History of Present Illness: Gerald Sandoval is a 82 y.o. male   Pt with Hx afib and on Pradaxa Lives in SNF Found down 1/29 in room [place] back into closet after tripping  Work up shows new periprosthetic fx at Rt hip implant (replaced 2007) Pt also complained of Rt leg pain and swelling off and on for months  1/30 vascular BLE veins: VASCULAR LAB PRELIMINARY PRELIMINARY PRELIMINARY PRELIMINARY  Bilateral lower extremity venous Doppler has been completed.   Right:Acute thrombus- Femoral Vein prox, mid and distally - DVT noted. No evidence of superficial thrombosis. No Baker's cyst.  Left leg no evidence of DVT. No evidence of superificial thrombus.No baker's cyst   Now off Pradaxa secondary fall risk Chronic anemia CT neg for retroperitoneal hematoma Plan for possible Rt hip surgery soon Request made for retrievable IVC filter  Placement per Dr Allyson Sabal Dr Kathlene Cote has reviewed case Approves procedure    Past Medical History  Diagnosis Date  . Permanent atrial fibrillation ([place])   . Osteoarthritis   . Gastritis   . Gastric ulcer   . History of colonic polyps   . Dysphagia     unspecified  . Emphysema   . Diastolic dysfunction   . BPH (benign prostatic hyperplasia)   . Rotator cuff injury   . Hernia     Rt. Inguinial  . RBBB (right bundle branch block)   . Kidney stones   . Osteoporosis   . Abscess of left hip     infection left hip  . Hypogonadism in male   . Anemia   . Colon polyp     Past Surgical History  Procedure Laterality Date  . Transurethral resection of prostate    . Inguinal hernia repair      RT  . Total hip arthroplasty      Both hips; Left in 2004, Right in 2007  .  Resection of ribs      Allergies: Primidone  Medications: Prior to Admission medications   Medication Sig Start Date End Date Taking? Authorizing Provider  amoxicillin (AMOXIL) 500 MG tablet Take 500 mg by mouth 2 (two) times daily.     Yes Historical Provider, MD  Cyanocobalamin (VITAMIN B-12) 5000 MCG SUBL Place 1 tablet under the tongue daily.     Yes Historical Provider, MD  metoprolol (TOPROL-XL) 50 MG 24 hr tablet Take 1.5 tablets (75 mg total) by mouth daily. 04/15/11  Yes Thompson Grayer, MD  sulfamethoxazole-trimethoprim (BACTRIM DS) 800-160 MG per tablet Take 1 tablet by mouth 2 (two) times daily.     Yes Historical Provider, MD  tamsulosin (FLOMAX) 0.4 MG CAPS capsule Take 0.4 mg by mouth.   Yes Historical Provider, MD  dabigatran (PRADAXA) 150 MG CAPS Take 150 mg by mouth every 12 (twelve) hours.      Historical Provider, MD  ergocalciferol (VITAMIN D2) 50000 UNITS capsule Take 50,000 Units by mouth as directed.      Historical Provider, MD  ferrous sulfate (IRON SUPPLEMENT) 325 (65 FE) MG tablet Take 325 mg by mouth daily with breakfast.      Historical Provider, MD  finasteride (PROSCAR) 5 MG tablet Take 5 mg by mouth daily.  Historical Provider, MD  potassium phosphate, monobasic, (K-PHOS ORIGINAL) 500 MG tablet Take 1,250 mg by mouth daily.      Historical Provider, MD     Family History  Problem Relation Age of Onset  . Colon cancer Maternal Uncle   . Diabetes Maternal Aunt     Social History   Social History  . Marital Status: Single    Spouse Name: N/A  . Number of Children: 0  . Years of Education: N/A   Occupational History  . Retired: [employer]    Social History Main Topics  . Smoking status: Never Smoker   . Smokeless tobacco: Never Used  . Alcohol Use: No  . Drug Use: No  . Sexual Activity: Not Asked   Other Topics Concern  . None   Social History Narrative   Patient lives in [place] alone.   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   His closest living relative is his sister who is 66 and lives in nursing home.   No caffeine   Retired [employer]: A 12 point ROS discussed and pertinent positives are indicated in the HPI above.  All other systems are negative.  Review of Systems  Constitutional: Positive for activity change. Negative for fever, appetite change and fatigue.  Respiratory: Negative for cough, choking, chest tightness and shortness of breath.   Cardiovascular: Negative for chest pain.  Gastrointestinal: Negative for abdominal pain.  Musculoskeletal: Positive for gait problem. Negative for back pain.  Neurological: Positive for weakness.  Psychiatric/Behavioral: Negative for behavioral problems and confusion.    Vital Signs: BP 135/64 mmHg  Pulse 81  Temp(Src) 98.4 F (36.9 C) (Oral)  Resp 19  Ht 5\' 8"  (1.727 m)  Wt 153 lb 3.5 oz (69.5 kg)  BMI 23.30 kg/m2  SpO2 100%  Physical Exam  Constitutional: He is oriented to person, place, and time.  Cardiovascular: Regular rhythm.   Irreg rate  Pulmonary/Chest: Effort normal and breath sounds normal. He has no wheezes.  Abdominal: Soft. Bowel sounds are normal.  Musculoskeletal: Normal range of motion. He exhibits edema.  Minimal Rt leg swelling NT  Neurological: He is alert and oriented to person, place, and time.  Skin: Skin is warm and dry.  Psychiatric: He has a normal mood and affect. His behavior is normal. Judgment and thought content normal.  Nursing note and vitals reviewed.   Mallampati Score:  MD Evaluation Airway: WNL Heart: WNL Abdomen: WNL Chest/ Lungs: WNL ASA  Classification: 3 Mallampati/Airway Score: One  Imaging: Ct Abdomen Pelvis Wo Contrast  06/26/2015  CLINICAL DATA:  Acute anemia. Evaluate for retroperitoneal bleed. Severe sepsis with acute renal failure. EXAM: CT ABDOMEN AND PELVIS WITHOUT CONTRAST TECHNIQUE: Multidetector CT imaging of the  abdomen and pelvis was performed following the standard protocol without IV contrast. COMPARISON:  CT virtual colonoscopy 01/27/2015. Abdominal pelvic CT 11/01/2011. FINDINGS: Lower chest: There are new small dependent low-density pleural effusions bilaterally with associated patchy atelectasis at both lung bases. The heart remains enlarged. Hepatobiliary: As evaluated in the noncontrast state, the liver appears unremarkable. The gallbladder appears unremarkable. Previously noted biliary dilatation appears improved. Pancreas: Appears atrophied. No evidence of focal abnormality or surrounding inflammation. Spleen: Stable appearance with multiple small granulomas. Adrenals/Urinary Tract: Stable without suspicious findings. There is stable subcapsular calcification posteriorly in the mid left kidney and a stable interpolar cyst in the right kidney, measuring up to  6.2 cm. No evidence of hydronephrosis. Stable tiny calculi in the lower pole of the left kidney. The bladder is partly obscured by artifact from the bilateral total hip arthroplasties, although demonstrates no abnormality. Stomach/Bowel: No evidence of bowel wall thickening, distention or surrounding inflammatory change. Vascular/Lymphatic: There are no enlarged abdominal or pelvic lymph nodes. There is diffuse atherosclerosis of the aorta, its branches and the iliac arteries. No evidence of retroperitoneal hematoma. Reproductive: Partly obscured by artifact from total hip arthroplasties. Grossly stable. Other: Mild subcutaneous edema within the abdominal wall bilaterally. Apparent asymmetric enlargement of the proximal right thigh without demonstrated focal hematoma. No evidence of hemoperitoneum. Musculoskeletal: No acute or significant osseous findings. Chronic appearing superior endplate compression deformities are noted at T10, T12 and L2. Previous bilateral total hip arthroplasty. IMPRESSION: 1. No evidence of retroperitoneal hematoma. There is mild  asymmetric enlargement of the proximal right thigh without apparent hematoma. Correlate clinically. 2. Generalized soft tissue edema with new bilateral pleural effusions suspicious for anasarca. 3. Stable subcapsular calcification posteriorly in the left kidney and right renal cyst. No evidence of hydronephrosis or ureteral calculus. Electronically Signed   By: Richardean Sale M.D.   On: 06/26/2015 21:03   Ct Head Wo Contrast  06/25/2015  CLINICAL DATA:  Found on floor, unwitnessed fall. Head laceration. Altered mental status, confusion EXAM: CT HEAD WITHOUT CONTRAST CT CERVICAL SPINE WITHOUT CONTRAST TECHNIQUE: Multidetector CT imaging of the head and cervical spine was performed following the standard protocol without intravenous contrast. Multiplanar CT image reconstructions of the cervical spine were also generated. COMPARISON:  None. FINDINGS: CT HEAD FINDINGS There is atrophy and chronic small vessel disease changes. No acute intracranial abnormality. Specifically, no hemorrhage, hydrocephalus, mass lesion, acute infarction, or significant intracranial injury. No acute calvarial abnormality. Visualized paranasal sinuses and mastoids clear. Orbital soft tissues unremarkable. CT CERVICAL SPINE FINDINGS Diffuse degenerative facet disease throughout the cervical spine. Degenerative disc disease in the lower cervical spine. Prevertebral soft tissues are normal. Alignment is normal. No fracture. No epidural or paraspinal hematoma. IMPRESSION: No acute intracranial abnormality. Atrophy, chronic microvascular disease. Degenerative changes in the cervical spine. No acute bony abnormality. Electronically Signed   By: Rolm Baptise M.D.   On: 06/25/2015 09:58   Ct Cervical Spine Wo Contrast  06/25/2015  CLINICAL DATA:  Found on floor, unwitnessed fall. Head laceration. Altered mental status, confusion EXAM: CT HEAD WITHOUT CONTRAST CT CERVICAL SPINE WITHOUT CONTRAST TECHNIQUE: Multidetector CT imaging of the head  and cervical spine was performed following the standard protocol without intravenous contrast. Multiplanar CT image reconstructions of the cervical spine were also generated. COMPARISON:  None. FINDINGS: CT HEAD FINDINGS There is atrophy and chronic small vessel disease changes. No acute intracranial abnormality. Specifically, no hemorrhage, hydrocephalus, mass lesion, acute infarction, or significant intracranial injury. No acute calvarial abnormality. Visualized paranasal sinuses and mastoids clear. Orbital soft tissues unremarkable. CT CERVICAL SPINE FINDINGS Diffuse degenerative facet disease throughout the cervical spine. Degenerative disc disease in the lower cervical spine. Prevertebral soft tissues are normal. Alignment is normal. No fracture. No epidural or paraspinal hematoma. IMPRESSION: No acute intracranial abnormality. Atrophy, chronic microvascular disease. Degenerative changes in the cervical spine. No acute bony abnormality. Electronically Signed   By: Rolm Baptise M.D.   On: 06/25/2015 09:58   Ct Femur Right Wo Contrast  06/25/2015  CLINICAL DATA:  Leg pain. Pacemaker precludes MRI. Clinical concern for compartment syndrome. EXAM: CT OF THE RIGHT FEMUR WITHOUT CONTRAST; CT OF THE RIGHT TIBIA FIBULA WITHOUT CONTRAST  TECHNIQUE: Multidetector CT imaging was performed according to the standard protocol. Multiplanar CT image reconstructions were also generated. COMPARISON:  None. FINDINGS: CT right femur: Right total hip prosthesis observed with a periprosthetic fracture tracking from the greater trochanter longitudinal along the stem. There is an oblique component which tracks medially and is displaced 1.4 cm posteromedial from the rest of the shaft. The tip of this oblique proximal medial fragment is about at the level of the tip of the implant. However, the fracture appears to extend slightly further in a longitudinal fashion posteromedially as shown on images 87 through 112 of series 1. Below  this level the fracture plane is no longer well shown by CT. Bony demineralization is present. Common is requested for compartment syndrome. Assessment of the upper thigh is especially problematic due to the extensive streak artifact from the implant which can obscure the subtle intramuscular effacement of adipose tissue and other signs of compartment syndrome. I see no specific evidence of compartment syndrome but compartmental pressures would be a much more reliable check. Further distally and below the level of the implant, the visualized muscle compartments appear unusually hypertrophic/ well-developed for age, but still with marbling of adipose tissue in the muscles which would argue against a compartment syndrome. There is a small knee effusion along with some prepatellar edema. Diffuse atherosclerotic calcification is observed. CT right lower leg: No visualized tubular fibular fracture is observed; the distal most tip of the fibula is excluded. Diffuse atherosclerosis. There is subcutaneous edema tracking in the calf diffusely. Fatty marbling in the muscle tissues is present, slightly less notable in the tibialis anterior, but the other anterior compartmental muscles appear unremarkable and accordingly I am skeptical of compartment syndrome in the calf. IMPRESSION: 1. Periprosthetic fracture along the right hip implant, extending from the greater trochanter along the stem of the implant. This has a displaced component, with the medial fracture fragment about 1.4 cm posteromedial to the rest of the shaft at its distal tip. This distal tip is at about the level of the stem of the implant. There is also a longitudinal fracture component extending distally from the level of the tip of the implant within the shaft, which becomes indistinct several sent centimeters distally. 2. There is subcutaneous edema in the thigh and calf, but reasonably normal marbling of adipose tissue in the muscles arguing against  compartment syndrome. Clearly muscle compartment pressures are more sensitive and specific measure, and particularly in the upper thigh in the vicinity of the implant the streak artifact from the metal further reduces diagnostic sensitivity. If there is continued clinical concern for compartment syndrome than compartment manometry would be recommended. 3. Small knee effusion. 4. No gas tracking in the soft tissues, or specific findings of high concern for abscess. Electronically Signed   By: Van Clines M.D.   On: 06/25/2015 14:29   Ct Tibia Fibula Right Wo Contrast  06/25/2015  CLINICAL DATA:  Leg pain. Pacemaker precludes MRI. Clinical concern for compartment syndrome. EXAM: CT OF THE RIGHT FEMUR WITHOUT CONTRAST; CT OF THE RIGHT TIBIA FIBULA WITHOUT CONTRAST TECHNIQUE: Multidetector CT imaging was performed according to the standard protocol. Multiplanar CT image reconstructions were also generated. COMPARISON:  None. FINDINGS: CT right femur: Right total hip prosthesis observed with a periprosthetic fracture tracking from the greater trochanter longitudinal along the stem. There is an oblique component which tracks medially and is displaced 1.4 cm posteromedial from the rest of the shaft. The tip of this oblique proximal medial fragment  is about at the level of the tip of the implant. However, the fracture appears to extend slightly further in a longitudinal fashion posteromedially as shown on images 87 through 112 of series 1. Below this level the fracture plane is no longer well shown by CT. Bony demineralization is present. Common is requested for compartment syndrome. Assessment of the upper thigh is especially problematic due to the extensive streak artifact from the implant which can obscure the subtle intramuscular effacement of adipose tissue and other signs of compartment syndrome. I see no specific evidence of compartment syndrome but compartmental pressures would be a much more reliable  check. Further distally and below the level of the implant, the visualized muscle compartments appear unusually hypertrophic/ well-developed for age, but still with marbling of adipose tissue in the muscles which would argue against a compartment syndrome. There is a small knee effusion along with some prepatellar edema. Diffuse atherosclerotic calcification is observed. CT right lower leg: No visualized tubular fibular fracture is observed; the distal most tip of the fibula is excluded. Diffuse atherosclerosis. There is subcutaneous edema tracking in the calf diffusely. Fatty marbling in the muscle tissues is present, slightly less notable in the tibialis anterior, but the other anterior compartmental muscles appear unremarkable and accordingly I am skeptical of compartment syndrome in the calf. IMPRESSION: 1. Periprosthetic fracture along the right hip implant, extending from the greater trochanter along the stem of the implant. This has a displaced component, with the medial fracture fragment about 1.4 cm posteromedial to the rest of the shaft at its distal tip. This distal tip is at about the level of the stem of the implant. There is also a longitudinal fracture component extending distally from the level of the tip of the implant within the shaft, which becomes indistinct several sent centimeters distally. 2. There is subcutaneous edema in the thigh and calf, but reasonably normal marbling of adipose tissue in the muscles arguing against compartment syndrome. Clearly muscle compartment pressures are more sensitive and specific measure, and particularly in the upper thigh in the vicinity of the implant the streak artifact from the metal further reduces diagnostic sensitivity. If there is continued clinical concern for compartment syndrome than compartment manometry would be recommended. 3. Small knee effusion. 4. No gas tracking in the soft tissues, or specific findings of high concern for abscess.  Electronically Signed   By: Van Clines M.D.   On: 06/25/2015 14:29   Dg Chest Port 1 View  06/26/2015  CLINICAL DATA:  Sepsis and edema. EXAM: PORTABLE CHEST - 1 VIEW COMPARISON:  One-view chest 06/25/2015. FINDINGS: The heart is enlarged. Pulmonary arteries are enlarged. Atherosclerotic changes are again noted at the aorta. Minimal bibasilar airspace disease likely reflects atelectasis. There is no edema or effusion to suggest failure. Emphysematous changes are suggested. The visualized soft tissues and bony thorax are unremarkable. IMPRESSION: 1. Cardiomegaly without failure. 2. Atherosclerosis. 3. Minimal bibasilar airspace disease likely reflects atelectasis. Electronically Signed   By: San Morelle M.D.   On: 06/26/2015 07:34   Dg Chest Portable 1 View  06/25/2015  CLINICAL DATA:  Found unresponsive, shortness of breath EXAM: PORTABLE CHEST 1 VIEW COMPARISON:  06/22/2007 FINDINGS: Cardiac shadow is stable. The lungs are well aerated bilaterally. No pneumothorax or focal infiltrate is seen. No acute fracture is noted. IMPRESSION: No acute abnormality seen. Electronically Signed   By: Inez Catalina M.D.   On: 06/25/2015 08:40   Dg Hip Unilat With Pelvis 2-3 Views Right  06/25/2015  CLINICAL DATA:  Right hip pain after fall. Periprosthetic fracture on CT. EXAM: DG HIP (WITH OR WITHOUT PELVIS) 2-3V RIGHT COMPARISON:  CT femur earlier this day. FINDINGS: Right hip arthroplasty, with known periprosthetic fracture about the greater trochanter and femoral stem. This is better characterized on recently performed CT. The displaced distal component is visualized. Femoral head component remains seated in the acetabular cup. Vascular calcifications are seen. IMPRESSION: Known periprosthetic fracture about the femoral stem of the right hip arthroplasty, better characterized on recent CT. Electronically Signed   By: Jeb Levering M.D.   On: 06/25/2015 21:07    Labs:  CBC:  Recent Labs   06/26/15 0328 06/26/15 0648 06/26/15 1838 06/27/15 0432  WBC 25.7* 24.3* 20.6* 16.3*  HGB 6.8* 6.5* 8.3* 8.4*  HCT 19.0* 17.8* 23.4* 24.0*  PLT 104* 95* 95* 98*    COAGS:  Recent Labs  06/25/15 1600  INR 1.97*  APTT 51*    BMP:  Recent Labs  06/26/15 0648 06/26/15 1837 06/27/15 0432 06/27/15 0802  NA 130* 136 134* 132*  K 3.9 3.8 3.3* 3.3*  CL 102 103 104 103  CO2 23 24 22 22   GLUCOSE 123* 116* 94 95  BUN 15 15 12 12   CALCIUM 7.5* 7.7* 7.7* 7.5*  CREATININE 1.36* 1.19 0.99 1.00  GFRNONAA 44* 52* >60 >60  GFRAA 52* >60 >60 >60    LIVER FUNCTION TESTS:  Recent Labs  06/26/15 0648 06/26/15 1837 06/27/15 0432 06/27/15 0802  BILITOT 0.5 1.7* 1.2 1.1  AST 47* 52* 51* 49*  ALT 21 22 25 25   ALKPHOS 29* 31* 35* 36*  PROT 4.3* 4.1* 4.5* 4.3*  ALBUMIN 2.5* 2.5* 2.5* 2.5*    TUMOR MARKERS: No results for input(s): AFPTM, CEA, CA199, CHROMGRNA in the last 8760 hours.  Assessment and Plan:  + RLE DVT Was on Pradaxa for A fib Fall at home Now off Pradaxa for possible surgery Rt hip fx (and fall risk) Scheduled for retrievable inferior vena cava filter placement Risks and Benefits discussed with the patient including, but not limited to bleeding, infection, contrast induced renal failure, filter fracture or migration which can lead to emergency surgery or even death, strut penetration with damage or irritation to adjacent structures and caval thrombosis. All of the patient's questions were answered, patient is agreeable to proceed. Consent signed and in chart.   Thank you for this interesting consult.  I greatly enjoyed meeting Gerald Sandoval and look forward to participating in their care.  A copy of this report was sent to the requesting provider on this date.  Electronically Signed: Monia Sabal A 06/27/2015, 10:28 AM   I spent a total of 40 Minutes    in face to face in clinical consultation, greater than 50% of which was counseling/coordinating care  for IVC filter placement

## 2015-06-27 NOTE — Consult Note (Signed)
Consultation Note Date: 06/27/2015   Patient Name: Gerald Sandoval  DOB: 12-Dec-1923  MRN: [id number]  Age / Sex: 82 y.o., male  PCP: Crist Infante, MD Referring Physician: Reyne Dumas, MD  Reason for Consultation: Establishing goals of care    Clinical Assessment/Narrative: Patient is a 82 year old woman with a past medical history significant for atrial fibrillation, diastolic heart failure, history of bilateral hip replacements. The patient lives in an independent living facility. The patient states that he had switched off the lights in his room and was trying to get to his bed when he fell in his room in the dark by his closet. He hit his head. He was brought into the hospital. Hospital course complicated by findings of right hip fracture, acute blood loss anemia, deep vein thrombosis, atrial fibrillation.  Palliative care consult for goals of care discussions, establishment of healthcare power of attorney agent. Patient arrived with report from his nursing facility showing healthcare power of attorney document which designates his niece Prudencio Pair at [phone] as his healthcare power of attorney agent. The patient is a retired [employer]. He states that the Reita Cliche is his savior. His sister was his healthcare power of attorney agent but she is now deceased. Now his healthcare power of attorney is his niece Prudencio Pair. Patient however currently is decisional awake alert answers all questions appropriately.  Patient complains of dysphagia. He states he has had esophageal dilations in the past. He states he takes antibiotics on a daily basis.  Patient also complains of lack of appetite/poor appetite and constipation.  Goals of care: Discussed with the patient and his niece healthcare power of attorney agent Prudencio Pair present at the bedside about the current context of this hospitalization. Goals of care attempted to be  elicited. Active listening and supportive care provided. Extensive discussions later, patient elects to proceed with surgery to repair his hip and placement of IVC filter. Even though he was unsteady, the patient states he used to be up and about in his independent living AT time. He does not fathom being in bed all the time. Voiced understanding and supported the patient's decision-making.    Contacts/Participants in Discussion: Primary Decision Maker:    patient, then niece Prudencio Pair at [phone]  Relationship to Patient  HCPOA: yes    SUMMARY OF RECOMMENDATIONS: DO NOT RESUSCITATE Proceed with IVC filter placement, orthopedic surgery when deemed medically appropriate. Cardiology and orthopedic surgery notes reviewed. Appreciate input. Modifier bowel regimen   consider speech therapy evaluation after patient's orthopedic surgery towards the end of this hospitalization.   Code Status/Advance Care Planning: DNR    Code Status Orders        Start     Ordered   06/25/15 [id number]  Do not attempt resuscitation (DNR)   Continuous    Question Answer Comment  In the event of cardiac or respiratory ARREST Do not call a "code blue"   In the event of cardiac or respiratory ARREST Do not perform Intubation, CPR, defibrillation or ACLS   In the event of cardiac or respiratory ARREST Use medication by any route, position, wound care, and other measures to relive pain and suffering. May use oxygen, suction and manual treatment of airway obstruction as needed for comfort.      06/25/15 1548    Code Status History    Date Active Date Inactive Code Status Order ID Comments User Context   06/25/2015 10:04 AM 06/25/2015  3:48 PM DNR [id number]  Leo Grosser,  MD ED    Advance Directive Documentation        Most Recent Value   Type of Advance Directive  Healthcare Power of Attorney   Pre-existing out of facility DNR order (yellow form or pink MOST form)     "MOST" Form in Place?        Other  Directives:Advanced Directive  Symptom Management:   Continue current treatment   Palliative Prophylaxis:   Delirium Protocol   Psycho-social/Spiritual:  Support System: [place] Desire for further Chaplaincy support:no Additional Recommendations: Caregiving  Support/Resources  Prognosis: Unable to determine  Discharge Planning: Pending surgery, hospitalization course likely skilled nursing facility for rehabilitation.    Chief Complaint/ Primary Diagnoses: Present on Admission:  . Severe sepsis ([place]) . Atrial fibrillation ([place])  I have reviewed the medical record, interviewed the patient and family, and examined the patient. The following aspects are pertinent.  Past Medical History  Diagnosis Date  . Permanent atrial fibrillation ([place])   . Osteoarthritis   . Gastritis   . Gastric ulcer   . History of colonic polyps   . Dysphagia     unspecified  . Emphysema   . Diastolic dysfunction   . BPH (benign prostatic hyperplasia)   . Rotator cuff injury   . Hernia     Rt. Inguinial  . RBBB (right bundle branch block)   . Kidney stones   . Osteoporosis   . Abscess of left hip     infection left hip  . Hypogonadism in male   . Anemia   . Colon polyp    Social History   Social History  . Marital Status: Single    Spouse Name: N/A  . Number of Children: 0  . Years of Education: N/A   Occupational History  . Retired: [employer]    Social History Main Topics  . Smoking status: Never Smoker   . Smokeless tobacco: Never Used  . Alcohol Use: No  . Drug Use: No  . Sexual Activity: Not Asked   Other Topics Concern  . None   Social History Narrative   Patient lives in [place] alone.   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   His closest living relative is his sister who is 90 and lives in nursing home.   No caffeine   Retired [employer]   Family History  Problem Relation Age of Onset  . Colon cancer  Maternal Uncle   . Diabetes Maternal Aunt    Scheduled Meds: . sodium chloride   Intravenous Once  . heparin  5,000 Units Subcutaneous 3 times per day  . lidocaine      . metoprolol tartrate  25 mg Oral BID  . piperacillin-tazobactam (ZOSYN)  IV  3.375 g Intravenous 3 times per day  . potassium chloride  40 mEq Oral Once  . sodium chloride flush  3 mL Intravenous Q12H  . [START ON 06/28/2015] vancomycin  1,000 mg Intravenous Q24H   Continuous Infusions: . 0.9 % sodium chloride with kcl 50 mL/hr at 06/27/15 1116   PRN Meds:.acetaminophen **OR** acetaminophen, HYDROcodone-acetaminophen, ondansetron **OR** ondansetron (ZOFRAN) IV, sodium chloride Medications Prior to Admission:  Prior to Admission medications   Medication Sig Start Date End Date Taking? Authorizing Provider  amoxicillin (AMOXIL) 500 MG tablet Take 500 mg by mouth 2 (two) times daily.     Yes Historical Provider, MD  Cyanocobalamin (VITAMIN B-12) 5000 MCG SUBL Place 1 tablet under  the tongue daily.     Yes Historical Provider, MD  metoprolol (TOPROL-XL) 50 MG 24 hr tablet Take 1.5 tablets (75 mg total) by mouth daily. 04/15/11  Yes Thompson Grayer, MD  sulfamethoxazole-trimethoprim (BACTRIM DS) 800-160 MG per tablet Take 1 tablet by mouth 2 (two) times daily.     Yes Historical Provider, MD  tamsulosin (FLOMAX) 0.4 MG CAPS capsule Take 0.4 mg by mouth.   Yes Historical Provider, MD  dabigatran (PRADAXA) 150 MG CAPS Take 150 mg by mouth every 12 (twelve) hours.      Historical Provider, MD  ergocalciferol (VITAMIN D2) 50000 UNITS capsule Take 50,000 Units by mouth as directed.      Historical Provider, MD  ferrous sulfate (IRON SUPPLEMENT) 325 (65 FE) MG tablet Take 325 mg by mouth daily with breakfast.      Historical Provider, MD  finasteride (PROSCAR) 5 MG tablet Take 5 mg by mouth daily.      Historical Provider, MD  potassium phosphate, monobasic, (K-PHOS ORIGINAL) 500 MG tablet Take 1,250 mg by mouth daily.      Historical  Provider, MD   Allergies  Allergen Reactions  . Primidone     Review of Systems Weakness constipation dysphagia Physical Exam Frail elderly gentleman resting in bed denies any acute complaints Clear to auscultation anteriorly S1-S2 Abdomen soft Extremities edema right lower extremity Awake alert oriented answers all questions appropriately Vital Signs: BP 134/76 mmHg  Pulse 76  Temp(Src) 99.7 F (37.6 C) (Axillary)  Resp 27  Ht 5\' 8"  (1.727 m)  Wt 69.5 kg (153 lb 3.5 oz)  BMI 23.30 kg/m2  SpO2 99%  SpO2: SpO2: 99 % O2 Device:SpO2: 99 % O2 Flow Rate: .O2 Flow Rate (L/min): 2 L/min  IO: Intake/output summary:  Intake/Output Summary (Last 24 hours) at 06/27/15 1613 Last data filed at 06/27/15 1248  Gross per 24 hour  Intake 2498.33 ml  Output   2050 ml  Net 448.33 ml    LBM: Last BM Date: 06/26/15 Baseline Weight: Weight: 69.5 kg (153 lb 3.5 oz) Most recent weight: Weight: 69.5 kg (153 lb 3.5 oz)      Palliative Assessment/Data:  Flowsheet Rows        Most Recent Value   Intake Tab    Referral Department  Hospitalist   Unit at Time of Referral  Med/Surg Unit   Palliative Care Primary Diagnosis  Cardiac   Date Notified  06/25/15   Palliative Care Type  New Palliative care   Reason for referral  Clarify Goals of Care   Date of Admission  06/25/15   Date first seen by Palliative Care  06/27/15   # of days IP prior to Palliative referral  0   Clinical Assessment    Palliative Performance Scale Score  30%   Pain Max last 24 hours  4   Pain Min Last 24 hours  3   Dyspnea Max Last 24 Hours  3   Dyspnea Min Last 24 hours  2   Psychosocial & Spiritual Assessment    Palliative Care Outcomes    Patient/Family meeting held?  Yes   Who was at the meeting?  patient, niece [place] goals of care   Palliative Care follow-up planned  Yes, Facility      Additional Data Reviewed:  CBC:    Component Value Date/Time   WBC 16.3*  06/27/2015 0432   HGB 8.4* 06/27/2015 0432   HCT 24.0* 06/27/2015  0432   PLT 98* 06/27/2015 0432   MCV 92.0 06/27/2015 0432   NEUTROABS 20.3* 06/25/2015 1600   LYMPHSABS 0.8 06/25/2015 1600   MONOABS 1.4* 06/25/2015 1600   EOSABS 0.0 06/25/2015 1600   BASOSABS 0.0 06/25/2015 1600   Comprehensive Metabolic Panel:    Component Value Date/Time   NA 132* 06/27/2015 0802   K 3.3* 06/27/2015 0802   CL 103 06/27/2015 0802   CO2 22 06/27/2015 0802   BUN 12 06/27/2015 0802   CREATININE 1.00 06/27/2015 0802   CREATININE 1.28 12/10/2010 1718   GLUCOSE 95 06/27/2015 0802   CALCIUM 7.5* 06/27/2015 0802   AST 49* 06/27/2015 0802   ALT 25 06/27/2015 0802   ALKPHOS 36* 06/27/2015 0802   BILITOT 1.1 06/27/2015 0802   PROT 4.3* 06/27/2015 0802   ALBUMIN 2.5* 06/27/2015 0802     Time In: 1500 Time Out: 1600 Time Total: 60 min  Greater than 50%  of this time was spent counseling and coordinating care related to the above assessment and plan.  Signed by: Loistine Chance, MD [id number] Loistine Chance, MD  06/27/2015, 4:13 PM  Please contact Palliative Medicine Team phone at [phone] for questions and concerns.

## 2015-06-27 NOTE — Progress Notes (Signed)
Patient's medical condition is improving with decreasing WBCs, resolved RF, improving electrolytes.  Patient's hgb is 8.4 at this time.  I recommend transfusing to obtain Hgb of around 10 as he is expected to lose 1-2 units of blood during surgery.  I am in the process of obtaining operative notes from [facility] for surgical planning.  I will continue to follow along.  I will update the HCPOA.  Surgery will be wed vs thu depending on how he's coming along medically.  Azucena Cecil, MD [place] 8:05 AM

## 2015-06-27 NOTE — Progress Notes (Signed)
[facility] for vancomycin/Zosyn Indication: rule out sepsis  Allergies  Allergen Reactions  . Primidone     Patient Measurements: Height: 5\' 8"  (172.7 cm) Weight: 153 lb 3.5 oz (69.5 kg) IBW/kg (Calculated) : 68.4 **Using old weight of 73.5 kg from 02/14/15 for dosing**  Vital Signs: Temp: 98.4 F (36.9 C) (01/31 1248) Temp Source: Oral (01/31 1248) BP: 140/75 mmHg (01/31 1248) Pulse Rate: 82 (01/31 1248) Intake/Output from previous day: 01/30 0701 - 01/31 0700 In: 3438.3 [I.V.:2218.3; Blood:670; IV Piggyback:550] Out: 1300 [Urine:1300] Intake/Output from this shift: Total I/O In: 335 [Blood:335] Out: 800 [Urine:800]  Labs:  Recent Labs  06/26/15 0648 06/26/15 1837 06/26/15 1838 06/27/15 0432 06/27/15 0802  WBC 24.3*  --  20.6* 16.3*  --   HGB 6.5*  --  8.3* 8.4*  --   PLT 95*  --  95* 98*  --   CREATININE 1.36* 1.19  --  0.99 1.00   Estimated Creatinine Clearance: 48.5 mL/min (by C-G formula based on Cr of 1). No results for input(s): VANCOTROUGH, VANCOPEAK, VANCORANDOM, GENTTROUGH, GENTPEAK, GENTRANDOM, TOBRATROUGH, TOBRAPEAK, TOBRARND, AMIKACINPEAK, AMIKACINTROU, AMIKACIN in the last 72 hours.   Microbiology: Recent Results (from the past 720 hour(s))  Culture, blood (routine x 2)     Status: None (Preliminary result)   Collection Time: 06/25/15  8:25 AM  Result Value Ref Range Status   Specimen Description BLOOD LEFT ANTECUBITAL  Final   Special Requests   Final    BOTTLES DRAWN AEROBIC AND ANAEROBIC 5CC ANA [place] AER   Culture NO GROWTH 2 DAYS  Final   Report Status PENDING  Incomplete  Culture, blood (routine x 2)     Status: None (Preliminary result)   Collection Time: 06/25/15  8:30 AM  Result Value Ref Range Status   Specimen Description BLOOD RIGHT HAND  Final   Special Requests BOTTLES DRAWN AEROBIC ONLY 5CC  Final   Culture NO GROWTH 2 DAYS  Final   Report Status PENDING  Incomplete  Urine culture     Status:  None   Collection Time: 06/25/15  9:11 AM  Result Value Ref Range Status   Specimen Description URINE, CATHETERIZED  Final   Special Requests NONE  Final   Culture NO GROWTH 1 DAY  Final   Report Status 06/26/2015 FINAL  Final  MRSA PCR Screening     Status: None   Collection Time: 06/25/15  3:28 PM  Result Value Ref Range Status   MRSA by PCR NEGATIVE NEGATIVE Final    Comment:        The GeneXpert MRSA Assay (FDA approved for NASAL specimens only), is one component of a comprehensive MRSA colonization surveillance program. It is not intended to diagnose MRSA infection nor to guide or monitor treatment for MRSA infections.     Medical History: Past Medical History  Diagnosis Date  . Permanent atrial fibrillation ([place])   . Osteoarthritis   . Gastritis   . Gastric ulcer   . History of colonic polyps   . Dysphagia     unspecified  . Emphysema   . Diastolic dysfunction   . BPH (benign prostatic hyperplasia)   . Rotator cuff injury   . Hernia     Rt. Inguinial  . RBBB (right bundle branch block)   . Kidney stones   . Osteoporosis   . Abscess of left hip     infection left hip  . Hypogonadism in male   .  Anemia   . Colon polyp     Medications:  Prescriptions prior to admission  Medication Sig Dispense Refill Last Dose  . amoxicillin (AMOXIL) 500 MG tablet Take 500 mg by mouth 2 (two) times daily.     unknown  . Cyanocobalamin (VITAMIN B-12) 5000 MCG SUBL Place 1 tablet under the tongue daily.     unknown  . metoprolol (TOPROL-XL) 50 MG 24 hr tablet Take 1.5 tablets (75 mg total) by mouth daily. 30 tablet 8 unknown  . sulfamethoxazole-trimethoprim (BACTRIM DS) 800-160 MG per tablet Take 1 tablet by mouth 2 (two) times daily.     unknown  . tamsulosin (FLOMAX) 0.4 MG CAPS capsule Take 0.4 mg by mouth.   unknown  . dabigatran (PRADAXA) 150 MG CAPS Take 150 mg by mouth every 12 (twelve) hours.     unknown  . ergocalciferol (VITAMIN D2) 50000 UNITS capsule Take 50,000  Units by mouth as directed.     unknown  . ferrous sulfate (IRON SUPPLEMENT) 325 (65 FE) MG tablet Take 325 mg by mouth daily with breakfast.     unknown  . finasteride (PROSCAR) 5 MG tablet Take 5 mg by mouth daily.     unknown  . potassium phosphate, monobasic, (K-PHOS ORIGINAL) 500 MG tablet Take 1,250 mg by mouth daily.     unknown   Assessment: 82 yo M presents after being found down for unknown period of time. Tachycardic to 160s, dehydrated and confused at presentation, low BP with EMS. Possible chronic right hip replacement infection per family friend. Pharmacy consulted to dose vancomycin/zosyn for sepsis.  SCr continues to trend down, SCr 1 (~est CrCl 61mL/min)  1/29 Vanc >> 1/29 Zosyn >>  1/29 UCx > 1/29 BCx x2 >>  Goal of Therapy:  Vancomycin trough level 15-20 mcg/ml  Plan:  Increasing Vancomycin to 1000 q24h Zosyn 3.375 g q8h Expected duration 5 days with resolution of temperature and/or normalization of WBC Measure antibiotic drug levels at steady state Follow up culture results, LOT, clinical improvement  Thank you for allowing [place] to participate in this patients care. Jens Som, PharmD   06/27/2015,12:54 PM

## 2015-06-27 NOTE — Procedures (Signed)
Interventional Radiology Procedure Note  Procedure:  IVC filter placement  Complications:  None  Estimated Blood Loss: < 10 mL  Findings:  Tortuous, but otherwise normally patient IVC.  [place] IVC filter placed in infrarenal IVC.  Venetia Night. Kathlene Cote, M.D Pager:  [phone]

## 2015-06-27 NOTE — Progress Notes (Addendum)
[facility] PROGRESS NOTE  MARBIN CRAWN [id number] DOB: 1923-12-20 DOA: 06/25/2015 PCP: Jerlyn Ly, MD  Length of stay: 2   Assessment/Plan: Principal Problem:   Severe sepsis ([place]) Active Problems:   Atrial fibrillation ([facility])   Edema of right lower extremity   Acute renal failure (ARF) ([place])   History of right hip replacement   Hyperkalemia   Metabolic acidosis   Fracture   Lactic acidosis    Brief summary 82 year old male with history of atrial fibrillation and right bundle branch block, arthritis status post right hip replacement that had become infected with bacteria sensitive to amoxicillin which he is on chronically for suppression, emphysema, chronic diastolic dysfunction, BPH who was found down by staff at [facility] this morning. He did not answer the phone the night prior so may have been down for more than 12-16 hours. The patient is confused and not able to contribute to history, however he is awake and alert. He does not seem to be able to reliably answer questions such as our you in pain or fever has nausea or vomiting. In the emergency department, his vital signs were notable for tachycardia, tachypnea, however his blood pressures are stable. His labs were notable for metabolic acidosis with a pH of 7.28, lactic acid of 15. The case was initially discussed with critical care however because the patient was DO NOT RESUSCITATE/DO NOT INTUBATE they declined admission. He had mild hyponatremia with AK I with a creatinine of 2.26 but was making urine in the emergency department. His CK was 444 and his initial troponin 0.04. Although had not had a positive anion gap acidosis and a decreased bicarbonate 11, his CBG was orally to 75 and he was not a diabetic. Had a leukocytosis to 24,000 and progressive anemia with a hemoglobin of 9.3. Chest x-ray was unremarkable. Urinalysis negative for signs of acute infection. CT head and neck demonstrated no acute change  and no cervical spine fracture. He received 2 L of IV fluids was started on vancomycin and Zosyn. He also received bicarbonate infusion for his acidosis. Patient anticipated to have surgery on the Wednesday when medically stable,  Assessment and plan Acute blood loss anemia Patient saw Dr. Henrene Pastor on 01/12/15 for guaiac POSITIVE stool Patient has a history of anemia but no hemoglobin value documented on the office note Patient had a virtual colonoscopy to rule out significant mucosal abnormalities of the colon, this was done on 01/27/15 and it was negative, CT abdomen pelvis did not show any acute abnormality, Baseline hemoglobin unknown Hemoglobin drop from  9.3 > 6.5, status post receiving 2 units, hemoglobin 8.4 now, orthopedics wants patient to be transfused for hemoglobin of 10, will transfuse another 2 units Patient should have a CT abdomen pelvis to rule out retroperitoneal bleed  Which was negative , no hematoma    Sepsis-elevated white count, no focal source of infection at this point Continue empiric antibiotics and follow blood culture results given history of chronic left hip infection Lactate improving Continue aggressive fluid boluses   Acute renal failure prerenal vs  Rhabdomyolysis /metabolic  acidosis, likely secondary to lactic acidosis, prerenal. Low suspicious for DKA, no known history of diabetes. Initial arterial pH 7.2. Patient received bicarbonate in ED. -Repeat ABG  shows pH of 7.48 Patient now has a normal anion gap, normal bicarbonate Acute renal failure improving with IV hydration  Hyperkalemia, K+ 5.3, now hypokalemia 3.3, replete.     Atrial fibrillation. ChadsVasc 2-3.  Patient does not have a cardiologist, Heart rate initially in the 150s, now 110's  after fluid resuscitation -hold home Pradaxa for now (not taking PO and may need emergent surgery Continue monitoring and stepdown 2-D echo pending Cardiology consultation Cycle cardiac enzymes,  TSH  Rhabdomyolysis Improving with IV fluids Recheck CK, TSH 2.4   Edema of right lower extremity/right lower extremity DVT. There is mild bruising lateral right thigh. No evidence of  compartment syndrome .Per orthopedics, suspicion for compartment syndrome is low, patient has a right lower extremity DVT, patient would need IR guided IVC filter placement prior to hip surgery    Right hip fracture/Chronic infected left  Hip . Patient on long-term (years) antibiotics  Cardiology recommends to proceed with hip surgery despite high risk involved, as without surgery the patient would not have a good quality, patient has lots of questions for the orthopedic surgeon    DVT prophylaxsis  SCDs   Code Status:      Code Status Orders        Start     Ordered   06/25/15 1548  Do not attempt resuscitation (DNR)   Continuous    Question Answer Comment  In the event of cardiac or respiratory ARREST Do not call a "code blue"   In the event of cardiac or respiratory ARREST Do not perform Intubation, CPR, defibrillation or ACLS   In the event of cardiac or respiratory ARREST Use medication by any route, position, wound care, and other measures to relive pain and suffering. May use oxygen, suction and manual treatment of airway obstruction as needed for comfort.      06/25/15 1548       Family Communication: Discussed in detail with the patient, all imaging results, lab results explained to the patient   Disposition Plan:  transfuse 2 units, proceed with surgery tomorrow after IVC filter placement      Consultants: Orthopedics Cardiology  Procedures None  ics: Anti-infectives    Start     Dose/Rate Route Frequency Ordered Stop   06/27/15 1000  vancomycin (VANCOCIN) IVPB 1000 mg/200 mL premix  Status:  Discontinued     1,000 mg 200 mL/hr over 60 Minutes Intravenous Every 48 hours 06/25/15 1358 06/25/15 1633   06/26/15 0930  vancomycin (VANCOCIN) IVPB 750 mg/150 ml premix     750  mg 150 mL/hr over 60 Minutes Intravenous Every 24 hours 06/25/15 1633     06/25/15 1730  piperacillin-tazobactam (ZOSYN) IVPB 3.375 g     3.375 g 12.5 mL/hr over 240 Minutes Intravenous 3 times per day 06/25/15 1633     06/25/15 1700  piperacillin-tazobactam (ZOSYN) IVPB 2.25 g  Status:  Discontinued     2.25 g 100 mL/hr over 30 Minutes Intravenous Every 8 hours 06/25/15 1358 06/25/15 1633   06/25/15 0930  vancomycin (VANCOCIN) 1,500 mg in sodium chloride 0.9 % 500 mL IVPB     1,500 mg 250 mL/hr over 120 Minutes Intravenous  Once 06/25/15 0849 06/25/15 1249   06/25/15 0900  piperacillin-tazobactam (ZOSYN) IVPB 3.375 g     3.375 g 100 mL/hr over 30 Minutes Intravenous  Once 06/25/15 0849 06/25/15 0930         HPI/Subject More stable today, atrial fibrillation is rate controlled  Denies any chest pain or shortness of breath, denies any significant illnesses in the days preceding to this hospitalization   Objective: Filed Vitals:   06/27/15 0700 06/27/15 0800 06/27/15 1025 06/27/15 1030  BP:  135/64 140/66  130/75  Pulse:  81 85 87  Temp: 98.4 F (36.9 C)   99.1 F (37.3 C)  TempSrc: Oral   Oral  Resp:  19 23 25   Height:      Weight:      SpO2:  100% 98% 98%    Intake/Output Summary (Last 24 hours) at 06/27/15 1051 Last data filed at 06/27/15 0800  Gross per 24 hour  Intake 3288.33 ml  Output   1850 ml  Net 1438.33 ml    Exam: frail-appearing male, awake and alert, no acute distress, blood crusted on his face with a large sharp on the right frontal scalp.  HEENT: Dry mucous membranes.  CV: Irregular rate and rhythm, 2/6 systolic murmur Pulm: Clear to auscultation bilaterally, no respiratory distress Abdomen: NABS, soft, nondistended, nontender MSK: Upper extremities normal tone and bulk, right lower extremity concerning for a tense thigh with some mild pinkness on the right hip over the greater trochanter but not brightly pink or erythematous. The patient  does not cringe with palpation. Left lower extremity without edema, normal bulk.  Data Review   Micro Results Recent Results (from the past 240 hour(s))  Culture, blood (routine x 2)     Status: None (Preliminary result)   Collection Time: 06/25/15  8:25 AM  Result Value Ref Range Status   Specimen Description BLOOD LEFT ANTECUBITAL  Final   Special Requests   Final    BOTTLES DRAWN AEROBIC AND ANAEROBIC 5CC ANA [place] [facility]   Culture NO GROWTH 1 DAY  Final   Report Status PENDING  Incomplete  Culture, blood (routine x 2)     Status: None (Preliminary result)   Collection Time: 06/25/15  8:30 AM  Result Value Ref Range Status   Specimen Description BLOOD RIGHT HAND  Final   Special Requests BOTTLES DRAWN AEROBIC ONLY 5CC  Final   Culture NO GROWTH 1 DAY  Final   Report Status PENDING  Incomplete  Urine culture     Status: None   Collection Time: 06/25/15  9:11 AM  Result Value Ref Range Status   Specimen Description URINE, CATHETERIZED  Final   Special Requests NONE  Final   Culture NO GROWTH 1 DAY  Final   Report Status 06/26/2015 FINAL  Final  MRSA PCR Screening     Status: None   Collection Time: 06/25/15  3:28 PM  Result Value Ref Range Status   MRSA by PCR NEGATIVE NEGATIVE Final    Comment:        The GeneXpert MRSA Assay (FDA approved for NASAL specimens only), is one component of a comprehensive MRSA colonization surveillance program. It is not intended to diagnose MRSA infection nor to guide or monitor treatment for MRSA infections.     Radiology Reports Ct Abdomen Pelvis Wo Contrast  06/26/2015  CLINICAL DATA:  Acute anemia. Evaluate for retroperitoneal bleed. Severe sepsis with acute renal failure. EXAM: CT ABDOMEN AND PELVIS WITHOUT CONTRAST TECHNIQUE: Multidetector CT imaging of the abdomen and pelvis was performed following the standard protocol without IV contrast. COMPARISON:  CT virtual colonoscopy 01/27/2015. Abdominal pelvic CT 11/01/2011. FINDINGS: Lower  chest: There are new small dependent low-density pleural effusions bilaterally with associated patchy atelectasis at both lung bases. The heart remains enlarged. Hepatobiliary: As evaluated in the noncontrast state, the liver appears unremarkable. The gallbladder appears unremarkable. Previously noted biliary dilatation appears improved. Pancreas: Appears atrophied. No evidence of focal abnormality or surrounding inflammation. Spleen: Stable appearance with multiple small granulomas. Adrenals/Urinary  Tract: Stable without suspicious findings. There is stable subcapsular calcification posteriorly in the mid left kidney and a stable interpolar cyst in the right kidney, measuring up to 6.2 cm. No evidence of hydronephrosis. Stable tiny calculi in the lower pole of the left kidney. The bladder is partly obscured by artifact from the bilateral total hip arthroplasties, although demonstrates no abnormality. Stomach/Bowel: No evidence of bowel wall thickening, distention or surrounding inflammatory change. Vascular/Lymphatic: There are no enlarged abdominal or pelvic lymph nodes. There is diffuse atherosclerosis of the aorta, its branches and the iliac arteries. No evidence of retroperitoneal hematoma. Reproductive: Partly obscured by artifact from total hip arthroplasties. Grossly stable. Other: Mild subcutaneous edema within the abdominal wall bilaterally. Apparent asymmetric enlargement of the proximal right thigh without demonstrated focal hematoma. No evidence of hemoperitoneum. Musculoskeletal: No acute or significant osseous findings. Chronic appearing superior endplate compression deformities are noted at T10, T12 and L2. Previous bilateral total hip arthroplasty. IMPRESSION: 1. No evidence of retroperitoneal hematoma. There is mild asymmetric enlargement of the proximal right thigh without apparent hematoma. Correlate clinically. 2. Generalized soft tissue edema with new bilateral pleural effusions suspicious for  anasarca. 3. Stable subcapsular calcification posteriorly in the left kidney and right renal cyst. No evidence of hydronephrosis or ureteral calculus. Electronically Signed   By: Richardean Sale M.D.   On: 06/26/2015 21:03   Ct Head Wo Contrast  06/25/2015  CLINICAL DATA:  Found on floor, unwitnessed fall. Head laceration. Altered mental status, confusion EXAM: CT HEAD WITHOUT CONTRAST CT CERVICAL SPINE WITHOUT CONTRAST TECHNIQUE: Multidetector CT imaging of the head and cervical spine was performed following the standard protocol without intravenous contrast. Multiplanar CT image reconstructions of the cervical spine were also generated. COMPARISON:  None. FINDINGS: CT HEAD FINDINGS There is atrophy and chronic small vessel disease changes. No acute intracranial abnormality. Specifically, no hemorrhage, hydrocephalus, mass lesion, acute infarction, or significant intracranial injury. No acute calvarial abnormality. Visualized paranasal sinuses and mastoids clear. Orbital soft tissues unremarkable. CT CERVICAL SPINE FINDINGS Diffuse degenerative facet disease throughout the cervical spine. Degenerative disc disease in the lower cervical spine. Prevertebral soft tissues are normal. Alignment is normal. No fracture. No epidural or paraspinal hematoma. IMPRESSION: No acute intracranial abnormality. Atrophy, chronic microvascular disease. Degenerative changes in the cervical spine. No acute bony abnormality. Electronically Signed   By: Rolm Baptise M.D.   On: 06/25/2015 09:58   Ct Cervical Spine Wo Contrast  06/25/2015  CLINICAL DATA:  Found on floor, unwitnessed fall. Head laceration. Altered mental status, confusion EXAM: CT HEAD WITHOUT CONTRAST CT CERVICAL SPINE WITHOUT CONTRAST TECHNIQUE: Multidetector CT imaging of the head and cervical spine was performed following the standard protocol without intravenous contrast. Multiplanar CT image reconstructions of the cervical spine were also generated. COMPARISON:   None. FINDINGS: CT HEAD FINDINGS There is atrophy and chronic small vessel disease changes. No acute intracranial abnormality. Specifically, no hemorrhage, hydrocephalus, mass lesion, acute infarction, or significant intracranial injury. No acute calvarial abnormality. Visualized paranasal sinuses and mastoids clear. Orbital soft tissues unremarkable. CT CERVICAL SPINE FINDINGS Diffuse degenerative facet disease throughout the cervical spine. Degenerative disc disease in the lower cervical spine. Prevertebral soft tissues are normal. Alignment is normal. No fracture. No epidural or paraspinal hematoma. IMPRESSION: No acute intracranial abnormality. Atrophy, chronic microvascular disease. Degenerative changes in the cervical spine. No acute bony abnormality. Electronically Signed   By: Rolm Baptise M.D.   On: 06/25/2015 09:58   Ct Femur Right Wo Contrast  06/25/2015  CLINICAL  DATA:  Leg pain. Pacemaker precludes MRI. Clinical concern for compartment syndrome. EXAM: CT OF THE RIGHT FEMUR WITHOUT CONTRAST; CT OF THE RIGHT TIBIA FIBULA WITHOUT CONTRAST TECHNIQUE: Multidetector CT imaging was performed according to the standard protocol. Multiplanar CT image reconstructions were also generated. COMPARISON:  None. FINDINGS: CT right femur: Right total hip prosthesis observed with a periprosthetic fracture tracking from the greater trochanter longitudinal along the stem. There is an oblique component which tracks medially and is displaced 1.4 cm posteromedial from the rest of the shaft. The tip of this oblique proximal medial fragment is about at the level of the tip of the implant. However, the fracture appears to extend slightly further in a longitudinal fashion posteromedially as shown on images 87 through 112 of series 1. Below this level the fracture plane is no longer well shown by CT. Bony demineralization is present. Common is requested for compartment syndrome. Assessment of the upper thigh is especially  problematic due to the extensive streak artifact from the implant which can obscure the subtle intramuscular effacement of adipose tissue and other signs of compartment syndrome. I see no specific evidence of compartment syndrome but compartmental pressures would be a much more reliable check. Further distally and below the level of the implant, the visualized muscle compartments appear unusually hypertrophic/ well-developed for age, but still with marbling of adipose tissue in the muscles which would argue against a compartment syndrome. There is a small knee effusion along with some prepatellar edema. Diffuse atherosclerotic calcification is observed. CT right lower leg: No visualized tubular fibular fracture is observed; the distal most tip of the fibula is excluded. Diffuse atherosclerosis. There is subcutaneous edema tracking in the calf diffusely. Fatty marbling in the muscle tissues is present, slightly less notable in the tibialis anterior, but the other anterior compartmental muscles appear unremarkable and accordingly I am skeptical of compartment syndrome in the calf. IMPRESSION: 1. Periprosthetic fracture along the right hip implant, extending from the greater trochanter along the stem of the implant. This has a displaced component, with the medial fracture fragment about 1.4 cm posteromedial to the rest of the shaft at its distal tip. This distal tip is at about the level of the stem of the implant. There is also a longitudinal fracture component extending distally from the level of the tip of the implant within the shaft, which becomes indistinct several sent centimeters distally. 2. There is subcutaneous edema in the thigh and calf, but reasonably normal marbling of adipose tissue in the muscles arguing against compartment syndrome. Clearly muscle compartment pressures are more sensitive and specific measure, and particularly in the upper thigh in the vicinity of the implant the streak artifact from  the metal further reduces diagnostic sensitivity. If there is continued clinical concern for compartment syndrome than compartment manometry would be recommended. 3. Small knee effusion. 4. No gas tracking in the soft tissues, or specific findings of high concern for abscess. Electronically Signed   By: Van Clines M.D.   On: 06/25/2015 14:29   Ct Tibia Fibula Right Wo Contrast  06/25/2015  CLINICAL DATA:  Leg pain. Pacemaker precludes MRI. Clinical concern for compartment syndrome. EXAM: CT OF THE RIGHT FEMUR WITHOUT CONTRAST; CT OF THE RIGHT TIBIA FIBULA WITHOUT CONTRAST TECHNIQUE: Multidetector CT imaging was performed according to the standard protocol. Multiplanar CT image reconstructions were also generated. COMPARISON:  None. FINDINGS: CT right femur: Right total hip prosthesis observed with a periprosthetic fracture tracking from the greater trochanter longitudinal along the stem.  There is an oblique component which tracks medially and is displaced 1.4 cm posteromedial from the rest of the shaft. The tip of this oblique proximal medial fragment is about at the level of the tip of the implant. However, the fracture appears to extend slightly further in a longitudinal fashion posteromedially as shown on images 87 through 112 of series 1. Below this level the fracture plane is no longer well shown by CT. Bony demineralization is present. Common is requested for compartment syndrome. Assessment of the upper thigh is especially problematic due to the extensive streak artifact from the implant which can obscure the subtle intramuscular effacement of adipose tissue and other signs of compartment syndrome. I see no specific evidence of compartment syndrome but compartmental pressures would be a much more reliable check. Further distally and below the level of the implant, the visualized muscle compartments appear unusually hypertrophic/ well-developed for age, but still with marbling of adipose tissue in  the muscles which would argue against a compartment syndrome. There is a small knee effusion along with some prepatellar edema. Diffuse atherosclerotic calcification is observed. CT right lower leg: No visualized tubular fibular fracture is observed; the distal most tip of the fibula is excluded. Diffuse atherosclerosis. There is subcutaneous edema tracking in the calf diffusely. Fatty marbling in the muscle tissues is present, slightly less notable in the tibialis anterior, but the other anterior compartmental muscles appear unremarkable and accordingly I am skeptical of compartment syndrome in the calf. IMPRESSION: 1. Periprosthetic fracture along the right hip implant, extending from the greater trochanter along the stem of the implant. This has a displaced component, with the medial fracture fragment about 1.4 cm posteromedial to the rest of the shaft at its distal tip. This distal tip is at about the level of the stem of the implant. There is also a longitudinal fracture component extending distally from the level of the tip of the implant within the shaft, which becomes indistinct several sent centimeters distally. 2. There is subcutaneous edema in the thigh and calf, but reasonably normal marbling of adipose tissue in the muscles arguing against compartment syndrome. Clearly muscle compartment pressures are more sensitive and specific measure, and particularly in the upper thigh in the vicinity of the implant the streak artifact from the metal further reduces diagnostic sensitivity. If there is continued clinical concern for compartment syndrome than compartment manometry would be recommended. 3. Small knee effusion. 4. No gas tracking in the soft tissues, or specific findings of high concern for abscess. Electronically Signed   By: Van Clines M.D.   On: 06/25/2015 14:29   Dg Chest Port 1 View  06/26/2015  CLINICAL DATA:  Sepsis and edema. EXAM: PORTABLE CHEST - 1 VIEW COMPARISON:  One-view chest  06/25/2015. FINDINGS: The heart is enlarged. Pulmonary arteries are enlarged. Atherosclerotic changes are again noted at the aorta. Minimal bibasilar airspace disease likely reflects atelectasis. There is no edema or effusion to suggest failure. Emphysematous changes are suggested. The visualized soft tissues and bony thorax are unremarkable. IMPRESSION: 1. Cardiomegaly without failure. 2. Atherosclerosis. 3. Minimal bibasilar airspace disease likely reflects atelectasis. Electronically Signed   By: San Morelle M.D.   On: 06/26/2015 07:34   Dg Chest Portable 1 View  06/25/2015  CLINICAL DATA:  Found unresponsive, shortness of breath EXAM: PORTABLE CHEST 1 VIEW COMPARISON:  06/22/2007 FINDINGS: Cardiac shadow is stable. The lungs are well aerated bilaterally. No pneumothorax or focal infiltrate is seen. No acute fracture is noted. IMPRESSION: No acute  abnormality seen. Electronically Signed   By: Inez Catalina M.D.   On: 06/25/2015 08:40   Dg Hip Unilat With Pelvis 2-3 Views Right  06/25/2015  CLINICAL DATA:  Right hip pain after fall. Periprosthetic fracture on CT. EXAM: DG HIP (WITH OR WITHOUT PELVIS) 2-3V RIGHT COMPARISON:  CT femur earlier this day. FINDINGS: Right hip arthroplasty, with known periprosthetic fracture about the greater trochanter and femoral stem. This is better characterized on recently performed CT. The displaced distal component is visualized. Femoral head component remains seated in the acetabular cup. Vascular calcifications are seen. IMPRESSION: Known periprosthetic fracture about the femoral stem of the right hip arthroplasty, better characterized on recent CT. Electronically Signed   By: Jeb Levering M.D.   On: 06/25/2015 21:07     CBC  Recent Labs Lab 06/25/15 0832  06/25/15 1600 06/25/15 1830 06/26/15 0328 06/26/15 0648 06/26/15 1838 06/27/15 0432  WBC 23.8*  < > 22.5* 24.4* 25.7* 24.3* 20.6* 16.3*  HGB 9.3*  < > 8.1* 7.9* 6.8* 6.5* 8.3* 8.4*  HCT 27.1*   < > 22.6* 22.0* 19.0* 17.8* 23.4* 24.0*  PLT 141*  < > 123* 121* 104* 95* 95* 98*  MCV 98.9  < > 95.4 94.8 95.0 94.7 91.8 92.0  MCH 33.9  < > 34.2* 34.1* 34.0 34.6* 32.5 32.2  MCHC 34.3  < > 35.8 35.9 35.8 36.5* 35.5 35.0  RDW 13.5  < > 13.2 13.4 13.7 13.6 15.9* 16.5*  LYMPHSABS 1.0  --  0.8  --   --   --   --   --   MONOABS 0.8  --  1.4*  --   --   --   --   --   EOSABS 0.0  --  0.0  --   --   --   --   --   BASOSABS 0.0  --  0.0  --   --   --   --   --   < > = values in this interval not displayed.  Chemistries   Recent Labs Lab 06/26/15 0328 06/26/15 0648 06/26/15 1837 06/27/15 0432 06/27/15 0802  NA 132* 130* 136 134* 132*  K 4.2 3.9 3.8 3.3* 3.3*  CL 101 102 103 104 103  CO2 23 23 24 22 22   GLUCOSE 121* 123* 116* 94 95  BUN 16 15 15 12 12   CREATININE 1.42* 1.36* 1.19 0.99 1.00  CALCIUM 7.6* 7.5* 7.7* 7.7* 7.5*  AST 47* 47* 52* 51* 49*  ALT 20 21 22 25 25   ALKPHOS 32* 29* 31* 35* 36*  BILITOT 0.4 0.5 1.7* 1.2 1.1   ------------------------------------------------------------------------------------------------------------------ estimated creatinine clearance is 48.5 mL/min (by C-G formula based on Cr of 1). ------------------------------------------------------------------------------------------------------------------ No results for input(s): HGBA1C in the last 72 hours. ------------------------------------------------------------------------------------------------------------------ No results for input(s): CHOL, HDL, LDLCALC, TRIG, CHOLHDL, LDLDIRECT in the last 72 hours. ------------------------------------------------------------------------------------------------------------------  Recent Labs  06/26/15 1837  TSH 2.430   ------------------------------------------------------------------------------------------------------------------ No results for input(s): VITAMINB12, FOLATE, FERRITIN, TIBC, IRON, RETICCTPCT in the last 72 hours.  Coagulation  profile  Recent Labs Lab 06/25/15 1600  INR 1.97*    No results for input(s): DDIMER in the last 72 hours.  Cardiac Enzymes  Recent Labs Lab 06/25/15 0832 06/26/15 1837 06/26/15 2220  TROPONINI 0.04* 0.07* 0.09*   ------------------------------------------------------------------------------------------------------------------ Invalid input(s): POCBNP   CBG:  Recent Labs Lab 06/25/15 0850  GLUCAP 236*       Studies: Ct Abdomen Pelvis Wo Contrast  06/26/2015  CLINICAL DATA:  Acute anemia. Evaluate for retroperitoneal bleed. Severe sepsis with acute renal failure. EXAM: CT ABDOMEN AND PELVIS WITHOUT CONTRAST TECHNIQUE: Multidetector CT imaging of the abdomen and pelvis was performed following the standard protocol without IV contrast. COMPARISON:  CT virtual colonoscopy 01/27/2015. Abdominal pelvic CT 11/01/2011. FINDINGS: Lower chest: There are new small dependent low-density pleural effusions bilaterally with associated patchy atelectasis at both lung bases. The heart remains enlarged. Hepatobiliary: As evaluated in the noncontrast state, the liver appears unremarkable. The gallbladder appears unremarkable. Previously noted biliary dilatation appears improved. Pancreas: Appears atrophied. No evidence of focal abnormality or surrounding inflammation. Spleen: Stable appearance with multiple small granulomas. Adrenals/Urinary Tract: Stable without suspicious findings. There is stable subcapsular calcification posteriorly in the mid left kidney and a stable interpolar cyst in the right kidney, measuring up to 6.2 cm. No evidence of hydronephrosis. Stable tiny calculi in the lower pole of the left kidney. The bladder is partly obscured by artifact from the bilateral total hip arthroplasties, although demonstrates no abnormality. Stomach/Bowel: No evidence of bowel wall thickening, distention or surrounding inflammatory change. Vascular/Lymphatic: There are no enlarged abdominal or  pelvic lymph nodes. There is diffuse atherosclerosis of the aorta, its branches and the iliac arteries. No evidence of retroperitoneal hematoma. Reproductive: Partly obscured by artifact from total hip arthroplasties. Grossly stable. Other: Mild subcutaneous edema within the abdominal wall bilaterally. Apparent asymmetric enlargement of the proximal right thigh without demonstrated focal hematoma. No evidence of hemoperitoneum. Musculoskeletal: No acute or significant osseous findings. Chronic appearing superior endplate compression deformities are noted at T10, T12 and L2. Previous bilateral total hip arthroplasty. IMPRESSION: 1. No evidence of retroperitoneal hematoma. There is mild asymmetric enlargement of the proximal right thigh without apparent hematoma. Correlate clinically. 2. Generalized soft tissue edema with new bilateral pleural effusions suspicious for anasarca. 3. Stable subcapsular calcification posteriorly in the left kidney and right renal cyst. No evidence of hydronephrosis or ureteral calculus. Electronically Signed   By: Richardean Sale M.D.   On: 06/26/2015 21:03   Ct Femur Right Wo Contrast  06/25/2015  CLINICAL DATA:  Leg pain. Pacemaker precludes MRI. Clinical concern for compartment syndrome. EXAM: CT OF THE RIGHT FEMUR WITHOUT CONTRAST; CT OF THE RIGHT TIBIA FIBULA WITHOUT CONTRAST TECHNIQUE: Multidetector CT imaging was performed according to the standard protocol. Multiplanar CT image reconstructions were also generated. COMPARISON:  None. FINDINGS: CT right femur: Right total hip prosthesis observed with a periprosthetic fracture tracking from the greater trochanter longitudinal along the stem. There is an oblique component which tracks medially and is displaced 1.4 cm posteromedial from the rest of the shaft. The tip of this oblique proximal medial fragment is about at the level of the tip of the implant. However, the fracture appears to extend slightly further in a longitudinal  fashion posteromedially as shown on images 87 through 112 of series 1. Below this level the fracture plane is no longer well shown by CT. Bony demineralization is present. Common is requested for compartment syndrome. Assessment of the upper thigh is especially problematic due to the extensive streak artifact from the implant which can obscure the subtle intramuscular effacement of adipose tissue and other signs of compartment syndrome. I see no specific evidence of compartment syndrome but compartmental pressures would be a much more reliable check. Further distally and below the level of the implant, the visualized muscle compartments appear unusually hypertrophic/ well-developed for age, but still with marbling of adipose tissue in the muscles which would argue against a  compartment syndrome. There is a small knee effusion along with some prepatellar edema. Diffuse atherosclerotic calcification is observed. CT right lower leg: No visualized tubular fibular fracture is observed; the distal most tip of the fibula is excluded. Diffuse atherosclerosis. There is subcutaneous edema tracking in the calf diffusely. Fatty marbling in the muscle tissues is present, slightly less notable in the tibialis anterior, but the other anterior compartmental muscles appear unremarkable and accordingly I am skeptical of compartment syndrome in the calf. IMPRESSION: 1. Periprosthetic fracture along the right hip implant, extending from the greater trochanter along the stem of the implant. This has a displaced component, with the medial fracture fragment about 1.4 cm posteromedial to the rest of the shaft at its distal tip. This distal tip is at about the level of the stem of the implant. There is also a longitudinal fracture component extending distally from the level of the tip of the implant within the shaft, which becomes indistinct several sent centimeters distally. 2. There is subcutaneous edema in the thigh and calf, but  reasonably normal marbling of adipose tissue in the muscles arguing against compartment syndrome. Clearly muscle compartment pressures are more sensitive and specific measure, and particularly in the upper thigh in the vicinity of the implant the streak artifact from the metal further reduces diagnostic sensitivity. If there is continued clinical concern for compartment syndrome than compartment manometry would be recommended. 3. Small knee effusion. 4. No gas tracking in the soft tissues, or specific findings of high concern for abscess. Electronically Signed   By: Van Clines M.D.   On: 06/25/2015 14:29   Ct Tibia Fibula Right Wo Contrast  06/25/2015  CLINICAL DATA:  Leg pain. Pacemaker precludes MRI. Clinical concern for compartment syndrome. EXAM: CT OF THE RIGHT FEMUR WITHOUT CONTRAST; CT OF THE RIGHT TIBIA FIBULA WITHOUT CONTRAST TECHNIQUE: Multidetector CT imaging was performed according to the standard protocol. Multiplanar CT image reconstructions were also generated. COMPARISON:  None. FINDINGS: CT right femur: Right total hip prosthesis observed with a periprosthetic fracture tracking from the greater trochanter longitudinal along the stem. There is an oblique component which tracks medially and is displaced 1.4 cm posteromedial from the rest of the shaft. The tip of this oblique proximal medial fragment is about at the level of the tip of the implant. However, the fracture appears to extend slightly further in a longitudinal fashion posteromedially as shown on images 87 through 112 of series 1. Below this level the fracture plane is no longer well shown by CT. Bony demineralization is present. Common is requested for compartment syndrome. Assessment of the upper thigh is especially problematic due to the extensive streak artifact from the implant which can obscure the subtle intramuscular effacement of adipose tissue and other signs of compartment syndrome. I see no specific evidence of  compartment syndrome but compartmental pressures would be a much more reliable check. Further distally and below the level of the implant, the visualized muscle compartments appear unusually hypertrophic/ well-developed for age, but still with marbling of adipose tissue in the muscles which would argue against a compartment syndrome. There is a small knee effusion along with some prepatellar edema. Diffuse atherosclerotic calcification is observed. CT right lower leg: No visualized tubular fibular fracture is observed; the distal most tip of the fibula is excluded. Diffuse atherosclerosis. There is subcutaneous edema tracking in the calf diffusely. Fatty marbling in the muscle tissues is present, slightly less notable in the tibialis anterior, but the other anterior compartmental muscles appear  unremarkable and accordingly I am skeptical of compartment syndrome in the calf. IMPRESSION: 1. Periprosthetic fracture along the right hip implant, extending from the greater trochanter along the stem of the implant. This has a displaced component, with the medial fracture fragment about 1.4 cm posteromedial to the rest of the shaft at its distal tip. This distal tip is at about the level of the stem of the implant. There is also a longitudinal fracture component extending distally from the level of the tip of the implant within the shaft, which becomes indistinct several sent centimeters distally. 2. There is subcutaneous edema in the thigh and calf, but reasonably normal marbling of adipose tissue in the muscles arguing against compartment syndrome. Clearly muscle compartment pressures are more sensitive and specific measure, and particularly in the upper thigh in the vicinity of the implant the streak artifact from the metal further reduces diagnostic sensitivity. If there is continued clinical concern for compartment syndrome than compartment manometry would be recommended. 3. Small knee effusion. 4. No gas tracking in  the soft tissues, or specific findings of high concern for abscess. Electronically Signed   By: Van Clines M.D.   On: 06/25/2015 14:29   Dg Chest Port 1 View  06/26/2015  CLINICAL DATA:  Sepsis and edema. EXAM: PORTABLE CHEST - 1 VIEW COMPARISON:  One-view chest 06/25/2015. FINDINGS: The heart is enlarged. Pulmonary arteries are enlarged. Atherosclerotic changes are again noted at the aorta. Minimal bibasilar airspace disease likely reflects atelectasis. There is no edema or effusion to suggest failure. Emphysematous changes are suggested. The visualized soft tissues and bony thorax are unremarkable. IMPRESSION: 1. Cardiomegaly without failure. 2. Atherosclerosis. 3. Minimal bibasilar airspace disease likely reflects atelectasis. Electronically Signed   By: San Morelle M.D.   On: 06/26/2015 07:34   Dg Hip Unilat With Pelvis 2-3 Views Right  06/25/2015  CLINICAL DATA:  Right hip pain after fall. Periprosthetic fracture on CT. EXAM: DG HIP (WITH OR WITHOUT PELVIS) 2-3V RIGHT COMPARISON:  CT femur earlier this day. FINDINGS: Right hip arthroplasty, with known periprosthetic fracture about the greater trochanter and femoral stem. This is better characterized on recently performed CT. The displaced distal component is visualized. Femoral head component remains seated in the acetabular cup. Vascular calcifications are seen. IMPRESSION: Known periprosthetic fracture about the femoral stem of the right hip arthroplasty, better characterized on recent CT. Electronically Signed   By: Jeb Levering M.D.   On: 06/25/2015 21:07      No results found for: HGBA1C Lab Results  Component Value Date   CREATININE 1.00 06/27/2015       Scheduled Meds: . sodium chloride   Intravenous Once  . heparin  5,000 Units Subcutaneous 3 times per day  . piperacillin-tazobactam (ZOSYN)  IV  3.375 g Intravenous 3 times per day  . potassium chloride  40 mEq Oral Once  . sodium chloride flush  3 mL  Intravenous Q12H  . vancomycin  750 mg Intravenous Q24H   Continuous Infusions: . 0.9 % sodium chloride with kcl      Principal Problem:   Severe sepsis (HCC) Active Problems:   Atrial fibrillation (HCC)   Edema of right lower extremity   Acute renal failure (ARF) (HCC)   History of right hip replacement   Hyperkalemia   Metabolic acidosis   Fracture   Lactic acidosis    Time spent: 45 minutes   [facility] Pager [phone]. If 7PM-7AM, please contact night-coverage at [email or web address], password [facility] 06/27/2015,  10:51 AM  LOS: 2 days

## 2015-06-28 ENCOUNTER — Inpatient Hospital Stay (HOSPITAL_COMMUNITY): Payer: Medicare Other

## 2015-06-28 DIAGNOSIS — Z96649 Presence of unspecified artificial hip joint: Secondary | ICD-10-CM

## 2015-06-28 DIAGNOSIS — I82401 Acute embolism and thrombosis of unspecified deep veins of right lower extremity: Secondary | ICD-10-CM

## 2015-06-28 DIAGNOSIS — Z01810 Encounter for preprocedural cardiovascular examination: Secondary | ICD-10-CM

## 2015-06-28 DIAGNOSIS — I82409 Acute embolism and thrombosis of unspecified deep veins of unspecified lower extremity: Secondary | ICD-10-CM | POA: Diagnosis not present

## 2015-06-28 DIAGNOSIS — N289 Disorder of kidney and ureter, unspecified: Secondary | ICD-10-CM

## 2015-06-28 DIAGNOSIS — Z7901 Long term (current) use of anticoagulants: Secondary | ICD-10-CM

## 2015-06-28 DIAGNOSIS — I4510 Unspecified right bundle-branch block: Secondary | ICD-10-CM | POA: Diagnosis present

## 2015-06-28 DIAGNOSIS — Z95828 Presence of other vascular implants and grafts: Secondary | ICD-10-CM

## 2015-06-28 LAB — COMPREHENSIVE METABOLIC PANEL
ALT: 26 U/L (ref 17–63)
AST: 45 U/L — AB (ref 15–41)
Albumin: 2.7 g/dL — ABNORMAL LOW (ref 3.5–5.0)
Alkaline Phosphatase: 47 U/L (ref 38–126)
Anion gap: 13 (ref 5–15)
BUN: 12 mg/dL (ref 6–20)
CHLORIDE: 101 mmol/L (ref 101–111)
CO2: 19 mmol/L — AB (ref 22–32)
Calcium: 8 mg/dL — ABNORMAL LOW (ref 8.9–10.3)
Creatinine, Ser: 0.98 mg/dL (ref 0.61–1.24)
Glucose, Bld: 89 mg/dL (ref 65–99)
POTASSIUM: 4.2 mmol/L (ref 3.5–5.1)
SODIUM: 133 mmol/L — AB (ref 135–145)
Total Bilirubin: 1.7 mg/dL — ABNORMAL HIGH (ref 0.3–1.2)
Total Protein: 4.8 g/dL — ABNORMAL LOW (ref 6.5–8.1)

## 2015-06-28 LAB — TYPE AND SCREEN
ABO/RH(D): A POS
Antibody Screen: NEGATIVE
UNIT DIVISION: 0
UNIT DIVISION: 0
Unit division: 0
Unit division: 0

## 2015-06-28 LAB — CBC
HCT: 30.9 % — ABNORMAL LOW (ref 39.0–52.0)
Hemoglobin: 11.2 g/dL — ABNORMAL LOW (ref 13.0–17.0)
MCH: 32.9 pg (ref 26.0–34.0)
MCHC: 36.2 g/dL — ABNORMAL HIGH (ref 30.0–36.0)
MCV: 90.9 fL (ref 78.0–100.0)
PLATELETS: 82 10*3/uL — AB (ref 150–400)
RBC: 3.4 MIL/uL — AB (ref 4.22–5.81)
RDW: 16.6 % — AB (ref 11.5–15.5)
WBC: 10.7 10*3/uL — AB (ref 4.0–10.5)

## 2015-06-28 LAB — CK: Total CK: 435 U/L — ABNORMAL HIGH (ref 49–397)

## 2015-06-28 LAB — URINE CULTURE: Culture: NO GROWTH

## 2015-06-28 MED ORDER — ALBUTEROL SULFATE (2.5 MG/3ML) 0.083% IN NEBU: 2.5000 mg | INHALATION_SOLUTION | RESPIRATORY_TRACT | Status: DC | PRN

## 2015-06-28 MED ORDER — CEFAZOLIN SODIUM-DEXTROSE 2-3 GM-% IV SOLR
2.0000 g | INTRAVENOUS | Status: AC
  Administered 2015-06-29: 2 g via INTRAVENOUS
  Filled 2015-06-28: qty 50

## 2015-06-28 NOTE — Progress Notes (Signed)
Subjective:  No complaints of chest pain or SOB  Objective:  Vital Signs in the last 24 hours: Temp:  [97.7 F (36.5 C)-99.7 F (37.6 C)] 97.9 F (36.6 C) (02/01 0318) Pulse Rate:  [76-98] 91 (02/01 0815) Resp:  [20-30] 25 (02/01 0815) BP: (120-156)/(66-98) 144/82 mmHg (02/01 0815) SpO2:  [98 %-100 %] 98 % (02/01 0815)  Intake/Output from previous day:  Intake/Output Summary (Last 24 hours) at 06/28/15 0852 Last data filed at 06/28/15 0815  Gross per 24 hour  Intake 2106.67 ml  Output   2176 ml  Net -69.33 ml    Physical Exam: General appearance: alert, cooperative, cachectic and no distress Neck: no JVD Lungs: clear to auscultation bilaterally Heart: irregularly irregular rhythm Neurologic: Grossly normal   Rate: 88  Rhythm: atrial fibrillation  Lab Results:  Recent Labs  06/27/15 0432 06/28/15 0630  WBC 16.3* 10.7*  HGB 8.4* 11.2*  PLT 98* 82*    Recent Labs  06/27/15 0802 06/28/15 0630  NA 132* 133*  K 3.3* 4.2  CL 103 101  CO2 22 19*  GLUCOSE 95 89  BUN 12 12  CREATININE 1.00 0.98    Recent Labs  06/26/15 1837 06/26/15 2220  TROPONINI 0.07* 0.09*    Recent Labs  06/25/15 1600  INR 1.97*    Scheduled Meds: . sodium chloride   Intravenous Once  . [START ON 06/29/2015]  ceFAZolin (ANCEF) IV  2 g Intravenous To SS-Surg  . heparin  5,000 Units Subcutaneous 3 times per day  . metoprolol tartrate  25 mg Oral BID  . piperacillin-tazobactam (ZOSYN)  IV  3.375 g Intravenous 3 times per day  . senna-docusate  1 tablet Oral QHS  . sodium chloride flush  3 mL Intravenous Q12H  . vancomycin  1,000 mg Intravenous Q24H   Continuous Infusions: . 0.9 % sodium chloride with kcl 50 mL/hr at 06/28/15 0700   PRN Meds:.acetaminophen **OR** acetaminophen, HYDROcodone-acetaminophen, ondansetron **OR** ondansetron (ZOFRAN) IV, sodium chloride   Imaging: Ct Abdomen Pelvis Wo Contrast  06/26/2015  CLINICAL DATA:  Acute anemia. Evaluate for  retroperitoneal bleed. Severe sepsis with acute renal failure. EXAM: CT ABDOMEN AND PELVIS WITHOUT CONTRAST TECHNIQUE: Multidetector CT imaging of the abdomen and pelvis was performed following the standard protocol without IV contrast. COMPARISON:  CT virtual colonoscopy 01/27/2015. Abdominal pelvic CT 11/01/2011. FINDINGS: Lower chest: There are new small dependent low-density pleural effusions bilaterally with associated patchy atelectasis at both lung bases. The heart remains enlarged. Hepatobiliary: As evaluated in the noncontrast state, the liver appears unremarkable. The gallbladder appears unremarkable. Previously noted biliary dilatation appears improved. Pancreas: Appears atrophied. No evidence of focal abnormality or surrounding inflammation. Spleen: Stable appearance with multiple small granulomas. Adrenals/Urinary Tract: Stable without suspicious findings. There is stable subcapsular calcification posteriorly in the mid left kidney and a stable interpolar cyst in the right kidney, measuring up to 6.2 cm. No evidence of hydronephrosis. Stable tiny calculi in the lower pole of the left kidney. The bladder is partly obscured by artifact from the bilateral total hip arthroplasties, although demonstrates no abnormality. Stomach/Bowel: No evidence of bowel wall thickening, distention or surrounding inflammatory change. Vascular/Lymphatic: There are no enlarged abdominal or pelvic lymph nodes. There is diffuse atherosclerosis of the aorta, its branches and the iliac arteries. No evidence of retroperitoneal hematoma. Reproductive: Partly obscured by artifact from total hip arthroplasties. Grossly stable. Other: Mild subcutaneous edema within the abdominal wall bilaterally. Apparent asymmetric enlargement of the proximal right thigh without demonstrated  focal hematoma. No evidence of hemoperitoneum. Musculoskeletal: No acute or significant osseous findings. Chronic appearing superior endplate compression  deformities are noted at T10, T12 and L2. Previous bilateral total hip arthroplasty. IMPRESSION: 1. No evidence of retroperitoneal hematoma. There is mild asymmetric enlargement of the proximal right thigh without apparent hematoma. Correlate clinically. 2. Generalized soft tissue edema with new bilateral pleural effusions suspicious for anasarca. 3. Stable subcapsular calcification posteriorly in the left kidney and right renal cyst. No evidence of hydronephrosis or ureteral calculus. Electronically Signed   By: Richardean Sale M.D.   On: 06/26/2015 21:03   Ir Ivc Filter Plmt / S&i /img Guid/mod Sed  06/27/2015  CLINICAL DATA:  Right femur fracture and right lower extremity DVT. Prior to planned orthopedic surgery, request is been made to place an IVC filter. EXAM: 1. ULTRASOUND GUIDANCE FOR VASCULAR ACCESS OF THE RIGHT INTERNAL JUGULAR VEIN. 2. IVC VENOGRAM. 3. PERCUTANEOUS IVC FILTER PLACEMENT. ANESTHESIA/SEDATION: 50 mcg IV Fentanyl. Total Moderate Sedation Time 15 minutes. The patient's level of consciousness and physiologic status were continuously monitored during the procedure by Radiology nursing. CONTRAST:  69mL OMNIPAQUE IOHEXOL 300 MG/ML  SOLN FLUOROSCOPY TIME:  30 seconds. PROCEDURE: The procedure, risks, benefits, and alternatives were explained to the patient. Questions regarding the procedure were encouraged and answered. The patient understands and consents to the procedure. A time-out was performed prior to the procedure. The right neck was prepped with chlorhexidine in a sterile fashion, and a sterile drape was applied covering the operative field. A sterile gown and sterile gloves were used for the procedure. Local anesthesia was provided with 1% Lidocaine. Ultrasound was used to confirm patency of the right internal jugular vein. Under direct ultrasound guidance, a 21 gauge needle was advanced into the right internal jugular vein with ultrasound image documentation performed. After securing  access with a micropuncture dilator, a guidewire was advanced into the inferior vena cava. A deployment sheath was advanced over the guidewire. This was utilized to perform IVC venography. The deployment sheath was further positioned in an appropriate location for filter deployment. A Bard Denali IVC filter was then advanced in the sheath. This was then fully deployed in the infrarenal IVC. Final filter position was confirmed with a fluoroscopic spot image. Contrast injection was also performed through the sheath under fluoroscopy to confirm patency of the IVC at the level of the filter. After the procedure the sheath was removed and hemostasis obtained with manual compression. COMPLICATIONS: None. FINDINGS: IVC venography demonstrates a normal caliber IVC with no evidence of thrombus. Renal veins are identified bilaterally. The IVC is tortuous. The IVC filter was successfully positioned below the level of the renal veins and is appropriately oriented. This IVC filter has both permanent and retrievable indications. IMPRESSION: Placement of percutaneous IVC filter in infrarenal IVC. IVC venogram shows no evidence of IVC thrombus and normal caliber of the inferior vena cava. This filter does have both permanent and retrievable indications. Electronically Signed   By: Aletta Edouard M.D.   On: 06/27/2015 17:41    Cardiac Studies: Echo pending  Assessment/Plan:  82 y.o. 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 He was found to have acute renal insufficiency, suspected sepsis, and fracture of his Rt hip. Pt seen for pre op clearance 06/26/15.  Pradaxa has been held in anticipation of hip repair 06/29/15. He was found to have an acute DVT in his RLE and on 06/27/15 he had  an IVC filter placed.   Principal Problem:   Severe sepsis- found down at nursing home Active Problems:   Fracture - Rt hip prosthesis this adm   Chronic a-fib (HCC)   Acute renal insufficiency-resolved   History of right hip replacement   Chronic anticoagulation-Pradaxa prior to admission   DVT of Rt lower limb, acute ([place])   S/P IVC filter 06/27/15   S/P Lt hip replacement, chronically infected [facility])   EMPHYSEMA   RBBB   PLAN: Echo pending, done this am. Will follow post op. Rate controlled on Lopressor.   Kerin Ransom PA-C 06/28/2015, 8:52 AM [phone]  I have personally seen and examined this patient with Kerin Ransom, PA-C. I agree with the assessment and plan as outlined above.  82 yo male admitted after fall sustaining right hip fracture. He has rate controlled atrial fibrillation. He is on Pradaxa as an outpatient but on hold for procedure. Given his advanced age and other comorbidities, he is high risk for any surgical procedure. BP is stable. HR is in the 80-90s, atrial fib on tele. He is back on a beta blocker. Echo today. Regardless of findings on echo, he is not likely a candidate for invasive cardiac evaluation and if surgery is necessary, as it seems to be in this case, I would proceed later this week as planned, despite high risk. His quality of life without the orthopedic procedure will likely not be good. He understands the risk involved.    MCALHANY,CHRISTOPHER 06/28/2015. 11:30 AM

## 2015-06-28 NOTE — H&P (Signed)

## 2015-06-28 NOTE — Progress Notes (Signed)
Patient is improving medically.  I am still in the process of obtaining op notes from [facility] from primary team.  Surgery will be scheduled for Thursday.    Azucena Cecil, MD [place] 7:26 AM

## 2015-06-28 NOTE — Progress Notes (Signed)
  Echocardiogram 2D Echocardiogram has been performed.  Darlina Sicilian M 06/28/2015, 9:26 AM

## 2015-06-28 NOTE — Progress Notes (Signed)
[facility] updated with time of pt's surgery tomorrow. [facility] plans on being at [facility] bedside tomorrow and would like to speak to MD prior to surgery.

## 2015-06-28 NOTE — Progress Notes (Signed)
PATIENT DETAILS Name: Gerald Sandoval Age: 82 y.o. 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 He was found to have sepsis of unknown etiology, acute renal failure, and a right periprosthetic hip fracture. He was managed in the stepdown unit, was placed on empiric antibiotics, IV fluids, orthopedics was consulted. Although sepsis pathophysiology has resolved, Hospital course has been complicated by development of worsening anemia, right lower extremity DVT requiring discontinuation of anticoagulation and placement of IVC filter. Patient has been seen by cardiology and deemed to be a high risk candidate for orthopedic surgery, patient understands risks and is willing to proceed with surgery. Please see below for further details  Subjective: In good spirits-does not have pain in his right hip.  Assessment/Plan: Severe sepsis: Etiology unknown, however sepsis pathophysiology has resolved. Blood/urine cultures negative. Discontinue vancomycin, continue Zosyn-suspect we could narrow antibiotic spectrum further postoperatively and transitioning back to amoxicillin (that he takes chronically for right hip infection).  Right hip periprosthetic fracture: Following a mechanical fall, orthopedics following-plans are OR are on 2/2. Cardiology following for preoperative risk stratification-deemed to be a high risk-but okay to proceed. Patient accepts risks, and is willing to proceed with surgery.   Acute on chronic anemia: Suspect acute anemia multifactorial from chronic blood loss, acute illness and renal failure. No overt evidence  of GI bleed (FOBT negative this admission) or evidence of retroperitoneal hematoma on CT scan. Status post PRBC transfusion, hemoglobin currently stable-Follow.Pradaxa has been discontinued.  Acute renal failure: Resolved. Likely secondary to prerenal azotemia in the setting of sepsis. .  Right lower extremity DVT: Difficult situation-significant anemia with suspicion for ongoing chronic blood loss-poor candidate for long-term anticoagulation. IVC filter placed 1/31.  History of atrial fibrillation: Rate controlled-he was on Pradaxa as an outpatient but on hold for procedure-not sure with anemia, history of fall-is a great long-term anticoagulation candidate. Echo shows EF around 50-55%.  We will need to address this as we move forward.  History of infected prosthetic left hip: On chronic suppressive therapy with amoxicillin-no indication of abscess in CT abdomen/pelvis. Suspect could be transitioned to oral amoxicillin in the next few days.  Thrombocytopenia:? Etiology-but suspect secondary to sepsis, platelet consumption DVT. Since mild-would just follow for now. We will need to discontinue SQ heparin for now. If drops further-will need to send HIT panel.  Minimally elevated troponin: Trend not consistent with ACS. Supportive care. Echo shows preserved ejection fraction  Mild rhabdomyolysis: Likely secondary to be down prior to admission. CK downtrending with supportive care.  Disposition: Remain inpatient  Antimicrobial agents  See below  Anti-infectives    Start     Dose/Rate Route Frequency Ordered Stop   06/29/15 1500  ceFAZolin (ANCEF) IVPB 2 g/50 mL premix    Comments:  Anesthesia to give preop   2 g 100 mL/hr over 30 Minutes Intravenous To [facility] 06/28/15 0727 06/30/15 1500   06/28/15 0700  vancomycin (VANCOCIN) IVPB 1000 mg/200 mL premix     1,000 mg 200 mL/hr over 60 Minutes Intravenous Every 24 hours 06/27/15 1253     06/27/15 1000  vancomycin (VANCOCIN) IVPB  1000 mg/200 mL premix  Status:  Discontinued     1,000 mg 200 mL/hr over 60 Minutes Intravenous Every 48 hours 06/25/15 1358 06/25/15 1633   06/26/15 0930  vancomycin (VANCOCIN) IVPB 750 mg/150 ml premix  Status:  Discontinued     750 mg 150 mL/hr over 60 Minutes Intravenous Every 24 hours 06/25/15 1633 06/27/15 1253   06/25/15 1730  piperacillin-tazobactam (ZOSYN) IVPB 3.375 g     3.375 g 12.5 mL/hr over 240 Minutes Intravenous 3 times per day 06/25/15 1633     06/25/15 1700  piperacillin-tazobactam (ZOSYN) IVPB 2.25 g  Status:  Discontinued     2.25 g 100 mL/hr over 30 Minutes Intravenous Every 8 hours 06/25/15 1358 06/25/15 1633   06/25/15 0930  vancomycin (VANCOCIN) 1,500 mg in sodium chloride 0.9 % 500 mL IVPB     1,500 mg 250 mL/hr over 120 Minutes Intravenous  Once 06/25/15 0849 06/25/15 1249   06/25/15 0900  piperacillin-tazobactam (ZOSYN) IVPB 3.375 g     3.375 g 100 mL/hr over 30 Minutes Intravenous  Once 06/25/15 0849 06/25/15 0930      DVT Prophylaxis: None-no heparin for worsening thrombocytopenia, no SCD's due to DVT   Code Status:  DNR  Family Communication None  Procedures: 1/31>>IVC filter  CONSULTS:  cardiology, orthopedic surgery and IR and palliative care  Time spent 30 minutes-Greater than 50% of this time was spent in counseling, explanation of diagnosis, planning of further management, and coordination of care.  MEDICATIONS: Scheduled Meds: . sodium chloride   Intravenous Once  . [START ON 06/29/2015]  ceFAZolin (ANCEF) IV  2 g Intravenous To SS-Surg  . heparin  5,000 Units Subcutaneous 3 times per day  . metoprolol tartrate  25 mg Oral BID  . piperacillin-tazobactam (ZOSYN)  IV  3.375 g Intravenous 3 times per day  . senna-docusate  1 tablet Oral QHS  . sodium chloride flush  3 mL Intravenous Q12H  . vancomycin  1,000 mg Intravenous Q24H   Continuous Infusions: . 0.9 % sodium chloride with kcl 50 mL/hr at 06/28/15 0700   PRN  Meds:.acetaminophen **OR** acetaminophen, HYDROcodone-acetaminophen, ondansetron **OR** ondansetron (ZOFRAN) IV, sodium chloride    PHYSICAL EXAM: Vital signs in last 24 hours: Filed Vitals:   06/27/15 2308 06/27/15 2310 06/28/15 0318 06/28/15 0815  BP:  120/86  144/82  Pulse:  88  91  Temp: 98.4 F (36.9 C)  97.9 F (36.6 C) 97.7 F (36.5 C)  TempSrc: Oral  Oral Oral  Resp:    25  Height:      Weight:      SpO2:  98%  98%    Weight change:  Filed Weights   06/25/15 1450  Weight: 69.5 kg (153 lb 3.5 oz)   Body mass index is 23.3 kg/(m^2).   Gen Exam: Awake and alert with clear speech.   Neck: Supple, No JVD.   Chest: B/L Clear.   CVS: S1 S2 Regular, no murmurs. Abdomen: soft, BS +, non tender, non distended.  Extremities: no edema, lower extremities warm to touch. Neurologic: Non Focal.   Skin: No Rash Wounds: N/A.    Intake/Output from previous day:  Intake/Output Summary (Last 24 hours) at 06/28/15 1144 Last data filed at 06/28/15 0815  Gross per 24 hour  Intake 2106.67 ml  Output   2176 ml  Net -69.33 ml     LAB RESULTS: CBC  Recent Labs Lab 06/25/15 0832  06/25/15 1600  06/26/15 0328 06/26/15 0648 06/26/15 1838  06/27/15 0432 06/28/15 0630  WBC 23.8*  < > 22.5*  < > 25.7* 24.3* 20.6* 16.3* 10.7*  HGB 9.3*  < > 8.1*  < > 6.8* 6.5* 8.3* 8.4* 11.2*  HCT 27.1*  < > 22.6*  < > 19.0* 17.8* 23.4* 24.0* 30.9*  PLT 141*  < > 123*  < > 104* 95* 95* 98* 82*  MCV 98.9  < > 95.4  < > 95.0 94.7 91.8 92.0 90.9  MCH 33.9  < > 34.2*  < > 34.0 34.6* 32.5 32.2 32.9  MCHC 34.3  < > 35.8  < > 35.8 36.5* 35.5 35.0 36.2*  RDW 13.5  < > 13.2  < > 13.7 13.6 15.9* 16.5* 16.6*  LYMPHSABS 1.0  --  0.8  --   --   --   --   --   --   MONOABS 0.8  --  1.4*  --   --   --   --   --   --   EOSABS 0.0  --  0.0  --   --   --   --   --   --   BASOSABS 0.0  --  0.0  --   --   --   --   --   --   < > = values in this interval not displayed.  Chemistries   Recent Labs Lab  06/26/15 0648 06/26/15 1837 06/27/15 0432 06/27/15 0802 06/28/15 0630  NA 130* 136 134* 132* 133*  K 3.9 3.8 3.3* 3.3* 4.2  CL 102 103 104 103 101  CO2 23 24 22 22  19*  GLUCOSE 123* 116* 94 95 89  BUN 15 15 12 12 12   CREATININE 1.36* 1.19 0.99 1.00 0.98  CALCIUM 7.5* 7.7* 7.7* 7.5* 8.0*    CBG:  Recent Labs Lab 06/25/15 0850  GLUCAP 236*    GFR Estimated Creatinine Clearance: 49.4 mL/min (by C-G formula based on Cr of 0.98).  Coagulation profile  Recent Labs Lab 06/25/15 1600  INR 1.97*    Cardiac Enzymes  Recent Labs Lab 06/25/15 0832 06/26/15 1837 06/26/15 2220  TROPONINI 0.04* 0.07* 0.09*    Invalid input(s): POCBNP No results for input(s): DDIMER in the last 72 hours. No results for input(s): HGBA1C in the last 72 hours. No results for input(s): CHOL, HDL, LDLCALC, TRIG, CHOLHDL, LDLDIRECT in the last 72 hours.  Recent Labs  06/26/15 1837  TSH 2.430   No results for input(s): VITAMINB12, FOLATE, FERRITIN, TIBC, IRON, RETICCTPCT in the last 72 hours. No results for input(s): LIPASE, AMYLASE in the last 72 hours.  Urine Studies No results for input(s): UHGB, CRYS in the last 72 hours.  Invalid input(s): UACOL, UAPR, USPG, UPH, UTP, UGL, UKET, UBIL, UNIT, UROB, ULEU, UEPI, UWBC, URBC, UBAC, CAST, UCOM, BILUA  MICROBIOLOGY: Recent Results (from the past 240 hour(s))  Culture, blood (routine x 2)     Status: None (Preliminary result)   Collection Time: 06/25/15  8:25 AM  Result Value Ref Range Status   Specimen Description BLOOD LEFT ANTECUBITAL  Final   Special Requests   Final    BOTTLES DRAWN AEROBIC AND ANAEROBIC 5CC ANA 6CC AER   Culture NO GROWTH 3 DAYS  Final   Report Status PENDING  Incomplete  Culture, blood (routine x 2)     Status: None (Preliminary result)   Collection Time: 06/25/15  8:30 AM  Result Value Ref Range Status   Specimen Description BLOOD RIGHT HAND  Final   Special Requests BOTTLES DRAWN AEROBIC ONLY 5CC  Final    Culture NO GROWTH 3 DAYS  Final   Report Status PENDING  Incomplete  Urine culture     Status: None   Collection Time: 06/25/15  9:11 AM  Result Value Ref Range Status   Specimen Description URINE, CATHETERIZED  Final   Special Requests NONE  Final   Culture NO GROWTH 1 DAY  Final   Report Status 06/26/2015 FINAL  Final  MRSA PCR Screening     Status: None   Collection Time: 06/25/15  3:28 PM  Result Value Ref Range Status   MRSA by PCR NEGATIVE NEGATIVE Final    Comment:        The GeneXpert MRSA Assay (FDA approved for NASAL specimens only), is one component of a comprehensive MRSA colonization surveillance program. It is not intended to diagnose MRSA infection nor to guide or monitor treatment for MRSA infections.     RADIOLOGY STUDIES/RESULTS: Ct Abdomen Pelvis Wo Contrast  06/26/2015  CLINICAL DATA:  Acute anemia. Evaluate for retroperitoneal bleed. Severe sepsis with acute renal failure. EXAM: CT ABDOMEN AND PELVIS WITHOUT CONTRAST TECHNIQUE: Multidetector CT imaging of the abdomen and pelvis was performed following the standard protocol without IV contrast. COMPARISON:  CT virtual colonoscopy 01/27/2015. Abdominal pelvic CT 11/01/2011. FINDINGS: Lower chest: There are new small dependent low-density pleural effusions bilaterally with associated patchy atelectasis at both lung bases. The heart remains enlarged. Hepatobiliary: As evaluated in the noncontrast state, the liver appears unremarkable. The gallbladder appears unremarkable. Previously noted biliary dilatation appears improved. Pancreas: Appears atrophied. No evidence of focal abnormality or surrounding inflammation. Spleen: Stable appearance with multiple small granulomas. Adrenals/Urinary Tract: Stable without suspicious findings. There is stable subcapsular calcification posteriorly in the mid left kidney and a stable interpolar cyst in the right kidney, measuring up to 6.2 cm. No evidence of hydronephrosis. Stable tiny  calculi in the lower pole of the left kidney. The bladder is partly obscured by artifact from the bilateral total hip arthroplasties, although demonstrates no abnormality. Stomach/Bowel: No evidence of bowel wall thickening, distention or surrounding inflammatory change. Vascular/Lymphatic: There are no enlarged abdominal or pelvic lymph nodes. There is diffuse atherosclerosis of the aorta, its branches and the iliac arteries. No evidence of retroperitoneal hematoma. Reproductive: Partly obscured by artifact from total hip arthroplasties. Grossly stable. Other: Mild subcutaneous edema within the abdominal wall bilaterally. Apparent asymmetric enlargement of the proximal right thigh without demonstrated focal hematoma. No evidence of hemoperitoneum. Musculoskeletal: No acute or significant osseous findings. Chronic appearing superior endplate compression deformities are noted at T10, T12 and L2. Previous bilateral total hip arthroplasty. IMPRESSION: 1. No evidence of retroperitoneal hematoma. There is mild asymmetric enlargement of the proximal right thigh without apparent hematoma. Correlate clinically. 2. Generalized soft tissue edema with new bilateral pleural effusions suspicious for anasarca. 3. Stable subcapsular calcification posteriorly in the left kidney and right renal cyst. No evidence of hydronephrosis or ureteral calculus. Electronically Signed   By: Richardean Sale M.D.   On: 06/26/2015 21:03   Ct Head Wo Contrast  06/25/2015  CLINICAL DATA:  Found on floor, unwitnessed fall. Head laceration. Altered mental status, confusion EXAM: CT HEAD WITHOUT CONTRAST CT CERVICAL SPINE WITHOUT CONTRAST TECHNIQUE: Multidetector CT imaging of the head and cervical spine was performed following the standard protocol without intravenous contrast. Multiplanar CT image reconstructions of the cervical spine were also generated. COMPARISON:  None. FINDINGS: CT HEAD FINDINGS There is atrophy and chronic  small vessel  disease changes. No acute intracranial abnormality. Specifically, no hemorrhage, hydrocephalus, mass lesion, acute infarction, or significant intracranial injury. No acute calvarial abnormality. Visualized paranasal sinuses and mastoids clear. Orbital soft tissues unremarkable. CT CERVICAL SPINE FINDINGS Diffuse degenerative facet disease throughout the cervical spine. Degenerative disc disease in the lower cervical spine. Prevertebral soft tissues are normal. Alignment is normal. No fracture. No epidural or paraspinal hematoma. IMPRESSION: No acute intracranial abnormality. Atrophy, chronic microvascular disease. Degenerative changes in the cervical spine. No acute bony abnormality. Electronically Signed   By: Rolm Baptise M.D.   On: 06/25/2015 09:58   Ct Cervical Spine Wo Contrast  06/25/2015  CLINICAL DATA:  Found on floor, unwitnessed fall. Head laceration. Altered mental status, confusion EXAM: CT HEAD WITHOUT CONTRAST CT CERVICAL SPINE WITHOUT CONTRAST TECHNIQUE: Multidetector CT imaging of the head and cervical spine was performed following the standard protocol without intravenous contrast. Multiplanar CT image reconstructions of the cervical spine were also generated. COMPARISON:  None. FINDINGS: CT HEAD FINDINGS There is atrophy and chronic small vessel disease changes. No acute intracranial abnormality. Specifically, no hemorrhage, hydrocephalus, mass lesion, acute infarction, or significant intracranial injury. No acute calvarial abnormality. Visualized paranasal sinuses and mastoids clear. Orbital soft tissues unremarkable. CT CERVICAL SPINE FINDINGS Diffuse degenerative facet disease throughout the cervical spine. Degenerative disc disease in the lower cervical spine. Prevertebral soft tissues are normal. Alignment is normal. No fracture. No epidural or paraspinal hematoma. IMPRESSION: No acute intracranial abnormality. Atrophy, chronic microvascular disease. Degenerative changes in the cervical  spine. No acute bony abnormality. Electronically Signed   By: Rolm Baptise M.D.   On: 06/25/2015 09:58   Ir Ivc Filter Plmt / S&i /img Guid/mod Sed  06/27/2015  CLINICAL DATA:  Right femur fracture and right lower extremity DVT. Prior to planned orthopedic surgery, request is been made to place an IVC filter. EXAM: 1. ULTRASOUND GUIDANCE FOR VASCULAR ACCESS OF THE RIGHT INTERNAL JUGULAR VEIN. 2. IVC VENOGRAM. 3. PERCUTANEOUS IVC FILTER PLACEMENT. ANESTHESIA/SEDATION: 50 mcg IV Fentanyl. Total Moderate Sedation Time 15 minutes. The patient's level of consciousness and physiologic status were continuously monitored during the procedure by Radiology nursing. CONTRAST:  47mL OMNIPAQUE IOHEXOL 300 MG/ML  SOLN FLUOROSCOPY TIME:  30 seconds. PROCEDURE: The procedure, risks, benefits, and alternatives were explained to the patient. Questions regarding the procedure were encouraged and answered. The patient understands and consents to the procedure. A time-out was performed prior to the procedure. The right neck was prepped with chlorhexidine in a sterile fashion, and a sterile drape was applied covering the operative field. A sterile gown and sterile gloves were used for the procedure. Local anesthesia was provided with 1% Lidocaine. Ultrasound was used to confirm patency of the right internal jugular vein. Under direct ultrasound guidance, a 21 gauge needle was advanced into the right internal jugular vein with ultrasound image documentation performed. After securing access with a micropuncture dilator, a guidewire was advanced into the inferior vena cava. A deployment sheath was advanced over the guidewire. This was utilized to perform IVC venography. The deployment sheath was further positioned in an appropriate location for filter deployment. A Bard Denali IVC filter was then advanced in the sheath. This was then fully deployed in the infrarenal IVC. Final filter position was confirmed with a fluoroscopic spot image.  Contrast injection was also performed through the sheath under fluoroscopy to confirm patency of the IVC at the level of the filter. After the procedure the sheath was removed and hemostasis obtained with  manual compression. COMPLICATIONS: None. FINDINGS: IVC venography demonstrates a normal caliber IVC with no evidence of thrombus. Renal veins are identified bilaterally. The IVC is tortuous. The IVC filter was successfully positioned below the level of the renal veins and is appropriately oriented. This IVC filter has both permanent and retrievable indications. IMPRESSION: Placement of percutaneous IVC filter in infrarenal IVC. IVC venogram shows no evidence of IVC thrombus and normal caliber of the inferior vena cava. This filter does have both permanent and retrievable indications. Electronically Signed   By: Aletta Edouard M.D.   On: 06/27/2015 17:41   Ct Femur Right Wo Contrast  06/25/2015  CLINICAL DATA:  Leg pain. Pacemaker precludes MRI. Clinical concern for compartment syndrome. EXAM: CT OF THE RIGHT FEMUR WITHOUT CONTRAST; CT OF THE RIGHT TIBIA FIBULA WITHOUT CONTRAST TECHNIQUE: Multidetector CT imaging was performed according to the standard protocol. Multiplanar CT image reconstructions were also generated. COMPARISON:  None. FINDINGS: CT right femur: Right total hip prosthesis observed with a periprosthetic fracture tracking from the greater trochanter longitudinal along the stem. There is an oblique component which tracks medially and is displaced 1.4 cm posteromedial from the rest of the shaft. The tip of this oblique proximal medial fragment is about at the level of the tip of the implant. However, the fracture appears to extend slightly further in a longitudinal fashion posteromedially as shown on images 87 through 112 of series 1. Below this level the fracture plane is no longer well shown by CT. Bony demineralization is present. Common is requested for compartment syndrome. Assessment of the  upper thigh is especially problematic due to the extensive streak artifact from the implant which can obscure the subtle intramuscular effacement of adipose tissue and other signs of compartment syndrome. I see no specific evidence of compartment syndrome but compartmental pressures would be a much more reliable check. Further distally and below the level of the implant, the visualized muscle compartments appear unusually hypertrophic/ well-developed for age, but still with marbling of adipose tissue in the muscles which would argue against a compartment syndrome. There is a small knee effusion along with some prepatellar edema. Diffuse atherosclerotic calcification is observed. CT right lower leg: No visualized tubular fibular fracture is observed; the distal most tip of the fibula is excluded. Diffuse atherosclerosis. There is subcutaneous edema tracking in the calf diffusely. Fatty marbling in the muscle tissues is present, slightly less notable in the tibialis anterior, but the other anterior compartmental muscles appear unremarkable and accordingly I am skeptical of compartment syndrome in the calf. IMPRESSION: 1. Periprosthetic fracture along the right hip implant, extending from the greater trochanter along the stem of the implant. This has a displaced component, with the medial fracture fragment about 1.4 cm posteromedial to the rest of the shaft at its distal tip. This distal tip is at about the level of the stem of the implant. There is also a longitudinal fracture component extending distally from the level of the tip of the implant within the shaft, which becomes indistinct several sent centimeters distally. 2. There is subcutaneous edema in the thigh and calf, but reasonably normal marbling of adipose tissue in the muscles arguing against compartment syndrome. Clearly muscle compartment pressures are more sensitive and specific measure, and particularly in the upper thigh in the vicinity of the implant  the streak artifact from the metal further reduces diagnostic sensitivity. If there is continued clinical concern for compartment syndrome than compartment manometry would be recommended. 3. Small knee effusion. 4. No  gas tracking in the soft tissues, or specific findings of high concern for abscess. Electronically Signed   By: Van Clines M.D.   On: 06/25/2015 14:29   Ct Tibia Fibula Right Wo Contrast  06/25/2015  CLINICAL DATA:  Leg pain. Pacemaker precludes MRI. Clinical concern for compartment syndrome. EXAM: CT OF THE RIGHT FEMUR WITHOUT CONTRAST; CT OF THE RIGHT TIBIA FIBULA WITHOUT CONTRAST TECHNIQUE: Multidetector CT imaging was performed according to the standard protocol. Multiplanar CT image reconstructions were also generated. COMPARISON:  None. FINDINGS: CT right femur: Right total hip prosthesis observed with a periprosthetic fracture tracking from the greater trochanter longitudinal along the stem. There is an oblique component which tracks medially and is displaced 1.4 cm posteromedial from the rest of the shaft. The tip of this oblique proximal medial fragment is about at the level of the tip of the implant. However, the fracture appears to extend slightly further in a longitudinal fashion posteromedially as shown on images 87 through 112 of series 1. Below this level the fracture plane is no longer well shown by CT. Bony demineralization is present. Common is requested for compartment syndrome. Assessment of the upper thigh is especially problematic due to the extensive streak artifact from the implant which can obscure the subtle intramuscular effacement of adipose tissue and other signs of compartment syndrome. I see no specific evidence of compartment syndrome but compartmental pressures would be a much more reliable check. Further distally and below the level of the implant, the visualized muscle compartments appear unusually hypertrophic/ well-developed for age, but still with  marbling of adipose tissue in the muscles which would argue against a compartment syndrome. There is a small knee effusion along with some prepatellar edema. Diffuse atherosclerotic calcification is observed. CT right lower leg: No visualized tubular fibular fracture is observed; the distal most tip of the fibula is excluded. Diffuse atherosclerosis. There is subcutaneous edema tracking in the calf diffusely. Fatty marbling in the muscle tissues is present, slightly less notable in the tibialis anterior, but the other anterior compartmental muscles appear unremarkable and accordingly I am skeptical of compartment syndrome in the calf. IMPRESSION: 1. Periprosthetic fracture along the right hip implant, extending from the greater trochanter along the stem of the implant. This has a displaced component, with the medial fracture fragment about 1.4 cm posteromedial to the rest of the shaft at its distal tip. This distal tip is at about the level of the stem of the implant. There is also a longitudinal fracture component extending distally from the level of the tip of the implant within the shaft, which becomes indistinct several sent centimeters distally. 2. There is subcutaneous edema in the thigh and calf, but reasonably normal marbling of adipose tissue in the muscles arguing against compartment syndrome. Clearly muscle compartment pressures are more sensitive and specific measure, and particularly in the upper thigh in the vicinity of the implant the streak artifact from the metal further reduces diagnostic sensitivity. If there is continued clinical concern for compartment syndrome than compartment manometry would be recommended. 3. Small knee effusion. 4. No gas tracking in the soft tissues, or specific findings of high concern for abscess. Electronically Signed   By: Van Clines M.D.   On: 06/25/2015 14:29   Dg Chest Port 1 View  06/26/2015  CLINICAL DATA:  Sepsis and edema. EXAM: PORTABLE CHEST - 1 VIEW  COMPARISON:  One-view chest 06/25/2015. FINDINGS: The heart is enlarged. Pulmonary arteries are enlarged. Atherosclerotic changes are again noted at the  aorta. Minimal bibasilar airspace disease likely reflects atelectasis. There is no edema or effusion to suggest failure. Emphysematous changes are suggested. The visualized soft tissues and bony thorax are unremarkable. IMPRESSION: 1. Cardiomegaly without failure. 2. Atherosclerosis. 3. Minimal bibasilar airspace disease likely reflects atelectasis. Electronically Signed   By: San Morelle M.D.   On: 06/26/2015 07:34   Dg Chest Portable 1 View  06/25/2015  CLINICAL DATA:  Found unresponsive, shortness of breath EXAM: PORTABLE CHEST 1 VIEW COMPARISON:  06/22/2007 FINDINGS: Cardiac shadow is stable. The lungs are well aerated bilaterally. No pneumothorax or focal infiltrate is seen. No acute fracture is noted. IMPRESSION: No acute abnormality seen. Electronically Signed   By: Inez Catalina M.D.   On: 06/25/2015 08:40   Dg Hip Unilat With Pelvis 2-3 Views Right  06/25/2015  CLINICAL DATA:  Right hip pain after fall. Periprosthetic fracture on CT. EXAM: DG HIP (WITH OR WITHOUT PELVIS) 2-3V RIGHT COMPARISON:  CT femur earlier this day. FINDINGS: Right hip arthroplasty, with known periprosthetic fracture about the greater trochanter and femoral stem. This is better characterized on recently performed CT. The displaced distal component is visualized. Femoral head component remains seated in the acetabular cup. Vascular calcifications are seen. IMPRESSION: Known periprosthetic fracture about the femoral stem of the right hip arthroplasty, better characterized on recent CT. Electronically Signed   By: Jeb Levering M.D.   On: 06/25/2015 21:07    Oren Binet, MD  [facility] Pager:[phone]  If 7PM-7AM, please contact night-coverage [email or web address] Password [email or web address] 06/28/2015, 11:44 AM   LOS: 3 days

## 2015-06-28 NOTE — Care Management Important Message (Signed)
Important Message  Patient Details  Name: Gerald Sandoval MRN: [id number] Date of Birth: Jun 27, 1923   Medicare Important Message Given:  Yes    [name] 06/28/2015, 1:27 PM

## 2015-06-28 NOTE — Evaluation (Signed)
Clinical/Bedside Swallow Evaluation Patient Details  Name: Gerald Sandoval MRN: [id number] Date of Birth: 10/31/1923  Today's Date: 06/28/2015 Time: SLP Start Time (ACUTE ONLY): 0750 SLP Stop Time (ACUTE ONLY): [id number] SLP Time Calculation (min) (ACUTE ONLY): 22 min  Past Medical History:  Past Medical History  Diagnosis Date  . Permanent atrial fibrillation ([place])   . Osteoarthritis   . Gastritis   . Gastric ulcer   . History of colonic polyps   . Dysphagia     unspecified  . Emphysema   . Diastolic dysfunction   . BPH (benign prostatic hyperplasia)   . Rotator cuff injury   . Hernia     Rt. Inguinial  . RBBB (right bundle branch block)   . Kidney stones   . Osteoporosis   . Abscess of left hip     infection left hip  . Hypogonadism in male   . Anemia   . Colon polyp    Past Surgical History:  Past Surgical History  Procedure Laterality Date  . Transurethral resection of prostate    . Inguinal hernia repair      RT  . Total hip arthroplasty      Both hips; Left in 2004, Right in 2007  . Resection of ribs     HPI:  Pt is an 82 year old male arriving after a mechanical fall resulting in right hip fx. Plan for ORIF on Wed/Thurs. Found to have DVT, profound anemia, elevated WBC, possible sepsis. No history of dysphagia in chart  however pt admits to large pill dysphagia x 3 months.     Assessment / Plan / Recommendation Clinical Impression  Pt presents with functional oropharyngeal swallow given lack of dentition and xerostomia.  CN exam unremarkable.  No s/s of aspiration with any po observed.  HOB restricted to 30* per order therefore SLP positioned head upright with pillow.  Given pt with no dentures currently but uses them at home, recommend Dys3/thin diet with general precautions.    Pt does admit to large pill dysphagia x 3 months- lodging in throat sensation- therefore rec large pills with pudding and follow with liquids.     No follow up indicated.  Thanks for this  order.     Aspiration Risk  Mild aspiration risk    Diet Recommendation Dysphagia 3 (Mech soft);Thin liquid   Liquid Administration via: Cup;Straw Medication Administration: Whole meds with puree (small pills with liquids) Supervision: Patient able to self feed (set up assist needed) Compensations: Slow rate;Small sips/bites Postural Changes:  (upright as much able)    Other  Recommendations Oral Care Recommendations: Oral care BID Other Recommendations: Clarify dietary restrictions   Follow up Recommendations  None    Frequency and Duration     n/a       Prognosis        Swallow Study   General Date of Onset: 06/28/15 HPI: Pt is an 82 year old male arriving after a mechanical fall resulting in right hip fx. Plan for ORIF on Wed/Thurs. Found to have DVT, profound anemia, elevated WBC, possible sepsis. No history of dysphagia in chart  however pt admits to large pill dysphagia x 3 months.   Type of Study: Bedside Swallow Evaluation Diet Prior to this Study: NPO Temperature Spikes Noted: No Respiratory Status: Room air History of Recent Intubation: No Behavior/Cognition: Alert;Cooperative;Pleasant mood Oral Cavity Assessment: Dry Oral Care Completed by SLP: No Oral Cavity - Dentition: Edentulous;Dentures, not available Vision: Functional for self-feeding Self-Feeding  Abilities: Able to feed self;Needs set up Patient Positioning: Partially reclined (up to 30* only per Md order) Baseline Vocal Quality: Normal Volitional Cough: Strong Volitional Swallow: Able to elicit    Oral/Motor/Sensory Function Overall Oral Motor/Sensory Function: Within functional limits   Ice Chips Ice chips: Not tested   Thin Liquid Thin Liquid: Within functional limits Presentation: Straw;Self Fed    Nectar Thick Nectar Thick Liquid: Not tested   Honey Thick Honey Thick Liquid: Not tested   Puree Puree: Within functional limits Presentation: Self Fed;Spoon   Solid   GO   Solid:  Impaired Oral Phase Impairments: Reduced lingual movement/coordination Oral Phase Functional Implications: Impaired mastication Other Comments: slow "mastication", pt does not have dentures at hospital and reports he normally wears top ones        Luanna Salk, [facility] [phone]

## 2015-06-28 NOTE — Progress Notes (Signed)
16 fr foley placed per MD order/protocol d/t  periop . Pt tolerated procedure well Consuela Mimes, RN present as second person during insertion. Peri care care with soap and water provided prior to insertion and sterile procedure maintained throughout. Will continue to monitor.

## 2015-06-28 NOTE — Progress Notes (Signed)
Attempted to contact [place] x2 to update on surgery schedule tomorrow 06/29/15. Patient aware. Will continue to monitor.

## 2015-06-29 ENCOUNTER — Inpatient Hospital Stay (HOSPITAL_COMMUNITY): Payer: Medicare Other

## 2015-06-29 ENCOUNTER — Inpatient Hospital Stay (HOSPITAL_COMMUNITY): Payer: Medicare Other | Admitting: Certified Registered"

## 2015-06-29 ENCOUNTER — Encounter (HOSPITAL_COMMUNITY)
Admission: EM | Disposition: A | Discharge status: Skilled Nursing Facility | Payer: Self-pay | Source: Home / Self Care | Attending: Internal Medicine

## 2015-06-29 ENCOUNTER — Encounter (HOSPITAL_COMMUNITY): Payer: Self-pay | Admitting: Anesthesiology

## 2015-06-29 DIAGNOSIS — I824Y1 Acute embolism and thrombosis of unspecified deep veins of right proximal lower extremity: Secondary | ICD-10-CM

## 2015-06-29 HISTORY — PX: ORIF PERIPROSTHETIC FRACTURE: SHX5034

## 2015-06-29 SURGERY — OPEN REDUCTION INTERNAL FIXATION (ORIF) PERIPROSTHETIC FRACTURE
Anesthesia: General | Laterality: Right

## 2015-06-29 MED ORDER — SODIUM CHLORIDE 0.9 % IV SOLN
INTRAVENOUS | Status: DC
  Administered 2015-06-30: 05:00:00 via INTRAVENOUS

## 2015-06-29 MED ORDER — NEOSTIGMINE METHYLSULFATE 10 MG/10ML IV SOLN
INTRAVENOUS | Status: DC | PRN
  Administered 2015-06-29: 3 mg via INTRAVENOUS

## 2015-06-29 MED ORDER — ONDANSETRON HCL 4 MG/2ML IJ SOLN
INTRAMUSCULAR | Status: DC | PRN
  Administered 2015-06-29: 4 mg via INTRAVENOUS

## 2015-06-29 MED ORDER — OXYCODONE HCL 5 MG/5ML PO SOLN: 5.0000 mg | Freq: Once | ORAL | Status: DC | PRN

## 2015-06-29 MED ORDER — ALUM & MAG HYDROXIDE-SIMETH 200-200-20 MG/5ML PO SUSP: 30.0000 mL | ORAL | Status: DC | PRN

## 2015-06-29 MED ORDER — LACTATED RINGERS IV SOLN
INTRAVENOUS | Status: DC
  Administered 2015-06-29 (×2): via INTRAVENOUS

## 2015-06-29 MED ORDER — ACETAMINOPHEN 325 MG PO TABS: 650.0000 mg | ORAL_TABLET | Freq: Four times a day (QID) | ORAL | Status: DC | PRN

## 2015-06-29 MED ORDER — ACETAMINOPHEN 650 MG RE SUPP
650.0000 mg | Freq: Four times a day (QID) | RECTAL | Status: DC | PRN
Start: 2015-06-29 — End: 2015-07-01

## 2015-06-29 MED ORDER — LIDOCAINE HCL (CARDIAC) 20 MG/ML IV SOLN
INTRAVENOUS | Status: DC | PRN
  Administered 2015-06-29: 60 mg via INTRAVENOUS

## 2015-06-29 MED ORDER — FENTANYL CITRATE (PF) 100 MCG/2ML IJ SOLN
INTRAMUSCULAR | Status: DC | PRN
  Administered 2015-06-29: 50 ug via INTRAVENOUS
  Administered 2015-06-29: 100 ug via INTRAVENOUS
  Administered 2015-06-29: 25 ug via INTRAVENOUS
  Administered 2015-06-29: 50 ug via INTRAVENOUS
  Administered 2015-06-29: 25 ug via INTRAVENOUS

## 2015-06-29 MED ORDER — SUCCINYLCHOLINE CHLORIDE 20 MG/ML IJ SOLN
INTRAMUSCULAR | Status: DC | PRN
  Administered 2015-06-29: 60 mg via INTRAVENOUS

## 2015-06-29 MED ORDER — FERROUS SULFATE 325 (65 FE) MG PO TABS
325.0000 mg | ORAL_TABLET | Freq: Every day | ORAL | Status: DC
  Administered 2015-06-30 – 2015-07-03 (×4): 325 mg via ORAL
  Filled 2015-06-29 (×4): qty 1

## 2015-06-29 MED ORDER — CEFAZOLIN SODIUM-DEXTROSE 2-3 GM-% IV SOLR
INTRAVENOUS | Status: AC
  Filled 2015-06-29: qty 50

## 2015-06-29 MED ORDER — ONDANSETRON HCL 4 MG/2ML IJ SOLN: 4.0000 mg | Freq: Four times a day (QID) | INTRAMUSCULAR | Status: DC | PRN

## 2015-06-29 MED ORDER — ENOXAPARIN SODIUM 40 MG/0.4ML ~~LOC~~ SOLN: 40.0000 mg | Freq: Every day | SUBCUTANEOUS | Status: DC

## 2015-06-29 MED ORDER — POTASSIUM PHOSPHATE MONOBASIC 500 MG PO TABS
1250.0000 mg | ORAL_TABLET | Freq: Every day | ORAL | Status: DC
  Filled 2015-06-29: qty 3

## 2015-06-29 MED ORDER — ACETAMINOPHEN 160 MG/5ML PO SOLN: 325.0000 mg | ORAL | Status: DC | PRN

## 2015-06-29 MED ORDER — PROPOFOL 10 MG/ML IV BOLUS
INTRAVENOUS | Status: DC | PRN
  Administered 2015-06-29: 50 mg via INTRAVENOUS

## 2015-06-29 MED ORDER — OXYCODONE HCL 5 MG PO TABS: 5.0000 mg | ORAL_TABLET | ORAL | Status: DC | PRN

## 2015-06-29 MED ORDER — AMOXICILLIN 500 MG PO TABS: 500.0000 mg | ORAL_TABLET | Freq: Two times a day (BID) | ORAL | Status: DC

## 2015-06-29 MED ORDER — FENTANYL CITRATE (PF) 250 MCG/5ML IJ SOLN
INTRAMUSCULAR | Status: AC
  Filled 2015-06-29: qty 5

## 2015-06-29 MED ORDER — SULFAMETHOXAZOLE-TMP DS 800-160 MG PO TABS: 1.0000 | ORAL_TABLET | Freq: Two times a day (BID) | ORAL | Status: DC

## 2015-06-29 MED ORDER — K PHOS MONO-SOD PHOS DI & MONO 155-852-130 MG PO TABS
625.0000 mg | ORAL_TABLET | Freq: Two times a day (BID) | ORAL | Status: DC
  Filled 2015-06-29 (×2): qty 2.5

## 2015-06-29 MED ORDER — MENTHOL 3 MG MT LOZG: 1.0000 | LOZENGE | OROMUCOSAL | Status: DC | PRN

## 2015-06-29 MED ORDER — PHENOL 1.4 % MT LIQD: 1.0000 | OROMUCOSAL | Status: DC | PRN

## 2015-06-29 MED ORDER — METOCLOPRAMIDE HCL 5 MG/ML IJ SOLN: 5.0000 mg | Freq: Three times a day (TID) | INTRAMUSCULAR | Status: DC | PRN

## 2015-06-29 MED ORDER — ROCURONIUM BROMIDE 100 MG/10ML IV SOLN
INTRAVENOUS | Status: DC | PRN
  Administered 2015-06-29: 30 mg via INTRAVENOUS

## 2015-06-29 MED ORDER — TAMSULOSIN HCL 0.4 MG PO CAPS
0.4000 mg | ORAL_CAPSULE | Freq: Every day | ORAL | Status: DC
  Administered 2015-06-29 – 2015-06-30 (×2): 0.4 mg via ORAL
  Filled 2015-06-29 (×3): qty 1

## 2015-06-29 MED ORDER — PHENYLEPHRINE HCL 10 MG/ML IJ SOLN
INTRAMUSCULAR | Status: DC | PRN
  Administered 2015-06-29 (×2): 80 ug via INTRAVENOUS

## 2015-06-29 MED ORDER — OXYCODONE HCL 5 MG PO TABS: 5.0000 mg | ORAL_TABLET | Freq: Once | ORAL | Status: DC | PRN

## 2015-06-29 MED ORDER — METOPROLOL SUCCINATE ER 50 MG PO TB24: 75.0000 mg | ORAL_TABLET | Freq: Every day | ORAL | Status: DC

## 2015-06-29 MED ORDER — ARTIFICIAL TEARS OP OINT
TOPICAL_OINTMENT | OPHTHALMIC | Status: AC
  Filled 2015-06-29: qty 3.5

## 2015-06-29 MED ORDER — MORPHINE SULFATE (PF) 2 MG/ML IV SOLN: 0.5000 mg | INTRAVENOUS | Status: DC | PRN

## 2015-06-29 MED ORDER — FINASTERIDE 5 MG PO TABS
5.0000 mg | ORAL_TABLET | Freq: Every day | ORAL | Status: DC
  Administered 2015-06-29 – 2015-07-03 (×5): 5 mg via ORAL
  Filled 2015-06-29 (×6): qty 1

## 2015-06-29 MED ORDER — ONDANSETRON HCL 4 MG PO TABS: 4.0000 mg | ORAL_TABLET | Freq: Four times a day (QID) | ORAL | Status: DC | PRN

## 2015-06-29 MED ORDER — ONDANSETRON HCL 4 MG/2ML IJ SOLN
INTRAMUSCULAR | Status: AC
  Filled 2015-06-29: qty 2

## 2015-06-29 MED ORDER — METOCLOPRAMIDE HCL 5 MG PO TABS: 5.0000 mg | ORAL_TABLET | Freq: Three times a day (TID) | ORAL | Status: DC | PRN

## 2015-06-29 MED ORDER — DILTIAZEM HCL 100 MG IV SOLR
5.0000 mg/h | INTRAVENOUS | Status: DC
  Administered 2015-06-29: 0 mg via INTRAVENOUS
  Administered 2015-06-30: 5 mg/h via INTRAVENOUS
  Filled 2015-06-29 (×2): qty 100

## 2015-06-29 MED ORDER — VITAMIN B-12 100 MCG PO TABS
100.0000 ug | ORAL_TABLET | Freq: Every day | ORAL | Status: DC
  Administered 2015-06-29 – 2015-07-03 (×4): 100 ug via ORAL
  Filled 2015-06-29 (×5): qty 1

## 2015-06-29 MED ORDER — PROPOFOL 10 MG/ML IV BOLUS
INTRAVENOUS | Status: AC
  Filled 2015-06-29: qty 20

## 2015-06-29 MED ORDER — ENOXAPARIN SODIUM 40 MG/0.4ML ~~LOC~~ SOLN: 40.0000 mg | SUBCUTANEOUS | Status: DC

## 2015-06-29 MED ORDER — DILTIAZEM LOAD VIA INFUSION
15.0000 mg | Freq: Once | INTRAVENOUS | Status: AC
  Administered 2015-06-29: 15 mg via INTRAVENOUS
  Filled 2015-06-29: qty 15

## 2015-06-29 MED ORDER — LIDOCAINE HCL (CARDIAC) 20 MG/ML IV SOLN
INTRAVENOUS | Status: AC
  Filled 2015-06-29: qty 5

## 2015-06-29 MED ORDER — ACETAMINOPHEN 325 MG PO TABS: 325.0000 mg | ORAL_TABLET | ORAL | Status: DC | PRN

## 2015-06-29 MED ORDER — PHENYLEPHRINE HCL 10 MG/ML IJ SOLN
10.0000 mg | INTRAVENOUS | Status: DC | PRN
  Administered 2015-06-29: 25 ug/min via INTRAVENOUS

## 2015-06-29 MED ORDER — HYDROCODONE-ACETAMINOPHEN 7.5-325 MG PO TABS: 1.0000 | ORAL_TABLET | Freq: Four times a day (QID) | ORAL | Status: DC | PRN

## 2015-06-29 MED ORDER — CEFAZOLIN SODIUM-DEXTROSE 2-3 GM-% IV SOLR
2.0000 g | Freq: Four times a day (QID) | INTRAVENOUS | Status: AC
Start: 2015-06-29 — End: 2015-06-30
  Administered 2015-06-29 – 2015-06-30 (×3): 2 g via INTRAVENOUS
  Filled 2015-06-29 (×3): qty 50

## 2015-06-29 MED ORDER — DABIGATRAN ETEXILATE MESYLATE 150 MG PO CAPS: 150.0000 mg | ORAL_CAPSULE | Freq: Two times a day (BID) | ORAL | Status: DC

## 2015-06-29 MED ORDER — GLYCOPYRROLATE 0.2 MG/ML IJ SOLN
INTRAMUSCULAR | Status: DC | PRN
  Administered 2015-06-29: 0.4 mg via INTRAVENOUS

## 2015-06-29 MED ORDER — SODIUM CHLORIDE 0.9 % IR SOLN
Status: DC | PRN
  Administered 2015-06-29: 1000 mL

## 2015-06-29 MED ORDER — FENTANYL CITRATE (PF) 100 MCG/2ML IJ SOLN: 25.0000 ug | INTRAMUSCULAR | Status: DC | PRN

## 2015-06-29 MED ORDER — VITAMIN D (ERGOCALCIFEROL) 1.25 MG (50000 UNIT) PO CAPS: 50000.0000 [IU] | ORAL_CAPSULE | ORAL | Status: DC

## 2015-06-29 MED ORDER — HYDROCODONE-ACETAMINOPHEN 5-325 MG PO TABS
1.0000 | ORAL_TABLET | Freq: Four times a day (QID) | ORAL | Status: DC | PRN
  Filled 2015-06-29: qty 1

## 2015-06-29 SURGICAL SUPPLY — 55 items
ALCOHOL 70% 16 OZ (MISCELLANEOUS) ×2 IMPLANT
BRUSH FEMORAL CANAL (MISCELLANEOUS) IMPLANT
CABLE CERLAGE W/CRIMP 1.8 (Cable) ×2 IMPLANT
COVER BACK TABLE 24X17X13 BIG (DRAPES) IMPLANT
COVER SURGICAL LIGHT HANDLE (MISCELLANEOUS) ×4 IMPLANT
DRAPE IMP U-DRAPE 54X76 (DRAPES) ×2 IMPLANT
DRAPE ORTHO SPLIT 77X108 STRL (DRAPES) ×2
DRAPE PROXIMA HALF (DRAPES) ×2 IMPLANT
DRAPE SURG ORHT 6 SPLT 77X108 (DRAPES) ×2 IMPLANT
DRAPE U-SHAPE 47X51 STRL (DRAPES) ×2 IMPLANT
DRAPE U-SHAPE 76X120 STRL (DRAPES) ×2 IMPLANT
DRSG AQUACEL AG ADV 3.5X10 (GAUZE/BANDAGES/DRESSINGS) ×2 IMPLANT
DRSG AQUACEL AG ADV 3.5X14 (GAUZE/BANDAGES/DRESSINGS) ×2 IMPLANT
DRSG MEPILEX BORDER 4X12 (GAUZE/BANDAGES/DRESSINGS) IMPLANT
DRSG MEPILEX BORDER 4X8 (GAUZE/BANDAGES/DRESSINGS) IMPLANT
DURAPREP 26ML APPLICATOR (WOUND CARE) ×4 IMPLANT
ELECT BLADE 6.5 EXT (BLADE) IMPLANT
ELECT CAUTERY BLADE 6.4 (BLADE) ×2 IMPLANT
ELECT REM PT RETURN 9FT ADLT (ELECTROSURGICAL) ×2
ELECTRODE REM PT RTRN 9FT ADLT (ELECTROSURGICAL) ×1 IMPLANT
GLOVE PROGUARD SZ 7 1/2 (GLOVE) ×2 IMPLANT
GLOVE SKINSENSE NS SZ7.5 (GLOVE) ×1
GLOVE SKINSENSE STRL SZ7.5 (GLOVE) ×1 IMPLANT
GOWN STRL REUS W/ TWL XL LVL3 (GOWN DISPOSABLE) ×2 IMPLANT
GOWN STRL REUS W/TWL XL LVL3 (GOWN DISPOSABLE) ×2
GTR EXTENDED 5 HOLE 4 CABLES (Orthopedic Implant) ×2 IMPLANT
HANDPIECE INTERPULSE COAX TIP (DISPOSABLE)
KIT BASIN OR (CUSTOM PROCEDURE TRAY) ×2 IMPLANT
KIT ROOM TURNOVER OR (KITS) ×2 IMPLANT
MANIFOLD NEPTUNE II (INSTRUMENTS) ×2 IMPLANT
NS IRRIG 1000ML POUR BTL (IV SOLUTION) ×4 IMPLANT
PACK TOTAL JOINT (CUSTOM PROCEDURE TRAY) ×2 IMPLANT
PAD ARMBOARD 7.5X6 YLW CONV (MISCELLANEOUS) ×4 IMPLANT
SCREW NLOCK CORT 4.5X42 (Screw) ×2 IMPLANT
SCREW NLOCK CORT 4.5X44 (Screw) ×2 IMPLANT
SEALER BIPOLAR AQUA 6.0 (INSTRUMENTS) ×2 IMPLANT
SET HNDPC FAN SPRY TIP SCT (DISPOSABLE) IMPLANT
STAPLER VISISTAT 35W (STAPLE) ×2 IMPLANT
SUT ETHIBOND NAB CT1 #1 30IN (SUTURE) ×2 IMPLANT
SUT ETHILON 2 0 PSLX (SUTURE) ×2 IMPLANT
SUT PDS AB 1 CT  36 (SUTURE) ×3
SUT PDS AB 1 CT 36 (SUTURE) ×3 IMPLANT
SUT VIC AB 0 CT1 27 (SUTURE) ×2
SUT VIC AB 0 CT1 27XBRD ANBCTR (SUTURE) ×2 IMPLANT
SUT VIC AB 1 CT1 27 (SUTURE) ×2
SUT VIC AB 1 CT1 27XBRD ANBCTR (SUTURE) ×2 IMPLANT
SUT VIC AB 1 CTX 36 (SUTURE) ×1
SUT VIC AB 1 CTX36XBRD ANBCTR (SUTURE) ×1 IMPLANT
SUT VIC AB 2-0 CT1 27 (SUTURE) ×1
SUT VIC AB 2-0 CT1 TAPERPNT 27 (SUTURE) ×1 IMPLANT
SUT VIC AB 2-0 CTB1 (SUTURE) ×4 IMPLANT
TOWEL OR 17X24 6PK STRL BLUE (TOWEL DISPOSABLE) ×2 IMPLANT
TOWEL OR 17X26 10 PK STRL BLUE (TOWEL DISPOSABLE) ×2 IMPLANT
TRAY FOLEY CATH 16FRSI W/METER (SET/KITS/TRAYS/PACK) IMPLANT
WATER STERILE IRR 1000ML POUR (IV SOLUTION) ×6 IMPLANT

## 2015-06-29 NOTE — OR Nursing (Signed)
Pt arrived from OR with Afib with RVR. Attempted to have pt couph and bear down with no change in RVR. Anesthesia and attending MD at bs and orders recvd for Cardizem bolus and gtt. Pt VR slowed immediately with bolus and maintained after bolus with gtt.

## 2015-06-29 NOTE — Progress Notes (Signed)
Informed by RN that patient now tachycardic. Tele monitor-suspect Afib RVR BP stable. Patient still lethargic and awakening from anesthesia Plan Cardizem gtt 12 lead EKG Will notify cardiology Transfer to SDU.

## 2015-06-29 NOTE — Progress Notes (Signed)
Echo with normal LV function. Will sign off. Please call with questions.   MCALHANY,CHRISTOPHER 06/29/2015 10:58 AM

## 2015-06-29 NOTE — Anesthesia Preprocedure Evaluation (Addendum)
Anesthesia Evaluation  Patient identified by MRN, date of birth, ID band Patient awake    Reviewed: Allergy & Precautions, NPO status , Patient's Chart, lab work & pertinent test results, reviewed documented beta blocker date and time   History of Anesthesia Complications Negative for: history of anesthetic complications  Airway Mallampati: II  TM Distance: >3 FB Neck ROM: Full    Dental  (+) Edentulous Upper, Edentulous Lower   Pulmonary COPD,    breath sounds clear to auscultation       Cardiovascular + Peripheral Vascular Disease and +CHF  + dysrhythmias Atrial Fibrillation  Rhythm:Irregular     Neuro/Psych negative neurological ROS  negative psych ROS   GI/Hepatic Neg liver ROS, PUD,   Endo/Other  negative endocrine ROS  Renal/GU negative Renal ROS     Musculoskeletal  (+) Arthritis ,   Abdominal   Peds  Hematology  (+) anemia ,   Anesthesia Other Findings   Reproductive/Obstetrics negative OB ROS                            Anesthesia Physical Anesthesia Plan  ASA: III  Anesthesia Plan: General   Post-op Pain Management:    Induction: Intravenous  Airway Management Planned: Oral ETT  Additional Equipment: None  Intra-op Plan:   Post-operative Plan: Extubation in OR  Informed Consent: I have reviewed the patients History and Physical, chart, labs and discussed the procedure including the risks, benefits and alternatives for the proposed anesthesia with the patient or authorized representative who has indicated his/her understanding and acceptance.   Dental advisory given  Plan Discussed with: CRNA and Surgeon  Anesthesia Plan Comments:         Anesthesia Quick Evaluation

## 2015-06-29 NOTE — Anesthesia Procedure Notes (Signed)
Procedure Name: Intubation Date/Time: 06/29/2015 3:36 PM Performed by: Jenne Campus Pre-anesthesia Checklist: Patient identified, Emergency Drugs available, Suction available, Patient being monitored and Timeout performed Patient Re-evaluated:Patient Re-evaluated prior to inductionOxygen Delivery Method: Circle system utilized Preoxygenation: Pre-oxygenation with 100% oxygen Intubation Type: IV induction Ventilation: Mask ventilation without difficulty, Oral airway inserted - appropriate to patient size and Two handed mask ventilation required Laryngoscope Size: Miller and 3 Grade View: Grade I Tube type: Oral Tube size: 7.5 mm Number of attempts: 1 Airway Equipment and Method: Stylet Placement Confirmation: ETT inserted through vocal cords under direct vision,  positive ETCO2,  CO2 detector and breath sounds checked- equal and bilateral Secured at: 23 cm Tube secured with: Tape Dental Injury: Teeth and Oropharynx as per pre-operative assessment

## 2015-06-29 NOTE — Op Note (Signed)
   Date of Surgery: 06/29/2015  INDICATIONS: Mr. Tayman is a 82 y.o.-year-old male with a right periprosthetic hip fracture from a fall;  The patient and family did consent to the procedure after discussion of the risks and benefits.  PREOPERATIVE DIAGNOSIS: Right periprosthetic hip fracture  POSTOPERATIVE DIAGNOSIS: Same.  PROCEDURE: Open treatment internal fixation of right periprosthetic hip fracture  SURGEON: N. Eduard Roux, M.D.  ASSIST: April Green, RNFA.  ANESTHESIA:  general  IV FLUIDS AND URINE: See anesthesia.  ESTIMATED BLOOD LOSS: 150  mL.  IMPLANTS: Zimmer Trochanteric cable plate 260 mm  DRAINS: none  COMPLICATIONS: None.  DESCRIPTION OF PROCEDURE: The patient was brought to the operating room and placed supine on the operating table.  The patient had been signed prior to the procedure and this was documented. The patient had the anesthesia placed by the anesthesiologist.  A time-out was performed to confirm that this was the correct patient, site, side and location. The patient did receive antibiotics prior to the incision and was re-dosed during the procedure as needed at indicated intervals.  A tourniquet not placed.  The patient had the operative extremity prepped and draped in the standard surgical fashion.    A direct lateral incision was placed over the proximal femur and the femoral shaft. Full-thickness flaps were created. The IT band was sharply incised. The vastus was then elevated off of the lateral aspect of the femur. Perforating arteries were cauterized. The abductor tendon was slightly peeled off of the lateral trochanter to accommodate the proximal portion of the plate. This was later repaired with #1 Ethibond. We then obtained a reduction of the fracture and held it in place with a large bone clamp. We then placed a 260 mm trochanteric cable plate at the appropriate depth and confirmed on fluoroscopy. We then sequentially passed the cables around the fracture  and the femur and these were tightened. Fluoroscopy was used to confirm adequate reduction and cable placement. We were able to place 2 bicortical nonlocking screws distal to the femoral stem. Final x-rays were taken. The wound was thoroughly irrigated. Hemostasis was obtained. Wound was closed in a layered fashion. Sterile dressings were applied. Patient tolerated procedure well and had no immediate complications.  POSTOPERATIVE PLAN: Patient will be touchdown weightbearing to the right lower extremity.  Azucena Cecil, MD [facility] [phone] 5:06 PM

## 2015-06-29 NOTE — Anesthesia Postprocedure Evaluation (Signed)
Anesthesia Post Note  Patient: Gerald Sandoval  Procedure(s) Performed: Procedure(s) (LRB): OPEN REDUCTION INTERNAL FIXATION (ORIF) RIGHT HIP PERIPROSTHETIC FRACTURE (Right)  Patient location during evaluation: PACU Anesthesia Type: General Level of consciousness: sedated Pain management: pain level controlled Vital Signs Assessment: post-procedure vital signs reviewed and stable Respiratory status: spontaneous breathing and respiratory function stable Cardiovascular status: stable Anesthetic complications: no Comments: Afib became rapid in PACU, but resolved with Cardizem.    Last Vitals:  Filed Vitals:   06/29/15 1900 06/29/15 1915  BP: 139/88 140/77  Pulse: 97 91  Temp:    Resp: 21 17    Last Pain:  Filed Vitals:   06/29/15 1921  PainSc: 0-No pain                 Stephen Baruch,Fed DANIEL

## 2015-06-29 NOTE — Progress Notes (Signed)
PATIENT DETAILS Name: Gerald Sandoval Age: 82 y.o. 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 He was found to have sepsis of unknown etiology, acute renal failure, and a right periprosthetic hip fracture. He was managed in the stepdown unit, was placed on empiric antibiotics, IV fluids, orthopedics was consulted. Although sepsis pathophysiology has resolved, Hospital course has been complicated by development of worsening anemia, right lower extremity DVT requiring discontinuation of anticoagulation and placement of IVC filter. Patient has been seen by cardiology and deemed to be a high risk candidate for orthopedic surgery, patient understands risks and is willing to proceed with surgery. Please see below for further details  Subjective: Hard of hearing-but denies SOB/Chest pain.  Assessment/Plan: Severe sepsis: Etiology unknown, however sepsis pathophysiology has resolved. Blood/urine cultures negative. Discontinued vancomycin on 2/1, continue Zosyn-suspect we could narrow antibiotic spectrum further postoperatively and transitioning back to amoxicillin (that he takes chronically for right hip infection).  Right hip periprosthetic fracture: Following a mechanical fall, orthopedics following-plans are OR on 2/2. Cardiology following for preoperative risk stratification-deemed to be a high risk-but okay to proceed. Patient accepts risks, and is willing to proceed with surgery.   Acute on chronic anemia: Suspect acute anemia multifactorial from chronic blood loss, acute illness and renal failure. No overt evidence of GI  bleed (FOBT negative this admission) or evidence of retroperitoneal hematoma on CT scan. Status post PRBC transfusion, hemoglobin currently stable-Follow.Pradaxa has been discontinued.  Acute renal failure: Resolved. Likely secondary to prerenal azotemia in the setting of sepsis. .  Right lower extremity DVT: Difficult situation-significant anemia with suspicion for ongoing chronic blood loss-poor candidate for long-term anticoagulation. IVC filter placed 1/31.  History of atrial fibrillation: Rate controlled-he was on Pradaxa as an outpatient but on hold for procedure-not sure with anemia, history of fall-is a great long-term anticoagulation candidate. Echo shows EF around 50-55%.  We will need to address this as we move forward.  History of infected prosthetic left hip: On chronic suppressive therapy with amoxicillin-no indication of abscess in CT abdomen/pelvis. Suspect could be transitioned to oral amoxicillin in the next few days.  Thrombocytopenia:? Etiology-but suspect secondary to sepsis, platelet consumption DVT. Since mild-would just follow for now. Discontinued SQ heparin 2/1-follow CBC.If drops further-will need to send HIT panel.  Minimally elevated troponin: Trend not consistent with ACS. Supportive care. Echo shows preserved ejection fraction  Mild rhabdomyolysis: Likely secondary to be down prior to admission. CK downtrending with supportive care.  Disposition: Remain inpatient-monitor in SDU post-operatively  Antimicrobial agents  See below  Anti-infectives    Start     Dose/Rate Route Frequency Ordered Stop   06/29/15 1500  ceFAZolin (ANCEF) IVPB 2 g/50 mL premix    Comments:  Anesthesia to give preop   2 g 100 mL/hr over 30 Minutes Intravenous To [facility] 06/28/15 0727 06/30/15 1500   06/28/15 0700  vancomycin (VANCOCIN) IVPB 1000 mg/200 mL premix  Status:  Discontinued     1,000 mg 200 mL/hr over 60 Minutes Intravenous Every 24 hours 06/27/15 1253 06/28/15  1150   06/27/15 1000  vancomycin (VANCOCIN) IVPB 1000  mg/200 mL premix  Status:  Discontinued     1,000 mg 200 mL/hr over 60 Minutes Intravenous Every 48 hours 06/25/15 1358 06/25/15 1633   06/26/15 0930  vancomycin (VANCOCIN) IVPB 750 mg/150 ml premix  Status:  Discontinued     750 mg 150 mL/hr over 60 Minutes Intravenous Every 24 hours 06/25/15 1633 06/27/15 1253   06/25/15 1730  piperacillin-tazobactam (ZOSYN) IVPB 3.375 g     3.375 g 12.5 mL/hr over 240 Minutes Intravenous 3 times per day 06/25/15 1633     06/25/15 1700  piperacillin-tazobactam (ZOSYN) IVPB 2.25 g  Status:  Discontinued     2.25 g 100 mL/hr over 30 Minutes Intravenous Every 8 hours 06/25/15 1358 06/25/15 1633   06/25/15 0930  vancomycin (VANCOCIN) 1,500 mg in sodium chloride 0.9 % 500 mL IVPB     1,500 mg 250 mL/hr over 120 Minutes Intravenous  Once 06/25/15 0849 06/25/15 1249   06/25/15 0900  piperacillin-tazobactam (ZOSYN) IVPB 3.375 g     3.375 g 100 mL/hr over 30 Minutes Intravenous  Once 06/25/15 0849 06/25/15 0930      DVT Prophylaxis: None-no heparin for worsening thrombocytopenia, no SCD's due to DVT   Code Status:  DNR  Family Communication None  Procedures: 1/31>>IVC filter  CONSULTS:  cardiology, orthopedic surgery and IR and palliative care  Time spent 25 minutes-Greater than 50% of this time was spent in counseling, explanation of diagnosis, planning of further management, and coordination of care.  MEDICATIONS: Scheduled Meds: . sodium chloride   Intravenous Once  .  ceFAZolin (ANCEF) IV  2 g Intravenous To SS-Surg  . metoprolol tartrate  25 mg Oral BID  . piperacillin-tazobactam (ZOSYN)  IV  3.375 g Intravenous 3 times per day  . senna-docusate  1 tablet Oral QHS  . sodium chloride flush  3 mL Intravenous Q12H   Continuous Infusions:   PRN Meds:.acetaminophen **OR** acetaminophen, albuterol, HYDROcodone-acetaminophen, ondansetron **OR** ondansetron (ZOFRAN) IV, sodium  chloride    PHYSICAL EXAM: Vital signs in last 24 hours: Filed Vitals:   06/28/15 2017 06/28/15 2330 06/29/15 0415 06/29/15 0755  BP: 127/80 145/87 150/89 144/79  Pulse: 74 86 71 81  Temp: 98.1 F (36.7 C) 98.3 F (36.8 C) 97.8 F (36.6 C) 98.3 F (36.8 C)  TempSrc: Oral Oral Oral Oral  Resp: 23 18 18 18   Height:      Weight:      SpO2: 96% 96% 97% 94%    Weight change:  Filed Weights   06/25/15 1450  Weight: 69.5 kg (153 lb 3.5 oz)   Body mass index is 23.3 kg/(m^2).   Gen Exam: Awake and alert with clear speech.   Neck: Supple, No JVD.   Chest: B/L Clear.   CVS: S1 S2 Regular, no murmurs. Abdomen: soft, BS +, non tender, non distended.  Extremities: no edema, lower extremities warm to touch. Neurologic: Non Focal.   Skin: No Rash Wounds: N/A.    Intake/Output from previous day:  Intake/Output Summary (Last 24 hours) at 06/29/15 1003 Last data filed at 06/29/15 0800  Gross per 24 hour  Intake    170 ml  Output   3175 ml  Net  -3005 ml     LAB RESULTS: CBC  Recent Labs Lab 06/25/15 0832  06/25/15 1600  06/26/15 0328 06/26/15 0648 06/26/15 1838 06/27/15 0432 06/28/15 0630  WBC 23.8*  < > 22.5*  < > 25.7* 24.3* 20.6* 16.3* 10.7*  HGB 9.3*  < > 8.1*  < >  6.8* 6.5* 8.3* 8.4* 11.2*  HCT 27.1*  < > 22.6*  < > 19.0* 17.8* 23.4* 24.0* 30.9*  PLT 141*  < > 123*  < > 104* 95* 95* 98* 82*  MCV 98.9  < > 95.4  < > 95.0 94.7 91.8 92.0 90.9  MCH 33.9  < > 34.2*  < > 34.0 34.6* 32.5 32.2 32.9  MCHC 34.3  < > 35.8  < > 35.8 36.5* 35.5 35.0 36.2*  RDW 13.5  < > 13.2  < > 13.7 13.6 15.9* 16.5* 16.6*  LYMPHSABS 1.0  --  0.8  --   --   --   --   --   --   MONOABS 0.8  --  1.4*  --   --   --   --   --   --   EOSABS 0.0  --  0.0  --   --   --   --   --   --   BASOSABS 0.0  --  0.0  --   --   --   --   --   --   < > = values in this interval not displayed.  Chemistries   Recent Labs Lab 06/26/15 0648 06/26/15 1837 06/27/15 0432 06/27/15 0802 06/28/15 0630   NA 130* 136 134* 132* 133*  K 3.9 3.8 3.3* 3.3* 4.2  CL 102 103 104 103 101  CO2 23 24 22 22  19*  GLUCOSE 123* 116* 94 95 89  BUN 15 15 12 12 12   CREATININE 1.36* 1.19 0.99 1.00 0.98  CALCIUM 7.5* 7.7* 7.7* 7.5* 8.0*    CBG:  Recent Labs Lab 06/25/15 0850  GLUCAP 236*    GFR Estimated Creatinine Clearance: 49.4 mL/min (by C-G formula based on Cr of 0.98).  Coagulation profile  Recent Labs Lab 06/25/15 1600  INR 1.97*    Cardiac Enzymes  Recent Labs Lab 06/25/15 0832 06/26/15 1837 06/26/15 2220  TROPONINI 0.04* 0.07* 0.09*    Invalid input(s): POCBNP No results for input(s): DDIMER in the last 72 hours. No results for input(s): HGBA1C in the last 72 hours. No results for input(s): CHOL, HDL, LDLCALC, TRIG, CHOLHDL, LDLDIRECT in the last 72 hours.  Recent Labs  06/26/15 1837  TSH 2.430   No results for input(s): VITAMINB12, FOLATE, FERRITIN, TIBC, IRON, RETICCTPCT in the last 72 hours. No results for input(s): LIPASE, AMYLASE in the last 72 hours.  Urine Studies No results for input(s): UHGB, CRYS in the last 72 hours.  Invalid input(s): UACOL, UAPR, USPG, UPH, UTP, UGL, UKET, UBIL, UNIT, UROB, ULEU, UEPI, UWBC, URBC, UBAC, CAST, UCOM, BILUA  MICROBIOLOGY: Recent Results (from the past 240 hour(s))  Culture, blood (routine x 2)     Status: None (Preliminary result)   Collection Time: 06/25/15  8:25 AM  Result Value Ref Range Status   Specimen Description BLOOD LEFT ANTECUBITAL  Final   Special Requests   Final    BOTTLES DRAWN AEROBIC AND ANAEROBIC 5CC ANA 6CC AER   Culture NO GROWTH 3 DAYS  Final   Report Status PENDING  Incomplete  Culture, blood (routine x 2)     Status: None (Preliminary result)   Collection Time: 06/25/15  8:30 AM  Result Value Ref Range Status   Specimen Description BLOOD RIGHT HAND  Final   Special Requests BOTTLES DRAWN AEROBIC ONLY 5CC  Final   Culture NO GROWTH 3 DAYS  Final   Report Status PENDING  Incomplete  Urine  culture     Status: None   Collection Time: 06/25/15  9:11 AM  Result Value Ref Range Status   Specimen Description URINE, CATHETERIZED  Final   Special Requests NONE  Final   Culture NO GROWTH 1 DAY  Final   Report Status 06/26/2015 FINAL  Final  MRSA PCR Screening     Status: None   Collection Time: 06/25/15  3:28 PM  Result Value Ref Range Status   MRSA by PCR NEGATIVE NEGATIVE Final    Comment:        The GeneXpert MRSA Assay (FDA approved for NASAL specimens only), is one component of a comprehensive MRSA colonization surveillance program. It is not intended to diagnose MRSA infection nor to guide or monitor treatment for MRSA infections.   Urine culture     Status: None   Collection Time: 06/27/15  1:50 AM  Result Value Ref Range Status   Specimen Description URINE, CATHETERIZED  Final   Special Requests NONE  Final   Culture NO GROWTH 1 DAY  Final   Report Status 06/28/2015 FINAL  Final    RADIOLOGY STUDIES/RESULTS: Ct Abdomen Pelvis Wo Contrast  06/26/2015  CLINICAL DATA:  Acute anemia. Evaluate for retroperitoneal bleed. Severe sepsis with acute renal failure. EXAM: CT ABDOMEN AND PELVIS WITHOUT CONTRAST TECHNIQUE: Multidetector CT imaging of the abdomen and pelvis was performed following the standard protocol without IV contrast. COMPARISON:  CT virtual colonoscopy 01/27/2015. Abdominal pelvic CT 11/01/2011. FINDINGS: Lower chest: There are new small dependent low-density pleural effusions bilaterally with associated patchy atelectasis at both lung bases. The heart remains enlarged. Hepatobiliary: As evaluated in the noncontrast state, the liver appears unremarkable. The gallbladder appears unremarkable. Previously noted biliary dilatation appears improved. Pancreas: Appears atrophied. No evidence of focal abnormality or surrounding inflammation. Spleen: Stable appearance with multiple small granulomas. Adrenals/Urinary Tract: Stable without suspicious findings. There is  stable subcapsular calcification posteriorly in the mid left kidney and a stable interpolar cyst in the right kidney, measuring up to 6.2 cm. No evidence of hydronephrosis. Stable tiny calculi in the lower pole of the left kidney. The bladder is partly obscured by artifact from the bilateral total hip arthroplasties, although demonstrates no abnormality. Stomach/Bowel: No evidence of bowel wall thickening, distention or surrounding inflammatory change. Vascular/Lymphatic: There are no enlarged abdominal or pelvic lymph nodes. There is diffuse atherosclerosis of the aorta, its branches and the iliac arteries. No evidence of retroperitoneal hematoma. Reproductive: Partly obscured by artifact from total hip arthroplasties. Grossly stable. Other: Mild subcutaneous edema within the abdominal wall bilaterally. Apparent asymmetric enlargement of the proximal right thigh without demonstrated focal hematoma. No evidence of hemoperitoneum. Musculoskeletal: No acute or significant osseous findings. Chronic appearing superior endplate compression deformities are noted at T10, T12 and L2. Previous bilateral total hip arthroplasty. IMPRESSION: 1. No evidence of retroperitoneal hematoma. There is mild asymmetric enlargement of the proximal right thigh without apparent hematoma. Correlate clinically. 2. Generalized soft tissue edema with new bilateral pleural effusions suspicious for anasarca. 3. Stable subcapsular calcification posteriorly in the left kidney and right renal cyst. No evidence of hydronephrosis or ureteral calculus. Electronically Signed   By: Richardean Sale M.D.   On: 06/26/2015 21:03   Ct Head Wo Contrast  06/25/2015  CLINICAL DATA:  Found on floor, unwitnessed fall. Head laceration. Altered mental status, confusion EXAM: CT HEAD WITHOUT CONTRAST CT CERVICAL SPINE WITHOUT CONTRAST TECHNIQUE: Multidetector CT imaging of the head and cervical spine was performed following the standard protocol  without  intravenous contrast. Multiplanar CT image reconstructions of the cervical spine were also generated. COMPARISON:  None. FINDINGS: CT HEAD FINDINGS There is atrophy and chronic small vessel disease changes. No acute intracranial abnormality. Specifically, no hemorrhage, hydrocephalus, mass lesion, acute infarction, or significant intracranial injury. No acute calvarial abnormality. Visualized paranasal sinuses and mastoids clear. Orbital soft tissues unremarkable. CT CERVICAL SPINE FINDINGS Diffuse degenerative facet disease throughout the cervical spine. Degenerative disc disease in the lower cervical spine. Prevertebral soft tissues are normal. Alignment is normal. No fracture. No epidural or paraspinal hematoma. IMPRESSION: No acute intracranial abnormality. Atrophy, chronic microvascular disease. Degenerative changes in the cervical spine. No acute bony abnormality. Electronically Signed   By: Rolm Baptise M.D.   On: 06/25/2015 09:58   Ct Cervical Spine Wo Contrast  06/25/2015  CLINICAL DATA:  Found on floor, unwitnessed fall. Head laceration. Altered mental status, confusion EXAM: CT HEAD WITHOUT CONTRAST CT CERVICAL SPINE WITHOUT CONTRAST TECHNIQUE: Multidetector CT imaging of the head and cervical spine was performed following the standard protocol without intravenous contrast. Multiplanar CT image reconstructions of the cervical spine were also generated. COMPARISON:  None. FINDINGS: CT HEAD FINDINGS There is atrophy and chronic small vessel disease changes. No acute intracranial abnormality. Specifically, no hemorrhage, hydrocephalus, mass lesion, acute infarction, or significant intracranial injury. No acute calvarial abnormality. Visualized paranasal sinuses and mastoids clear. Orbital soft tissues unremarkable. CT CERVICAL SPINE FINDINGS Diffuse degenerative facet disease throughout the cervical spine. Degenerative disc disease in the lower cervical spine. Prevertebral soft tissues are normal.  Alignment is normal. No fracture. No epidural or paraspinal hematoma. IMPRESSION: No acute intracranial abnormality. Atrophy, chronic microvascular disease. Degenerative changes in the cervical spine. No acute bony abnormality. Electronically Signed   By: Rolm Baptise M.D.   On: 06/25/2015 09:58   Ir Ivc Filter Plmt / S&i /img Guid/mod Sed  06/27/2015  CLINICAL DATA:  Right femur fracture and right lower extremity DVT. Prior to planned orthopedic surgery, request is been made to place an IVC filter. EXAM: 1. ULTRASOUND GUIDANCE FOR VASCULAR ACCESS OF THE RIGHT INTERNAL JUGULAR VEIN. 2. IVC VENOGRAM. 3. PERCUTANEOUS IVC FILTER PLACEMENT. ANESTHESIA/SEDATION: 50 mcg IV Fentanyl. Total Moderate Sedation Time 15 minutes. The patient's level of consciousness and physiologic status were continuously monitored during the procedure by Radiology nursing. CONTRAST:  88mL OMNIPAQUE IOHEXOL 300 MG/ML  SOLN FLUOROSCOPY TIME:  30 seconds. PROCEDURE: The procedure, risks, benefits, and alternatives were explained to the patient. Questions regarding the procedure were encouraged and answered. The patient understands and consents to the procedure. A time-out was performed prior to the procedure. The right neck was prepped with chlorhexidine in a sterile fashion, and a sterile drape was applied covering the operative field. A sterile gown and sterile gloves were used for the procedure. Local anesthesia was provided with 1% Lidocaine. Ultrasound was used to confirm patency of the right internal jugular vein. Under direct ultrasound guidance, a 21 gauge needle was advanced into the right internal jugular vein with ultrasound image documentation performed. After securing access with a micropuncture dilator, a guidewire was advanced into the inferior vena cava. A deployment sheath was advanced over the guidewire. This was utilized to perform IVC venography. The deployment sheath was further positioned in an appropriate location for  filter deployment. A Bard Denali IVC filter was then advanced in the sheath. This was then fully deployed in the infrarenal IVC. Final filter position was confirmed with a fluoroscopic spot image. Contrast injection was also performed through the  sheath under fluoroscopy to confirm patency of the IVC at the level of the filter. After the procedure the sheath was removed and hemostasis obtained with manual compression. COMPLICATIONS: None. FINDINGS: IVC venography demonstrates a normal caliber IVC with no evidence of thrombus. Renal veins are identified bilaterally. The IVC is tortuous. The IVC filter was successfully positioned below the level of the renal veins and is appropriately oriented. This IVC filter has both permanent and retrievable indications. IMPRESSION: Placement of percutaneous IVC filter in infrarenal IVC. IVC venogram shows no evidence of IVC thrombus and normal caliber of the inferior vena cava. This filter does have both permanent and retrievable indications. Electronically Signed   By: Aletta Edouard M.D.   On: 06/27/2015 17:41   Ct Femur Right Wo Contrast  06/25/2015  CLINICAL DATA:  Leg pain. Pacemaker precludes MRI. Clinical concern for compartment syndrome. EXAM: CT OF THE RIGHT FEMUR WITHOUT CONTRAST; CT OF THE RIGHT TIBIA FIBULA WITHOUT CONTRAST TECHNIQUE: Multidetector CT imaging was performed according to the standard protocol. Multiplanar CT image reconstructions were also generated. COMPARISON:  None. FINDINGS: CT right femur: Right total hip prosthesis observed with a periprosthetic fracture tracking from the greater trochanter longitudinal along the stem. There is an oblique component which tracks medially and is displaced 1.4 cm posteromedial from the rest of the shaft. The tip of this oblique proximal medial fragment is about at the level of the tip of the implant. However, the fracture appears to extend slightly further in a longitudinal fashion posteromedially as shown on  images 87 through 112 of series 1. Below this level the fracture plane is no longer well shown by CT. Bony demineralization is present. Common is requested for compartment syndrome. Assessment of the upper thigh is especially problematic due to the extensive streak artifact from the implant which can obscure the subtle intramuscular effacement of adipose tissue and other signs of compartment syndrome. I see no specific evidence of compartment syndrome but compartmental pressures would be a much more reliable check. Further distally and below the level of the implant, the visualized muscle compartments appear unusually hypertrophic/ well-developed for age, but still with marbling of adipose tissue in the muscles which would argue against a compartment syndrome. There is a small knee effusion along with some prepatellar edema. Diffuse atherosclerotic calcification is observed. CT right lower leg: No visualized tubular fibular fracture is observed; the distal most tip of the fibula is excluded. Diffuse atherosclerosis. There is subcutaneous edema tracking in the calf diffusely. Fatty marbling in the muscle tissues is present, slightly less notable in the tibialis anterior, but the other anterior compartmental muscles appear unremarkable and accordingly I am skeptical of compartment syndrome in the calf. IMPRESSION: 1. Periprosthetic fracture along the right hip implant, extending from the greater trochanter along the stem of the implant. This has a displaced component, with the medial fracture fragment about 1.4 cm posteromedial to the rest of the shaft at its distal tip. This distal tip is at about the level of the stem of the implant. There is also a longitudinal fracture component extending distally from the level of the tip of the implant within the shaft, which becomes indistinct several sent centimeters distally. 2. There is subcutaneous edema in the thigh and calf, but reasonably normal marbling of adipose  tissue in the muscles arguing against compartment syndrome. Clearly muscle compartment pressures are more sensitive and specific measure, and particularly in the upper thigh in the vicinity of the implant the streak artifact from the  metal further reduces diagnostic sensitivity. If there is continued clinical concern for compartment syndrome than compartment manometry would be recommended. 3. Small knee effusion. 4. No gas tracking in the soft tissues, or specific findings of high concern for abscess. Electronically Signed   By: Van Clines M.D.   On: 06/25/2015 14:29   Ct Tibia Fibula Right Wo Contrast  06/25/2015  CLINICAL DATA:  Leg pain. Pacemaker precludes MRI. Clinical concern for compartment syndrome. EXAM: CT OF THE RIGHT FEMUR WITHOUT CONTRAST; CT OF THE RIGHT TIBIA FIBULA WITHOUT CONTRAST TECHNIQUE: Multidetector CT imaging was performed according to the standard protocol. Multiplanar CT image reconstructions were also generated. COMPARISON:  None. FINDINGS: CT right femur: Right total hip prosthesis observed with a periprosthetic fracture tracking from the greater trochanter longitudinal along the stem. There is an oblique component which tracks medially and is displaced 1.4 cm posteromedial from the rest of the shaft. The tip of this oblique proximal medial fragment is about at the level of the tip of the implant. However, the fracture appears to extend slightly further in a longitudinal fashion posteromedially as shown on images 87 through 112 of series 1. Below this level the fracture plane is no longer well shown by CT. Bony demineralization is present. Common is requested for compartment syndrome. Assessment of the upper thigh is especially problematic due to the extensive streak artifact from the implant which can obscure the subtle intramuscular effacement of adipose tissue and other signs of compartment syndrome. I see no specific evidence of compartment syndrome but compartmental  pressures would be a much more reliable check. Further distally and below the level of the implant, the visualized muscle compartments appear unusually hypertrophic/ well-developed for age, but still with marbling of adipose tissue in the muscles which would argue against a compartment syndrome. There is a small knee effusion along with some prepatellar edema. Diffuse atherosclerotic calcification is observed. CT right lower leg: No visualized tubular fibular fracture is observed; the distal most tip of the fibula is excluded. Diffuse atherosclerosis. There is subcutaneous edema tracking in the calf diffusely. Fatty marbling in the muscle tissues is present, slightly less notable in the tibialis anterior, but the other anterior compartmental muscles appear unremarkable and accordingly I am skeptical of compartment syndrome in the calf. IMPRESSION: 1. Periprosthetic fracture along the right hip implant, extending from the greater trochanter along the stem of the implant. This has a displaced component, with the medial fracture fragment about 1.4 cm posteromedial to the rest of the shaft at its distal tip. This distal tip is at about the level of the stem of the implant. There is also a longitudinal fracture component extending distally from the level of the tip of the implant within the shaft, which becomes indistinct several sent centimeters distally. 2. There is subcutaneous edema in the thigh and calf, but reasonably normal marbling of adipose tissue in the muscles arguing against compartment syndrome. Clearly muscle compartment pressures are more sensitive and specific measure, and particularly in the upper thigh in the vicinity of the implant the streak artifact from the metal further reduces diagnostic sensitivity. If there is continued clinical concern for compartment syndrome than compartment manometry would be recommended. 3. Small knee effusion. 4. No gas tracking in the soft tissues, or specific findings  of high concern for abscess. Electronically Signed   By: Van Clines M.D.   On: 06/25/2015 14:29   Dg Chest Port 1 View  06/26/2015  CLINICAL DATA:  Sepsis and edema. EXAM:  PORTABLE CHEST - 1 VIEW COMPARISON:  One-view chest 06/25/2015. FINDINGS: The heart is enlarged. Pulmonary arteries are enlarged. Atherosclerotic changes are again noted at the aorta. Minimal bibasilar airspace disease likely reflects atelectasis. There is no edema or effusion to suggest failure. Emphysematous changes are suggested. The visualized soft tissues and bony thorax are unremarkable. IMPRESSION: 1. Cardiomegaly without failure. 2. Atherosclerosis. 3. Minimal bibasilar airspace disease likely reflects atelectasis. Electronically Signed   By: San Morelle M.D.   On: 06/26/2015 07:34   Dg Chest Portable 1 View  06/25/2015  CLINICAL DATA:  Found unresponsive, shortness of breath EXAM: PORTABLE CHEST 1 VIEW COMPARISON:  06/22/2007 FINDINGS: Cardiac shadow is stable. The lungs are well aerated bilaterally. No pneumothorax or focal infiltrate is seen. No acute fracture is noted. IMPRESSION: No acute abnormality seen. Electronically Signed   By: Inez Catalina M.D.   On: 06/25/2015 08:40   Dg Hip Unilat With Pelvis 2-3 Views Right  06/25/2015  CLINICAL DATA:  Right hip pain after fall. Periprosthetic fracture on CT. EXAM: DG HIP (WITH OR WITHOUT PELVIS) 2-3V RIGHT COMPARISON:  CT femur earlier this day. FINDINGS: Right hip arthroplasty, with known periprosthetic fracture about the greater trochanter and femoral stem. This is better characterized on recently performed CT. The displaced distal component is visualized. Femoral head component remains seated in the acetabular cup. Vascular calcifications are seen. IMPRESSION: Known periprosthetic fracture about the femoral stem of the right hip arthroplasty, better characterized on recent CT. Electronically Signed   By: Jeb Levering M.D.   On: 06/25/2015 21:07     Oren Binet, MD  [facility] Pager:[phone]  If 7PM-7AM, please contact night-coverage [email or web address] Password [email or web address] 06/29/2015, 10:03 AM   LOS: 4 days

## 2015-06-29 NOTE — Transfer of Care (Signed)
Immediate Anesthesia Transfer of Care Note  Patient: Gerald Sandoval  Procedure(s) Performed: Procedure(s): OPEN REDUCTION INTERNAL FIXATION (ORIF) RIGHT HIP PERIPROSTHETIC FRACTURE (Right)  Patient Location: PACU  Anesthesia Type:General  Level of Consciousness: awake  Airway & Oxygen Therapy: Patient Spontanous Breathing and Patient connected to face mask oxygen  Post-op Assessment: Report given to RN and Post -op Vital signs reviewed and stable  Post vital signs: Reviewed and stable  Last Vitals:  Filed Vitals:   06/29/15 1815 06/29/15 1830  BP: 183/153 170/92  Pulse: 60 36  Temp:    Resp: 21 20    Complications: No apparent anesthesia complications

## 2015-06-30 ENCOUNTER — Encounter (HOSPITAL_COMMUNITY): Payer: Self-pay | Admitting: Orthopaedic Surgery

## 2015-06-30 DIAGNOSIS — I4891 Unspecified atrial fibrillation: Secondary | ICD-10-CM

## 2015-06-30 DIAGNOSIS — I4819 Other persistent atrial fibrillation: Secondary | ICD-10-CM | POA: Insufficient documentation

## 2015-06-30 LAB — CULTURE, BLOOD (ROUTINE X 2)
Culture: NO GROWTH
Culture: NO GROWTH

## 2015-06-30 LAB — COMPREHENSIVE METABOLIC PANEL
ALBUMIN: 2.2 g/dL — AB (ref 3.5–5.0)
ALT: 21 U/L (ref 17–63)
AST: 36 U/L (ref 15–41)
Alkaline Phosphatase: 39 U/L (ref 38–126)
Anion gap: 11 (ref 5–15)
BILIRUBIN TOTAL: 1.2 mg/dL (ref 0.3–1.2)
BUN: 10 mg/dL (ref 6–20)
CO2: 24 mmol/L (ref 22–32)
Calcium: 7.8 mg/dL — ABNORMAL LOW (ref 8.9–10.3)
Chloride: 100 mmol/L — ABNORMAL LOW (ref 101–111)
Creatinine, Ser: 0.81 mg/dL (ref 0.61–1.24)
GFR calc Af Amer: >60 mL/min (ref >60)
GFR calc non Af Amer: >60 mL/min (ref >60)
GLUCOSE: 133 mg/dL — AB (ref 65–99)
POTASSIUM: 3.7 mmol/L (ref 3.5–5.1)
SODIUM: 135 mmol/L (ref 135–145)
TOTAL PROTEIN: 4.3 g/dL — AB (ref 6.5–8.1)

## 2015-06-30 LAB — CBC
HEMATOCRIT: 29 % — AB (ref 39.0–52.0)
HEMOGLOBIN: 10.1 g/dL — AB (ref 13.0–17.0)
MCH: 32.2 pg (ref 26.0–34.0)
MCHC: 34.8 g/dL (ref 30.0–36.0)
MCV: 92.4 fL (ref 78.0–100.0)
Platelets: 100 10*3/uL — ABNORMAL LOW (ref 150–400)
RBC: 3.14 MIL/uL — ABNORMAL LOW (ref 4.22–5.81)
RDW: 15.5 % (ref 11.5–15.5)
WBC: 8.4 10*3/uL (ref 4.0–10.5)

## 2015-06-30 MED ORDER — K PHOS MONO-SOD PHOS DI & MONO 155-852-130 MG PO TABS
625.0000 mg | ORAL_TABLET | Freq: Two times a day (BID) | ORAL | Status: DC
  Administered 2015-06-30 – 2015-07-03 (×7): 625 mg via ORAL
  Filled 2015-06-30 (×9): qty 2.5

## 2015-06-30 MED ORDER — ENOXAPARIN SODIUM 40 MG/0.4ML ~~LOC~~ SOLN
40.0000 mg | SUBCUTANEOUS | Status: DC
  Administered 2015-06-30 – 2015-07-01 (×2): 40 mg via SUBCUTANEOUS
  Filled 2015-06-30 (×2): qty 0.4

## 2015-06-30 MED ORDER — SODIUM CHLORIDE 0.9 % IV SOLN
Freq: Once | INTRAVENOUS | Status: DC
  Administered 2015-06-30: 19:00:00 via INTRAVENOUS

## 2015-06-30 MED ORDER — ENSURE ENLIVE PO LIQD
237.0000 mL | Freq: Two times a day (BID) | ORAL | Status: DC
  Administered 2015-07-01: 237 mL via ORAL

## 2015-06-30 MED ORDER — SODIUM CHLORIDE 0.9 % IV SOLN
INTRAVENOUS | Status: DC
  Administered 2015-06-30: 20:00:00 via INTRAVENOUS

## 2015-06-30 NOTE — Progress Notes (Signed)
PATIENT DETAILS Name: Gerald Sandoval Age: 82 y.o. 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 He was found to have sepsis of unknown etiology, acute renal failure, and a right periprosthetic hip fracture. He was managed in the stepdown unit, was placed on empiric antibiotics, IV fluids, orthopedics was consulted. Although sepsis pathophysiology has resolved, Hospital course has been complicated by development of worsening anemia, right lower extremity DVT requiring discontinuation of anticoagulation and placement of IVC filter. Patient was seen by cardiology and deemed to be a high risk candidate for orthopedic surgery.Subsequently underwent open reduction and internal fixation of right periprosthetic hip fracture on 2/2. Postoperatively developed A. fib RVR requiring IV Cardizem infusion-following which heart rate has significantly improved. Remains off anticoagulation-per orthopedics,ok to resume full dose anticoagulation in next 1-2 days.  Subjective: Hard of hearing-but denies SOB/Chest pain. Niece at bedside.  Assessment/Plan: Severe sepsis: Etiology unknown, however sepsis pathophysiology has resolved. Blood/urine cultures negative. Discontinued vancomycin on 2/1, will discontinue Zosyn 2/3-and placed back on amoxicillin (that he takes chronically for right hip infection).  Right hip periprosthetic fracture: Following a mechanical fall, orthopedics following-plans are OR on 2/2. Cardiology following for preoperative risk stratification-deemed to be a high risk-but okay to proceed. Patient accepted risks, and was  willing to proceed with surgery-which was done on 2/2. Postoperatively he briefly developed A. fib RVR requiring IV Cardizem infusion. Spoke with Dr. Perrin Maltese recommended prophylactic Lovenox for the next 1-2 days before starting full dose anticoagulation. Since platelet count improving, suspect we could cautiously start Lovenox.  Acute on chronic anemia: Suspect acute anemia multifactorial from chronic blood loss, acute illness and renal failure. No overt evidence of GI bleed (FOBT negative this admission) or evidence of retroperitoneal hematoma on CT scan. Status post 4 PRBC transfusion, hemoglobin currently stable.Was on Pradaxa prior to this admission, this was discontinued in the setting of worsening anemia. Please note, has been evaluated by gastroenterology in the past with a virtual colonoscopy September 2016 that was negative.  Acute renal failure: Resolved. Likely secondary to prerenal azotemia in the setting of sepsis. .  Right lower extremity DVT: Difficult situation- IVC filter placed 1/31.Had significant anemia with suspicion for ongoing chronic blood not a great candidate for long-term anticoagulation-however hemoglobin has now stabilized, no overt blood loss evident-discussed with patient/family risks vs benefits of long term anticoagulation-at this time they're leaning towards restarting anticoagulation. Given DVT, other extensive medical issues-suspect best to start Eliquis in the next few days when okay with orthopedics.  A. fib RVR: Has chronic A. fib, developed RVR briefly postoperatively on 2/2-managed with Cardizem infusion-have instructed nurse to transition off Cardizem infusion. Continue metoprolol for rate control. Patient was on Pradaxa as an outpatient but on hold for procedure/worsening anemia. Echo shows EF around 50-55%.  See above regarding anticoagulation  History of infected prosthetic left hip: On chronic suppressive therapy with amoxicillin-no indication of  abscess in CT abdomen/pelvis. Was on empiric Vanco/Zosyn-will place back on amoxicillin on 2/3.  Thrombocytopenia:? Etiology-but suspect secondary to sepsis, platelet consumption DVT. Improving, although subcutaneous heparin was discontinued on 2/1-given that platelet count is improving-we will  cautiously resume Lovenox, with plans to transition to Eliquis in the next few days.  Minimally elevated troponin: Trend not consistent with ACS. Supportive care. Echo shows preserved ejection fraction  Mild rhabdomyolysis: Likely secondary to be down prior to admission. CK downtrending with supportive care.  Disposition: Remain inpatient-monitor in SDU post-operatively till off cardizem gtt  Antimicrobial agents  See below  Anti-infectives    Start     Dose/Rate Route Frequency Ordered Stop   06/29/15 2200  amoxicillin (AMOXIL) tablet 500 mg  Status:  Discontinued     500 mg Oral 2 times daily 06/29/15 1945 06/29/15 2243   06/29/15 2200  sulfamethoxazole-trimethoprim (BACTRIM DS) 800-160 MG per tablet 1 tablet  Status:  Discontinued     1 tablet Oral 2 times daily 06/29/15 1946 06/29/15 2244   06/29/15 2100  ceFAZolin (ANCEF) IVPB 2 g/50 mL premix     2 g 100 mL/hr over 30 Minutes Intravenous Every 6 hours 06/29/15 1945 06/30/15 0930   06/29/15 1500  [MAR Hold]  ceFAZolin (ANCEF) IVPB 2 g/50 mL premix     (MAR Hold since 06/29/15 1436)  Comments:  Anesthesia to give preop   2 g 100 mL/hr over 30 Minutes Intravenous To [facility] 06/28/15 0727 06/29/15 1536   06/28/15 0700  vancomycin (VANCOCIN) IVPB 1000 mg/200 mL premix  Status:  Discontinued     1,000 mg 200 mL/hr over 60 Minutes Intravenous Every 24 hours 06/27/15 1253 06/28/15 1150   06/27/15 1000  vancomycin (VANCOCIN) IVPB 1000 mg/200 mL premix  Status:  Discontinued     1,000 mg 200 mL/hr over 60 Minutes Intravenous Every 48 hours 06/25/15 1358 06/25/15 1633   06/26/15 0930  vancomycin (VANCOCIN) IVPB 750 mg/150 ml premix   Status:  Discontinued     750 mg 150 mL/hr over 60 Minutes Intravenous Every 24 hours 06/25/15 1633 06/27/15 1253   06/25/15 1730  piperacillin-tazobactam (ZOSYN) IVPB 3.375 g     3.375 g 12.5 mL/hr over 240 Minutes Intravenous 3 times per day 06/25/15 1633     06/25/15 1700  piperacillin-tazobactam (ZOSYN) IVPB 2.25 g  Status:  Discontinued     2.25 g 100 mL/hr over 30 Minutes Intravenous Every 8 hours 06/25/15 1358 06/25/15 1633   06/25/15 0930  vancomycin (VANCOCIN) 1,500 mg in sodium chloride 0.9 % 500 mL IVPB     1,500 mg 250 mL/hr over 120 Minutes Intravenous  Once 06/25/15 0849 06/25/15 1249   06/25/15 0900  piperacillin-tazobactam (ZOSYN) IVPB 3.375 g     3.375 g 100 mL/hr over 30 Minutes Intravenous  Once 06/25/15 0849 06/25/15 0930      DVT Prophylaxis: SQ Lovenox  Code Status:  DNR  Family Communication Niece at bedside  Procedures: 1/31>>IVC filter  CONSULTS:  cardiology, orthopedic surgery and IR and palliative care  Time spent 25 minutes-Greater than 50% of this time was spent in counseling, explanation of diagnosis, planning of further management, and coordination of care.  MEDICATIONS: Scheduled Meds: . sodium chloride   Intravenous Once  . ergocalciferol  50,000 Units Oral UD  . ferrous sulfate  325 mg Oral Q breakfast  . finasteride  5 mg Oral Daily  . metoprolol tartrate  25 mg Oral BID  . phosphorus  625 mg Oral BID  . piperacillin-tazobactam (ZOSYN)  IV  3.375 g Intravenous 3 times per day  . senna-docusate  1 tablet Oral QHS  . sodium chloride flush  3 mL Intravenous Q12H  . tamsulosin  0.4  mg Oral QPC supper  . vitamin B-12  100 mcg Oral Daily   Continuous Infusions: . sodium chloride 125 mL/hr at 06/30/15 0700  . lactated ringers Stopped (06/30/15 1024)   PRN Meds:.acetaminophen **OR** acetaminophen, acetaminophen **OR** acetaminophen, albuterol, alum & mag hydroxide-simeth, HYDROcodone-acetaminophen, HYDROcodone-acetaminophen,  menthol-cetylpyridinium **OR** phenol, metoCLOPramide **OR** metoCLOPramide (REGLAN) injection, morphine injection, ondansetron **OR** ondansetron (ZOFRAN) IV, oxyCODONE, sodium chloride    PHYSICAL EXAM: Vital signs in last 24 hours: Filed Vitals:   06/30/15 0800 06/30/15 0900 06/30/15 1000 06/30/15 1022  BP: 107/59 104/59 106/57   Pulse: 72 99 94 75  Temp:      TempSrc:      Resp:      Height:      Weight:      SpO2:        Weight change:  Filed Weights   06/25/15 1450  Weight: 69.5 kg (153 lb 3.5 oz)   Body mass index is 23.3 kg/(m^2).   Gen Exam: Awake and alert with clear speech.   Neck: Supple, No JVD.   Chest: B/L Clear.   CVS: S1 S2 irregular, no murmurs. Abdomen: soft, BS +, non tender, non distended.  Extremities: no edema, lower extremities warm to touch. Neurologic: Non Focal.   Skin: No Rash Wounds: N/A.    Intake/Output from previous day:  Intake/Output Summary (Last 24 hours) at 06/30/15 1028 Last data filed at 06/30/15 1000  Gross per 24 hour  Intake 3565.83 ml  Output   1750 ml  Net 1815.83 ml     LAB RESULTS: CBC  Recent Labs Lab 06/25/15 0832  06/25/15 1600  06/26/15 0648 06/26/15 1838 06/27/15 0432 06/28/15 0630 06/30/15 0414  WBC 23.8*  < > 22.5*  < > 24.3* 20.6* 16.3* 10.7* 8.4  HGB 9.3*  < > 8.1*  < > 6.5* 8.3* 8.4* 11.2* 10.1*  HCT 27.1*  < > 22.6*  < > 17.8* 23.4* 24.0* 30.9* 29.0*  PLT 141*  < > 123*  < > 95* 95* 98* 82* 100*  MCV 98.9  < > 95.4  < > 94.7 91.8 92.0 90.9 92.4  MCH 33.9  < > 34.2*  < > 34.6* 32.5 32.2 32.9 32.2  MCHC 34.3  < > 35.8  < > 36.5* 35.5 35.0 36.2* 34.8  RDW 13.5  < > 13.2  < > 13.6 15.9* 16.5* 16.6* 15.5  LYMPHSABS 1.0  --  0.8  --   --   --   --   --   --   MONOABS 0.8  --  1.4*  --   --   --   --   --   --   EOSABS 0.0  --  0.0  --   --   --   --   --   --   BASOSABS 0.0  --  0.0  --   --   --   --   --   --   < > = values in this interval not displayed.  Chemistries   Recent Labs Lab  06/26/15 1837 06/27/15 0432 06/27/15 0802 06/28/15 0630 06/30/15 0414  NA 136 134* 132* 133* 135  K 3.8 3.3* 3.3* 4.2 3.7  CL 103 104 103 101 100*  CO2 24 22 22  19* 24  GLUCOSE 116* 94 95 89 133*  BUN 15 12 12 12 10   CREATININE 1.19 0.99 1.00 0.98 0.81  CALCIUM 7.7* 7.7* 7.5* 8.0* 7.8*    CBG:  Recent  Labs Lab 06/25/15 0850  GLUCAP 236*    GFR Estimated Creatinine Clearance: 59.8 mL/min (by C-G formula based on Cr of 0.81).  Coagulation profile  Recent Labs Lab 06/25/15 1600  INR 1.97*    Cardiac Enzymes  Recent Labs Lab 06/25/15 0832 06/26/15 1837 06/26/15 2220  TROPONINI 0.04* 0.07* 0.09*    Invalid input(s): POCBNP No results for input(s): DDIMER in the last 72 hours. No results for input(s): HGBA1C in the last 72 hours. No results for input(s): CHOL, HDL, LDLCALC, TRIG, CHOLHDL, LDLDIRECT in the last 72 hours. No results for input(s): TSH, T4TOTAL, T3FREE, THYROIDAB in the last 72 hours.  Invalid input(s): FREET3 No results for input(s): VITAMINB12, FOLATE, FERRITIN, TIBC, IRON, RETICCTPCT in the last 72 hours. No results for input(s): LIPASE, AMYLASE in the last 72 hours.  Urine Studies No results for input(s): UHGB, CRYS in the last 72 hours.  Invalid input(s): UACOL, UAPR, USPG, UPH, UTP, UGL, UKET, UBIL, UNIT, UROB, ULEU, UEPI, UWBC, URBC, UBAC, CAST, UCOM, BILUA  MICROBIOLOGY: Recent Results (from the past 240 hour(s))  Culture, blood (routine x 2)     Status: None (Preliminary result)   Collection Time: 06/25/15  8:25 AM  Result Value Ref Range Status   Specimen Description BLOOD LEFT ANTECUBITAL  Final   Special Requests   Final    BOTTLES DRAWN AEROBIC AND ANAEROBIC 5CC ANA [place] AER   Culture NO GROWTH 4 DAYS  Final   Report Status PENDING  Incomplete  Culture, blood (routine x 2)     Status: None (Preliminary result)   Collection Time: 06/25/15  8:30 AM  Result Value Ref Range Status   Specimen Description BLOOD RIGHT HAND  Final    Special Requests BOTTLES DRAWN AEROBIC ONLY 5CC  Final   Culture NO GROWTH 4 DAYS  Final   Report Status PENDING  Incomplete  Urine culture     Status: None   Collection Time: 06/25/15  9:11 AM  Result Value Ref Range Status   Specimen Description URINE, CATHETERIZED  Final   Special Requests NONE  Final   Culture NO GROWTH 1 DAY  Final   Report Status 06/26/2015 FINAL  Final  MRSA PCR Screening     Status: None   Collection Time: 06/25/15  3:28 PM  Result Value Ref Range Status   MRSA by PCR NEGATIVE NEGATIVE Final    Comment:        The GeneXpert MRSA Assay (FDA approved for NASAL specimens only), is one component of a comprehensive MRSA colonization surveillance program. It is not intended to diagnose MRSA infection nor to guide or monitor treatment for MRSA infections.   Urine culture     Status: None   Collection Time: 06/27/15  1:50 AM  Result Value Ref Range Status   Specimen Description URINE, CATHETERIZED  Final   Special Requests NONE  Final   Culture NO GROWTH 1 DAY  Final   Report Status 06/28/2015 FINAL  Final    RADIOLOGY STUDIES/RESULTS: Ct Abdomen Pelvis Wo Contrast  06/26/2015  CLINICAL DATA:  Acute anemia. Evaluate for retroperitoneal bleed. Severe sepsis with acute renal failure. EXAM: CT ABDOMEN AND PELVIS WITHOUT CONTRAST TECHNIQUE: Multidetector CT imaging of the abdomen and pelvis was performed following the standard protocol without IV contrast. COMPARISON:  CT virtual colonoscopy 01/27/2015. Abdominal pelvic CT 11/01/2011. FINDINGS: Lower chest: There are new small dependent low-density pleural effusions bilaterally with associated patchy atelectasis at both lung bases. The heart remains enlarged. Hepatobiliary:  As evaluated in the noncontrast state, the liver appears unremarkable. The gallbladder appears unremarkable. Previously noted biliary dilatation appears improved. Pancreas: Appears atrophied. No evidence of focal abnormality or surrounding  inflammation. Spleen: Stable appearance with multiple small granulomas. Adrenals/Urinary Tract: Stable without suspicious findings. There is stable subcapsular calcification posteriorly in the mid left kidney and a stable interpolar cyst in the right kidney, measuring up to 6.2 cm. No evidence of hydronephrosis. Stable tiny calculi in the lower pole of the left kidney. The bladder is partly obscured by artifact from the bilateral total hip arthroplasties, although demonstrates no abnormality. Stomach/Bowel: No evidence of bowel wall thickening, distention or surrounding inflammatory change. Vascular/Lymphatic: There are no enlarged abdominal or pelvic lymph nodes. There is diffuse atherosclerosis of the aorta, its branches and the iliac arteries. No evidence of retroperitoneal hematoma. Reproductive: Partly obscured by artifact from total hip arthroplasties. Grossly stable. Other: Mild subcutaneous edema within the abdominal wall bilaterally. Apparent asymmetric enlargement of the proximal right thigh without demonstrated focal hematoma. No evidence of hemoperitoneum. Musculoskeletal: No acute or significant osseous findings. Chronic appearing superior endplate compression deformities are noted at T10, T12 and L2. Previous bilateral total hip arthroplasty. IMPRESSION: 1. No evidence of retroperitoneal hematoma. There is mild asymmetric enlargement of the proximal right thigh without apparent hematoma. Correlate clinically. 2. Generalized soft tissue edema with new bilateral pleural effusions suspicious for anasarca. 3. Stable subcapsular calcification posteriorly in the left kidney and right renal cyst. No evidence of hydronephrosis or ureteral calculus. Electronically Signed   By: Richardean Sale M.D.   On: 06/26/2015 21:03   Ct Head Wo Contrast  06/25/2015  CLINICAL DATA:  Found on floor, unwitnessed fall. Head laceration. Altered mental status, confusion EXAM: CT HEAD WITHOUT CONTRAST CT CERVICAL SPINE WITHOUT  CONTRAST TECHNIQUE: Multidetector CT imaging of the head and cervical spine was performed following the standard protocol without intravenous contrast. Multiplanar CT image reconstructions of the cervical spine were also generated. COMPARISON:  None. FINDINGS: CT HEAD FINDINGS There is atrophy and chronic small vessel disease changes. No acute intracranial abnormality. Specifically, no hemorrhage, hydrocephalus, mass lesion, acute infarction, or significant intracranial injury. No acute calvarial abnormality. Visualized paranasal sinuses and mastoids clear. Orbital soft tissues unremarkable. CT CERVICAL SPINE FINDINGS Diffuse degenerative facet disease throughout the cervical spine. Degenerative disc disease in the lower cervical spine. Prevertebral soft tissues are normal. Alignment is normal. No fracture. No epidural or paraspinal hematoma. IMPRESSION: No acute intracranial abnormality. Atrophy, chronic microvascular disease. Degenerative changes in the cervical spine. No acute bony abnormality. Electronically Signed   By: Rolm Baptise M.D.   On: 06/25/2015 09:58   Ct Cervical Spine Wo Contrast  06/25/2015  CLINICAL DATA:  Found on floor, unwitnessed fall. Head laceration. Altered mental status, confusion EXAM: CT HEAD WITHOUT CONTRAST CT CERVICAL SPINE WITHOUT CONTRAST TECHNIQUE: Multidetector CT imaging of the head and cervical spine was performed following the standard protocol without intravenous contrast. Multiplanar CT image reconstructions of the cervical spine were also generated. COMPARISON:  None. FINDINGS: CT HEAD FINDINGS There is atrophy and chronic small vessel disease changes. No acute intracranial abnormality. Specifically, no hemorrhage, hydrocephalus, mass lesion, acute infarction, or significant intracranial injury. No acute calvarial abnormality. Visualized paranasal sinuses and mastoids clear. Orbital soft tissues unremarkable. CT CERVICAL SPINE FINDINGS Diffuse degenerative facet disease  throughout the cervical spine. Degenerative disc disease in the lower cervical spine. Prevertebral soft tissues are normal. Alignment is normal. No fracture. No epidural or paraspinal hematoma. IMPRESSION: No acute intracranial  abnormality. Atrophy, chronic microvascular disease. Degenerative changes in the cervical spine. No acute bony abnormality. Electronically Signed   By: Rolm Baptise M.D.   On: 06/25/2015 09:58   Ir Ivc Filter Plmt / S&i /img Guid/mod Sed  06/27/2015  CLINICAL DATA:  Right femur fracture and right lower extremity DVT. Prior to planned orthopedic surgery, request is been made to place an IVC filter. EXAM: 1. ULTRASOUND GUIDANCE FOR VASCULAR ACCESS OF THE RIGHT INTERNAL JUGULAR VEIN. 2. IVC VENOGRAM. 3. PERCUTANEOUS IVC FILTER PLACEMENT. ANESTHESIA/SEDATION: 50 mcg IV Fentanyl. Total Moderate Sedation Time 15 minutes. The patient's level of consciousness and physiologic status were continuously monitored during the procedure by Radiology nursing. CONTRAST:  91mL OMNIPAQUE IOHEXOL 300 MG/ML  SOLN FLUOROSCOPY TIME:  30 seconds. PROCEDURE: The procedure, risks, benefits, and alternatives were explained to the patient. Questions regarding the procedure were encouraged and answered. The patient understands and consents to the procedure. A time-out was performed prior to the procedure. The right neck was prepped with chlorhexidine in a sterile fashion, and a sterile drape was applied covering the operative field. A sterile gown and sterile gloves were used for the procedure. Local anesthesia was provided with 1% Lidocaine. Ultrasound was used to confirm patency of the right internal jugular vein. Under direct ultrasound guidance, a 21 gauge needle was advanced into the right internal jugular vein with ultrasound image documentation performed. After securing access with a micropuncture dilator, a guidewire was advanced into the inferior vena cava. A deployment sheath was advanced over the guidewire.  This was utilized to perform IVC venography. The deployment sheath was further positioned in an appropriate location for filter deployment. A Bard Denali IVC filter was then advanced in the sheath. This was then fully deployed in the infrarenal IVC. Final filter position was confirmed with a fluoroscopic spot image. Contrast injection was also performed through the sheath under fluoroscopy to confirm patency of the IVC at the level of the filter. After the procedure the sheath was removed and hemostasis obtained with manual compression. COMPLICATIONS: None. FINDINGS: IVC venography demonstrates a normal caliber IVC with no evidence of thrombus. Renal veins are identified bilaterally. The IVC is tortuous. The IVC filter was successfully positioned below the level of the renal veins and is appropriately oriented. This IVC filter has both permanent and retrievable indications. IMPRESSION: Placement of percutaneous IVC filter in infrarenal IVC. IVC venogram shows no evidence of IVC thrombus and normal caliber of the inferior vena cava. This filter does have both permanent and retrievable indications. Electronically Signed   By: Aletta Edouard M.D.   On: 06/27/2015 17:41   Ct Femur Right Wo Contrast  06/25/2015  CLINICAL DATA:  Leg pain. Pacemaker precludes MRI. Clinical concern for compartment syndrome. EXAM: CT OF THE RIGHT FEMUR WITHOUT CONTRAST; CT OF THE RIGHT TIBIA FIBULA WITHOUT CONTRAST TECHNIQUE: Multidetector CT imaging was performed according to the standard protocol. Multiplanar CT image reconstructions were also generated. COMPARISON:  None. FINDINGS: CT right femur: Right total hip prosthesis observed with a periprosthetic fracture tracking from the greater trochanter longitudinal along the stem. There is an oblique component which tracks medially and is displaced 1.4 cm posteromedial from the rest of the shaft. The tip of this oblique proximal medial fragment is about at the level of the tip of the  implant. However, the fracture appears to extend slightly further in a longitudinal fashion posteromedially as shown on images 87 through 112 of series 1. Below this level the fracture plane is no  longer well shown by CT. Bony demineralization is present. Common is requested for compartment syndrome. Assessment of the upper thigh is especially problematic due to the extensive streak artifact from the implant which can obscure the subtle intramuscular effacement of adipose tissue and other signs of compartment syndrome. I see no specific evidence of compartment syndrome but compartmental pressures would be a much more reliable check. Further distally and below the level of the implant, the visualized muscle compartments appear unusually hypertrophic/ well-developed for age, but still with marbling of adipose tissue in the muscles which would argue against a compartment syndrome. There is a small knee effusion along with some prepatellar edema. Diffuse atherosclerotic calcification is observed. CT right lower leg: No visualized tubular fibular fracture is observed; the distal most tip of the fibula is excluded. Diffuse atherosclerosis. There is subcutaneous edema tracking in the calf diffusely. Fatty marbling in the muscle tissues is present, slightly less notable in the tibialis anterior, but the other anterior compartmental muscles appear unremarkable and accordingly I am skeptical of compartment syndrome in the calf. IMPRESSION: 1. Periprosthetic fracture along the right hip implant, extending from the greater trochanter along the stem of the implant. This has a displaced component, with the medial fracture fragment about 1.4 cm posteromedial to the rest of the shaft at its distal tip. This distal tip is at about the level of the stem of the implant. There is also a longitudinal fracture component extending distally from the level of the tip of the implant within the shaft, which becomes indistinct several sent  centimeters distally. 2. There is subcutaneous edema in the thigh and calf, but reasonably normal marbling of adipose tissue in the muscles arguing against compartment syndrome. Clearly muscle compartment pressures are more sensitive and specific measure, and particularly in the upper thigh in the vicinity of the implant the streak artifact from the metal further reduces diagnostic sensitivity. If there is continued clinical concern for compartment syndrome than compartment manometry would be recommended. 3. Small knee effusion. 4. No gas tracking in the soft tissues, or specific findings of high concern for abscess. Electronically Signed   By: Van Clines M.D.   On: 06/25/2015 14:29   Ct Tibia Fibula Right Wo Contrast  06/25/2015  CLINICAL DATA:  Leg pain. Pacemaker precludes MRI. Clinical concern for compartment syndrome. EXAM: CT OF THE RIGHT FEMUR WITHOUT CONTRAST; CT OF THE RIGHT TIBIA FIBULA WITHOUT CONTRAST TECHNIQUE: Multidetector CT imaging was performed according to the standard protocol. Multiplanar CT image reconstructions were also generated. COMPARISON:  None. FINDINGS: CT right femur: Right total hip prosthesis observed with a periprosthetic fracture tracking from the greater trochanter longitudinal along the stem. There is an oblique component which tracks medially and is displaced 1.4 cm posteromedial from the rest of the shaft. The tip of this oblique proximal medial fragment is about at the level of the tip of the implant. However, the fracture appears to extend slightly further in a longitudinal fashion posteromedially as shown on images 87 through 112 of series 1. Below this level the fracture plane is no longer well shown by CT. Bony demineralization is present. Common is requested for compartment syndrome. Assessment of the upper thigh is especially problematic due to the extensive streak artifact from the implant which can obscure the subtle intramuscular effacement of adipose  tissue and other signs of compartment syndrome. I see no specific evidence of compartment syndrome but compartmental pressures would be a much more reliable check. Further distally and below the  level of the implant, the visualized muscle compartments appear unusually hypertrophic/ well-developed for age, but still with marbling of adipose tissue in the muscles which would argue against a compartment syndrome. There is a small knee effusion along with some prepatellar edema. Diffuse atherosclerotic calcification is observed. CT right lower leg: No visualized tubular fibular fracture is observed; the distal most tip of the fibula is excluded. Diffuse atherosclerosis. There is subcutaneous edema tracking in the calf diffusely. Fatty marbling in the muscle tissues is present, slightly less notable in the tibialis anterior, but the other anterior compartmental muscles appear unremarkable and accordingly I am skeptical of compartment syndrome in the calf. IMPRESSION: 1. Periprosthetic fracture along the right hip implant, extending from the greater trochanter along the stem of the implant. This has a displaced component, with the medial fracture fragment about 1.4 cm posteromedial to the rest of the shaft at its distal tip. This distal tip is at about the level of the stem of the implant. There is also a longitudinal fracture component extending distally from the level of the tip of the implant within the shaft, which becomes indistinct several sent centimeters distally. 2. There is subcutaneous edema in the thigh and calf, but reasonably normal marbling of adipose tissue in the muscles arguing against compartment syndrome. Clearly muscle compartment pressures are more sensitive and specific measure, and particularly in the upper thigh in the vicinity of the implant the streak artifact from the metal further reduces diagnostic sensitivity. If there is continued clinical concern for compartment syndrome than compartment  manometry would be recommended. 3. Small knee effusion. 4. No gas tracking in the soft tissues, or specific findings of high concern for abscess. Electronically Signed   By: Van Clines M.D.   On: 06/25/2015 14:29   Dg Chest Port 1 View  06/26/2015  CLINICAL DATA:  Sepsis and edema. EXAM: PORTABLE CHEST - 1 VIEW COMPARISON:  One-view chest 06/25/2015. FINDINGS: The heart is enlarged. Pulmonary arteries are enlarged. Atherosclerotic changes are again noted at the aorta. Minimal bibasilar airspace disease likely reflects atelectasis. There is no edema or effusion to suggest failure. Emphysematous changes are suggested. The visualized soft tissues and bony thorax are unremarkable. IMPRESSION: 1. Cardiomegaly without failure. 2. Atherosclerosis. 3. Minimal bibasilar airspace disease likely reflects atelectasis. Electronically Signed   By: San Morelle M.D.   On: 06/26/2015 07:34   Dg Chest Portable 1 View  06/25/2015  CLINICAL DATA:  Found unresponsive, shortness of breath EXAM: PORTABLE CHEST 1 VIEW COMPARISON:  06/22/2007 FINDINGS: Cardiac shadow is stable. The lungs are well aerated bilaterally. No pneumothorax or focal infiltrate is seen. No acute fracture is noted. IMPRESSION: No acute abnormality seen. Electronically Signed   By: Inez Catalina M.D.   On: 06/25/2015 08:40   Dg C-arm 1-60 Min  06/29/2015  CLINICAL DATA:  ORIF for repair of a periprosthetic fracture. 30 seconds of fluoro utilized. EXAM: DG C-ARM 61-120 MIN; DG HIP (WITH OR WITHOUT PELVIS) 2-3V RIGHT COMPARISON:  06/25/2015 FINDINGS: Four images show placement of a lateral fixation plate and multiple circumferential wires which reduce the periprosthetic proximal femur fracture components into anatomic alignment. The femoral and acetabular components of the right hip prosthesis appear well seated and aligned. IMPRESSION: Well aligned right proximal femur periprosthetic fracture components following ORIF. Electronically Signed    By: Lajean Manes M.D.   On: 06/29/2015 17:26   Dg Hip Port Unilat With Pelvis 1v Right  06/29/2015  CLINICAL DATA:  Status post open reduction  internal fixation of a periprosthetic right femur fracture. Initial encounter. EXAM: DG HIP (WITH OR WITHOUT PELVIS) 1V PORT RIGHT COMPARISON:  Plain films of the right hip 06/25/2015. FINDINGS: New lateral plate with 2 distal screws and cables proximally are seen about a periprosthetic right femur fracture. Right hip prosthesis is in place. The device is located. Position and alignment of the patient's fracture are near anatomic. No new abnormality is identified. IMPRESSION: ORIF periprosthetic right femur fracture without evidence of complication. No acute abnormality. Electronically Signed   By: Inge Rise M.D.   On: 06/29/2015 19:11   Dg Hip Unilat With Pelvis 2-3 Views Right  06/29/2015  CLINICAL DATA:  ORIF for repair of a periprosthetic fracture. 30 seconds of fluoro utilized. EXAM: DG C-ARM 61-120 MIN; DG HIP (WITH OR WITHOUT PELVIS) 2-3V RIGHT COMPARISON:  06/25/2015 FINDINGS: Four images show placement of a lateral fixation plate and multiple circumferential wires which reduce the periprosthetic proximal femur fracture components into anatomic alignment. The femoral and acetabular components of the right hip prosthesis appear well seated and aligned. IMPRESSION: Well aligned right proximal femur periprosthetic fracture components following ORIF. Electronically Signed   By: Lajean Manes M.D.   On: 06/29/2015 17:26   Dg Hip Unilat With Pelvis 2-3 Views Right  06/25/2015  CLINICAL DATA:  Right hip pain after fall. Periprosthetic fracture on CT. EXAM: DG HIP (WITH OR WITHOUT PELVIS) 2-3V RIGHT COMPARISON:  CT femur earlier this day. FINDINGS: Right hip arthroplasty, with known periprosthetic fracture about the greater trochanter and femoral stem. This is better characterized on recently performed CT. The displaced distal component is visualized. Femoral  head component remains seated in the acetabular cup. Vascular calcifications are seen. IMPRESSION: Known periprosthetic fracture about the femoral stem of the right hip arthroplasty, better characterized on recent CT. Electronically Signed   By: Jeb Levering M.D.   On: 06/25/2015 21:07    Oren Binet, MD  [facility] Pager:[phone]  If 7PM-7AM, please contact night-coverage [email or web address] Password [email or web address] 06/30/2015, 10:28 AM   LOS: 5 days

## 2015-06-30 NOTE — Progress Notes (Signed)
Pharm had question about restarting anticoagulation on patient. Dr. Sloan Leiter paged. Per MD he will look into and place orders as appropriate.

## 2015-06-30 NOTE — Evaluation (Signed)
Occupational Therapy Evaluation Patient Details Name: Gerald Sandoval MRN: [id number] DOB: [birth year] Today's Date: 06/30/2015    History of Present Illness 82 yo male s/p R hip surg after fall in [facility]. Pt with sepsis of unknown etiology acute renal failure and R periprosthetic hip fx. Pt with post op Afib with RVR. PMH: osteoarthritis, rotator cuff injury, kidney stones, colon polyp, anemnia, L hip abscess, gastric ulcer,   Clinical Impression   Patient is s/p R hip surgery resulting in functional limitations due to the deficits listed below (see OT problem list). PTA was living at independent living at [facility]. Patient will benefit from skilled OT acutely to increase independence and safety with ADLS to allow discharge SNF. Pt is unable to maintain NWB R LE at this time for basic transfers.      Follow Up Recommendations  SNF;Supervision/Assistance - 24 hour    Equipment Recommendations  3 in 1 bedside comode;Wheelchair (measurements OT);Wheelchair cushion (measurements OT);Hospital bed    Recommendations for Other Services       Precautions / Restrictions Precautions Precautions: Fall (R hip TDWB) Restrictions Weight Bearing Restrictions: Yes RLE Weight Bearing: Touchdown weight bearing      Mobility Bed Mobility Overal bed mobility: +2 for physical assistance;+ 2 for safety/equipment;Needs Assistance Bed Mobility: Supine to Sit     Supine to sit: +2 for physical assistance;Max assist;HOB elevated     General bed mobility comments: cues to hand placement, pad used to help pivot to EOB. Pt reaching and helping shift weight. tp required total (A) to advance R LE  Transfers Overall transfer level: Needs assistance Equipment used: Rolling walker (2 wheeled) Transfers: Sit to/from Stand Sit to Stand: +2 physical assistance;+2 safety/equipment;Max assist         General transfer comment: elevated surface and cues for hand placement     Balance Overall balance assessment: Needs assistance Sitting-balance support: Bilateral upper extremity supported;Feet supported Sitting balance-Leahy Scale: Fair Sitting balance - Comments: posterior pelvic tilt with kyphotic posture                                    ADL Overall ADL's : Needs assistance/impaired Eating/Feeding: Independent Eating/Feeding Details (indicate cue type and reason): pt with dentures out and reports only having dentures for ~ 6 months Grooming: Wash/dry face;Set up;Sitting   Upper Body Bathing: Moderate assistance;Bed level   Lower Body Bathing: Total assistance           Toilet Transfer: +2 for physical assistance;Maximal assistance;Stand-pivot;RW Toilet Transfer Details (indicate cue type and reason): pt unable to maintain NWB R LE           General ADL Comments: Pt agreeable to all therapy and eager to speak with therapist. pt gave very detailed back ground about lunch options and his history of kidney stones. pt choosing not to eat certain items due to kidney stones.      Vision     Perception     Praxis      Pertinent Vitals/Pain Pain Assessment: Faces Faces Pain Scale: Hurts even more Pain Location: R LE Pain Intervention(s): Monitored during session;Premedicated before session;Repositioned     Hand Dominance Right   Extremity/Trunk Assessment Upper Extremity Assessment Upper Extremity Assessment: Overall WFL for tasks assessed   Lower Extremity Assessment Lower Extremity Assessment: Defer to PT evaluation   Cervical / Trunk Assessment Cervical / Trunk Assessment: Kyphotic  Communication Communication Communication: HOH   Cognition Arousal/Alertness: Awake/alert Behavior During Therapy: WFL for tasks assessed/performed Overall Cognitive Status: Within Functional Limits for tasks assessed                     General Comments       Exercises       Shoulder Instructions      Home Living  Family/patient expects to be discharged to:: Skilled nursing facility                                        Prior Functioning/Environment Level of Independence: Independent             OT Diagnosis: Generalized weakness;Acute pain   OT Problem List: Decreased strength;Decreased activity tolerance;Impaired balance (sitting and/or standing);Decreased safety awareness;Decreased knowledge of use of DME or AE;Decreased knowledge of precautions;Cardiopulmonary status limiting activity;Pain   OT Treatment/Interventions: Self-care/ADL training;Therapeutic exercise;Neuromuscular education;DME and/or AE instruction;Therapeutic activities;Patient/family education;Balance training    OT Goals(Current goals can be found in the care plan section) Acute Rehab OT Goals Patient Stated Goal: to take it easy and hop for 6 weeks OT Goal Formulation: With patient Time For Goal Achievement: 07/14/15 Potential to Achieve Goals: Good  OT Frequency: Min 2X/week   Barriers to D/C:            Co-evaluation PT/OT/SLP Co-Evaluation/Treatment: Yes Reason for Co-Treatment: Complexity of the patient's impairments (multi-system involvement);For patient/therapist safety   OT goals addressed during session: ADL's and self-care;Strengthening/ROM      End of Session Equipment Utilized During Treatment: Gait belt;Rolling walker Nurse Communication: Mobility status;Precautions  Activity Tolerance: Patient tolerated treatment well Patient left: in chair;with call bell/phone within reach;with chair alarm set;with nursing/sitter in room   Time: 1429-1502 OT Time Calculation (min): 33 min Charges:  OT General Charges $OT Visit: 1 Procedure OT Evaluation $OT Eval High Complexity: 1 Procedure G-Codes:    Peri Maris 07/13/2015, 3:30 PM   Jeri Modena   OTR/L Pager: [phone] Office: [phone] .

## 2015-06-30 NOTE — Evaluation (Signed)
Physical Therapy Evaluation Patient Details Name: Gerald Sandoval MRN: [id number] DOB: 23-May-1924 Today's Date: 06/30/2015   History of Present Illness  82 yo male s/p R hip surg after fall in [facility] facility. Pt with sepsis of unknown etiology acute renal failure and R periprosthetic hip fx. Pt with post op Afib with RVR. PMH: osteoarthritis, rotator cuff injury, kidney stones, colon polyp, anemnia, L hip abscess, gastric ulcer,  Clinical Impression  Pt admitted with/for fall with R periprosthetic hip fx.  Pt having difficulty moving and maintaining TDWB on the RLE.  Pt currently limited functionally due to the problems listed. ( See problems list.)   Pt will benefit from PT to maximize function and safety in order to get ready for next venue listed below.     Follow Up Recommendations No PT follow up;SNF    Equipment Recommendations  Rolling walker with 5" wheels    Recommendations for Other Services       Precautions / Restrictions Precautions Precautions: Fall Restrictions Weight Bearing Restrictions: Yes RLE Weight Bearing: Touchdown weight bearing      Mobility  Bed Mobility Overal bed mobility: +2 for physical assistance;+ 2 for safety/equipment;Needs Assistance Bed Mobility: Supine to Sit     Supine to sit: +2 for physical assistance;Max assist;HOB elevated     General bed mobility comments: cues to hand placement, pad used to help pivot to EOB. Pt reaching and helping shift weight. tp required total (A) to advance R LE  Transfers Overall transfer level: Needs assistance Equipment used: Rolling walker (2 wheeled) Transfers: Sit to/from Stand Sit to Stand: +2 physical assistance;+2 safety/equipment;Max assist         General transfer comment: elevated surface and cues for hand placement.  Pt unable to maintain TDWB or do adequat "swing to""  Ambulation/Gait                Stairs            Wheelchair Mobility    Modified Rankin  (Stroke Patients Only)       Balance Overall balance assessment: Needs assistance Sitting-balance support: No upper extremity supported Sitting balance-Leahy Scale: Fair Sitting balance - Comments: posterior pelvic tilt with kyphotic posture     Standing balance-Leahy Scale: Poor Standing balance comment: heavy reliance on the RW                             Pertinent Vitals/Pain Pain Assessment: Faces Faces Pain Scale: Hurts even more Pain Location: RLE Pain Intervention(s): Monitored during session    Home Living Family/patient expects to be discharged to:: Skilled nursing facility                      Prior Function Level of Independence: Independent               Hand Dominance   Dominant Hand: Right    Extremity/Trunk Assessment   Upper Extremity Assessment: Defer to OT evaluation           Lower Extremity Assessment: RLE deficits/detail;LLE deficits/detail RLE Deficits / Details: AAROM limited to approx 60-70*, generally weak due to pain.    Cervical / Trunk Assessment: Kyphotic  Communication   Communication: HOH  Cognition Arousal/Alertness: Awake/alert Behavior During Therapy: WFL for tasks assessed/performed Overall Cognitive Status: Within Functional Limits for tasks assessed  General Comments General comments (skin integrity, edema, etc.): orthostatic with minimal symptoms. pt denies dizziness then states "well a little' Pt will go along with all treatment and not "complain" Staff need to be very direct in question adn monitor closely    Exercises        Assessment/Plan    PT Assessment Patient needs continued PT services  PT Diagnosis Difficulty walking;Generalized weakness;Acute pain   PT Problem List Decreased strength;Decreased range of motion;Decreased activity tolerance;Decreased balance;Decreased mobility;Decreased knowledge of use of DME;Decreased knowledge of precautions;Pain   PT Treatment Interventions Gait training;Functional mobility training;Therapeutic activities;Therapeutic exercise;Balance training;Patient/family education;DME instruction   PT Goals (Current goals can be found in the Care Plan section) Acute Rehab PT Goals Patient Stated Goal: to take it easy and hop for 6 weeks PT Goal Formulation: With patient Time For Goal Achievement: 07/07/15 Potential to Achieve Goals: Good    Frequency     Barriers to discharge Decreased caregiver support      Co-evaluation PT/OT/SLP Co-Evaluation/Treatment: Yes Reason for Co-Treatment: Complexity of the patient's impairments (multi-system involvement) PT goals addressed during session: Mobility/safety with mobility OT goals addressed during session: ADL's and self-care;Strengthening/ROM       End of Session   Activity Tolerance: Patient limited by pain   Nurse Communication: Mobility status         Time: [id number] PT Time Calculation (min) (ACUTE ONLY): 29 min   Charges:   PT Evaluation $PT Eval Moderate Complexity: 1 Procedure     PT G Codes:        Charlott Calvario, Tessie Fass 06/30/2015, 4:12 PM 06/30/2015  Donnella Sham, [place] [phone]  (pager)

## 2015-06-30 NOTE — Progress Notes (Signed)
Patient with total output of 200cc this shift. Poor oral intake. Dr. Sloan Leiter notified. Per MD give pt NS @ 50 for a total of 5 hours. Will administer and continue to monitor patient.

## 2015-06-30 NOTE — Progress Notes (Signed)
   Subjective:  Patient reports pain as mild.  No events.  Objective:   VITALS:   Filed Vitals:   06/30/15 0700 06/30/15 0800 06/30/15 0900 06/30/15 1000  BP: 134/62 107/59 104/59 106/57  Pulse: 109 72 99 94  Temp: 98 F (36.7 C)     TempSrc: Oral     Resp: 28     Height:      Weight:      SpO2: 96%       Neurologically intact Neurovascular intact Sensation intact distally Intact pulses distally Dorsiflexion/Plantar flexion intact Incision: dressing C/D/I and no drainage No cellulitis present Compartment soft   Lab Results  Component Value Date   WBC 8.4 06/30/2015   HGB 10.1* 06/30/2015   HCT 29.0* 06/30/2015   MCV 92.4 06/30/2015   PLT 100* 06/30/2015     Assessment/Plan:  1 Day Post-Op   - Expected postop acute blood loss anemia - will monitor for symptoms - Up with PT/OT - DVT ppx - SCDs, ambulation, lovenox - TDWB operative extremity - Pain control  Marianna Payment 06/30/2015, 10:21 AM [phone]

## 2015-06-30 NOTE — Progress Notes (Signed)
     SUBJECTIVE: No chest pain or SOB.   Tele: atrial fib, 80-90bpm  BP 113/57 mmHg  Pulse 79  Temp(Src) 98 F (36.7 C) (Oral)  Resp 18  Ht 5\' 8"  (1.727 m)  Wt 153 lb 3.5 oz (69.5 kg)  BMI 23.30 kg/m2  SpO2 98%  Intake/Output Summary (Last 24 hours) at 06/30/15 1118 Last data filed at 06/30/15 1101  Gross per 24 hour  Intake 3829.91 ml  Output   1850 ml  Net 1979.91 ml    PHYSICAL EXAM General: elderly male, thin, in no acute distress. Alert and oriented x 3.  Psych:  Good affect, responds appropriately Neck: No JVD. No masses noted.  Lungs: Clear bilaterally with no wheezes or rhonci noted.  Heart: irreg irreg with no murmurs noted. Abdomen: Bowel sounds are present. Soft, non-tender.  Extremities: No lower extremity edema.   LABS: Basic Metabolic Panel:  Recent Labs  06/28/15 0630 06/30/15 0414  NA 133* 135  K 4.2 3.7  CL 101 100*  CO2 19* 24  GLUCOSE 89 133*  BUN 12 10  CREATININE 0.98 0.81  CALCIUM 8.0* 7.8*   CBC:  Recent Labs  06/28/15 0630 06/30/15 0414  WBC 10.7* 8.4  HGB 11.2* 10.1*  HCT 30.9* 29.0*  MCV 90.9 92.4  PLT 82* 100*   Cardiac Enzymes:  Recent Labs  06/28/15 0630  CKTOTAL 435*   Current Meds: . sodium chloride   Intravenous Once  . enoxaparin (LOVENOX) injection  40 mg Subcutaneous Q24H  . ergocalciferol  50,000 Units Oral UD  . ferrous sulfate  325 mg Oral Q breakfast  . finasteride  5 mg Oral Daily  . metoprolol tartrate  25 mg Oral BID  . phosphorus  625 mg Oral BID  . piperacillin-tazobactam (ZOSYN)  IV  3.375 g Intravenous 3 times per day  . senna-docusate  1 tablet Oral QHS  . sodium chloride flush  3 mL Intravenous Q12H  . tamsulosin  0.4 mg Oral QPC supper  . vitamin B-12  100 mcg Oral Daily    ASSESSMENT AND PLAN:  1. Atrial fibrillation with RVR: He has been on a beta blocker but had RVR after surgery yesterday, controlled with IV Cardizem. He has chronic persistent atrial fibrillation. Back on beta  blocker this am and rate controlled. Echo with normal LV systolic function this admission.   2. Acute DVT: s/p IVC filter. Long term anti-coagulation may be best with Eliquis or Xarelto. To start when ok with surgical team.   Neizan Debruhl  2/3/201711:18 AM

## 2015-06-30 NOTE — NC FL2 (Signed)
[place] LEVEL OF CARE SCREENING TOOL     IDENTIFICATION  Patient Name: Gerald Sandoval Birthdate: Sep 21, 1923 Sex: male Admission Date (Current Location): 06/25/2015  [facility] and [place] Number:  Herbalist and Address:  The . [facility] [place] [address], [place], [place] [address]      Provider Number: [id number]  Attending Physician Name and Address:  Jonetta Osgood, MD  Relative Name and Phone Number:  Roxan Hockey - other.  Phone [phone]    Current Level of Care: Hospital Recommended Level of Care: [place] Prior Approval Number:    Date Approved/Denied:   PASRR Number: [id number] (Eff. 06/30/15)  Discharge Plan: SNF    Current Diagnoses: Patient Active Problem List   Diagnosis Date Noted  . Persistent atrial fibrillation ([place])   . Chronic anticoagulation-Pradaxa prior to admission 06/28/2015  . RBBB 06/28/2015  . DVT of Rt lower limb, acute ([place]) 06/28/2015  . S/P IVC filter 06/27/15 06/28/2015  . S/P Lt hip replacement, chronically infected [facility]) 06/28/2015  . Lactic acidosis   . Encounter for palliative care   . Goals of care, counseling/discussion   . Fracture - Rt hip prosthesis this adm   . Severe sepsis- found down at nursing home 06/25/2015  . Sepsis ([place]) 06/25/2015  . Edema of right lower extremity 06/25/2015  . Acute renal insufficiency-resolved 06/25/2015  . History of right hip replacement 06/25/2015  . Hyperkalemia 06/25/2015  . Metabolic acidosis [id number]  . Chronic a-fib ([place]) 08/04/2009  . EMPHYSEMA 08/03/2009  . OSTEOARTHRITIS 08/03/2009  . ARTHRITIS 08/03/2009  . GASTRIC ULCER 06/02/2009  . GASTRITIS 06/02/2009  . COLONIC POLYPS 05/03/2009  . DYSPHAGIA UNSPECIFIED 05/03/2009    Orientation RESPIRATION BLADDER Height & Weight     Self, Time, Situation, Place  Normal Continent Weight: 153 lb 3.5 oz (69.5 kg) Height:  5\' 8"  (172.7 cm)  BEHAVIORAL SYMPTOMS/MOOD  NEUROLOGICAL BOWEL NUTRITION STATUS      Continent Diet (DYS 3)  AMBULATORY STATUS COMMUNICATION OF NEEDS Skin   Extensive Assist (Patient did not ambulate with physical therapy) Verbally Normal, Other (Comment) (Incision right neck and right hip. Right periprosthetic hip fracture from a fall)                       Personal Care Assistance Level of Assistance  Bathing, Feeding, Dressing Bathing Assistance: Maximum assistance Feeding assistance: Independent Dressing Assistance: Maximum assistance     Functional Limitations Info  Sight, Hearing, Speech Sight Info: Adequate Hearing Info: Adequate Speech Info: Adequate    SPECIAL CARE FACTORS FREQUENCY  PT (By licensed PT), OT (By licensed OT)     PT Frequency: Evaluation 06/30/15 OT Frequency: Evaluation 2/3 and a minimum of 2x per week therapy recommended            Contractures Contractures Info: Not present    Additional Factors Info  Code Status, Allergies Code Status Info: DNR Allergies Info: Primidone           Current Medications (06/30/2015):  This is the current hospital active medication list Current Facility-Administered Medications  Medication Dose Route Frequency Provider Last Rate Last Dose  . 0.9 %  sodium chloride infusion   Intravenous Once Dianne Dun, NP 10 mL/hr at 06/26/15 0630    . acetaminophen (TYLENOL) tablet 650 mg  650 mg Oral Q6H PRN Willia Craze, NP       Or  . acetaminophen (TYLENOL) suppository 650  mg  650 mg Rectal Q6H PRN Willia Craze, NP      . acetaminophen (TYLENOL) tablet 650 mg  650 mg Oral Q6H PRN Naiping Ephriam Jenkins, MD       Or  . acetaminophen (TYLENOL) suppository 650 mg  650 mg Rectal Q6H PRN Naiping Ephriam Jenkins, MD      . albuterol (PROVENTIL) (2.5 MG/3ML) 0.083% nebulizer solution 2.5 mg  2.5 mg Nebulization Q2H PRN Shanker Kristeen Mans, MD      . alum & mag hydroxide-simeth (MAALOX/MYLANTA) 200-200-20 MG/5ML suspension 30 mL  30 mL Oral Q4H PRN Naiping Ephriam Jenkins, MD       . enoxaparin (LOVENOX) injection 40 mg  40 mg Subcutaneous Q24H Jonetta Osgood, MD   40 mg at 06/30/15 1057  . ergocalciferol (VITAMIN D2) capsule 50,000 Units  50,000 Units Oral UD Naiping Ephriam Jenkins, MD      . ferrous sulfate tablet 325 mg  325 mg Oral Q breakfast Naiping Ephriam Jenkins, MD   325 mg at 06/30/15 0741  . finasteride (PROSCAR) tablet 5 mg  5 mg Oral Daily Naiping Ephriam Jenkins, MD   5 mg at 06/30/15 1023  . HYDROcodone-acetaminophen (NORCO/VICODIN) 5-325 MG per tablet 1 tablet  1 tablet Oral Q4H PRN Janece Canterbury, MD      . HYDROcodone-acetaminophen (NORCO/VICODIN) 5-325 MG per tablet 1-2 tablet  1-2 tablet Oral Q6H PRN Naiping Ephriam Jenkins, MD      . lactated ringers infusion   Intravenous Continuous Oleta Mouse, MD   Stopped at 06/30/15 1024  . menthol-cetylpyridinium (CEPACOL) lozenge 3 mg  1 lozenge Oral PRN Naiping Ephriam Jenkins, MD       Or  . phenol (CHLORASEPTIC) mouth spray 1 spray  1 spray Mouth/Throat PRN Naiping Ephriam Jenkins, MD      . metoCLOPramide (REGLAN) tablet 5-10 mg  5-10 mg Oral Q8H PRN Naiping Ephriam Jenkins, MD       Or  . metoCLOPramide (REGLAN) injection 5-10 mg  5-10 mg Intravenous Q8H PRN Naiping Ephriam Jenkins, MD      . metoprolol tartrate (LOPRESSOR) tablet 25 mg  25 mg Oral BID Burnell Blanks, MD   25 mg at 06/30/15 1022  . morphine 2 MG/ML injection 0.5 mg  0.5 mg Intravenous Q2H PRN Naiping Ephriam Jenkins, MD      . ondansetron [facility]) tablet 4 mg  4 mg Oral Q6H PRN Naiping Ephriam Jenkins, MD       Or  . ondansetron [facility]) injection 4 mg  4 mg Intravenous Q6H PRN Naiping Ephriam Jenkins, MD      . oxyCODONE (Oxy IR/ROXICODONE) immediate release tablet 5-10 mg  5-10 mg Oral Q4H PRN Leandrew Koyanagi, MD      . phosphorus (K PHOS NEUTRAL) tablet 625 mg  625 mg Oral BID Jonetta Osgood, MD   625 mg at 06/30/15 1023  . piperacillin-tazobactam (ZOSYN) IVPB 3.375 g  3.375 g Intravenous 3 times per day Otilio Miu, RPH   3.375 g at 06/30/15 [id number]  . senna-docusate (Senokot-S) tablet 1 tablet  1 tablet Oral QHS Loistine Chance, MD    1 tablet at 06/29/15 2149  . sodium chloride 0.9 % bolus 250 mL  250 mL Intravenous Q4H PRN Reyne Dumas, MD      . sodium chloride flush (NS) 0.9 % injection 3 mL  3 mL Intravenous Q12H Willia Craze, NP   3 mL at 06/29/15 2151  . tamsulosin (FLOMAX) capsule  0.4 mg  0.4 mg Oral QPC supper Leandrew Koyanagi, MD   0.4 mg at 06/29/15 2148  . vitamin B-12 (CYANOCOBALAMIN) tablet 100 mcg  100 mcg Oral Daily Naiping Ephriam Jenkins, MD   100 mcg at 06/30/15 1023     Discharge Medications: Please see discharge summary for a list of discharge medications.  Relevant Imaging Results:  Relevant Lab Results:   Additional Information ss# - [id number].   Sable Feil, LCSW

## 2015-07-01 LAB — CBC
HEMATOCRIT: 25.1 % — AB (ref 39.0–52.0)
HEMOGLOBIN: 8.9 g/dL — AB (ref 13.0–17.0)
MCH: 33.3 pg (ref 26.0–34.0)
MCHC: 35.5 g/dL (ref 30.0–36.0)
MCV: 94 fL (ref 78.0–100.0)
Platelets: 106 10*3/uL — ABNORMAL LOW (ref 150–400)
RBC: 2.67 MIL/uL — AB (ref 4.22–5.81)
RDW: 15.6 % — ABNORMAL HIGH (ref 11.5–15.5)
WBC: 8.3 10*3/uL (ref 4.0–10.5)

## 2015-07-01 LAB — BASIC METABOLIC PANEL
Anion gap: 10 (ref 5–15)
BUN: 10 mg/dL (ref 6–20)
CHLORIDE: 99 mmol/L — AB (ref 101–111)
CO2: 26 mmol/L (ref 22–32)
CREATININE: 0.77 mg/dL (ref 0.61–1.24)
Calcium: 8.3 mg/dL — ABNORMAL LOW (ref 8.9–10.3)
GFR calc Af Amer: >60 mL/min (ref >60)
GFR calc non Af Amer: >60 mL/min (ref >60)
Glucose, Bld: 129 mg/dL — ABNORMAL HIGH (ref 65–99)
POTASSIUM: 3.7 mmol/L (ref 3.5–5.1)
Sodium: 135 mmol/L (ref 135–145)

## 2015-07-01 MED ORDER — METOPROLOL TARTRATE 1 MG/ML IV SOLN
5.0000 mg | INTRAVENOUS | Status: DC | PRN
  Filled 2015-07-01: qty 5

## 2015-07-01 MED ORDER — METOPROLOL TARTRATE 50 MG PO TABS
50.0000 mg | ORAL_TABLET | Freq: Two times a day (BID) | ORAL | Status: DC
  Administered 2015-07-01 – 2015-07-03 (×4): 50 mg via ORAL
  Filled 2015-07-01 (×4): qty 1

## 2015-07-01 MED ORDER — SODIUM CHLORIDE 0.9 % IV BOLUS (SEPSIS)
500.0000 mL | Freq: Once | INTRAVENOUS | Status: AC
  Administered 2015-07-01: 500 mL via INTRAVENOUS

## 2015-07-01 MED ORDER — AMOXICILLIN 500 MG PO CAPS
500.0000 mg | ORAL_CAPSULE | Freq: Two times a day (BID) | ORAL | Status: DC
  Administered 2015-07-01 – 2015-07-03 (×4): 500 mg via ORAL
  Filled 2015-07-01 (×5): qty 1

## 2015-07-01 MED ORDER — ENSURE ENLIVE PO LIQD
237.0000 mL | Freq: Three times a day (TID) | ORAL | Status: DC
  Administered 2015-07-01 – 2015-07-03 (×4): 237 mL via ORAL

## 2015-07-01 MED ORDER — AMOXICILLIN 250 MG PO CHEW
500.0000 mg | CHEWABLE_TABLET | Freq: Two times a day (BID) | ORAL | Status: DC
  Filled 2015-07-01: qty 2

## 2015-07-01 MED ORDER — ADULT MULTIVITAMIN W/MINERALS CH
1.0000 | ORAL_TABLET | Freq: Every day | ORAL | Status: DC
  Administered 2015-07-01 – 2015-07-03 (×3): 1 via ORAL
  Filled 2015-07-01 (×3): qty 1

## 2015-07-01 MED ORDER — POTASSIUM CHLORIDE CRYS ER 20 MEQ PO TBCR
40.0000 meq | EXTENDED_RELEASE_TABLET | Freq: Once | ORAL | Status: AC
  Administered 2015-07-01: 40 meq via ORAL
  Filled 2015-07-01: qty 2

## 2015-07-01 MED ORDER — OXYCODONE HCL 5 MG PO TABS: 5.0000 mg | ORAL_TABLET | ORAL | Status: DC | PRN

## 2015-07-01 NOTE — Progress Notes (Signed)
Left a voicemail for Gerald Sandoval at [phone].  Bernita Raisin, [place] Social Work [phone]

## 2015-07-01 NOTE — Progress Notes (Signed)
Central telemetry monitor personnel called and informed me we do not have an order for telemetry monitoring for patient.  I notified Dr. Candiss Norse by text message that we need a tele order.

## 2015-07-01 NOTE — Clinical Social Work Placement (Signed)
   CLINICAL SOCIAL WORK PLACEMENT  NOTE  Date:  07/01/2015  Patient Details  Name: Gerald Sandoval MRN: [id number] Date of Birth: 10-11-1923  Clinical Social Work is seeking post-discharge placement for this patient at the [place] level of care (*CSW will initial, date and re-position this form in  chart as items are completed):  Yes   Patient/family provided with [place] Work Department's list of facilities offering this level of care within the geographic area requested by the patient (or if unable, by the patient's family).  Yes   Patient/family informed of their freedom to choose among providers that offer the needed level of care, that participate in Medicare, Medicaid or managed care program needed by the patient, have an available bed and are willing to accept the patient.  Yes   Patient/family informed of [facility]'s ownership interest in [facility] and [facility], as well as of the fact that they are under no obligation to receive care at these facilities.  PASRR submitted to EDS on 06/30/15     PASRR number received on 07/01/15     Existing PASRR number confirmed on       FL2 transmitted to all facilities in geographic area requested by pt/family on       FL2 transmitted to all facilities within larger geographic area on       Patient informed that his/her managed care company has contracts with or will negotiate with certain facilities, including the following:            Patient/family informed of bed offers received.  Patient chooses bed at       Physician recommends and patient chooses bed at      Patient to be transferred to   on  .  Patient to be transferred to facility by       Patient family notified on   of transfer.  Name of family member notified:        PHYSICIAN       Additional Comment:    _______________________________________________ Matilde Bash, [place] 07/01/2015, 4:04 PM

## 2015-07-01 NOTE — Progress Notes (Signed)
Report given to RN, [place], for [address] [employer].

## 2015-07-01 NOTE — Clinical Social Work Note (Signed)
Clinical Social Work Assessment  Patient Details  Name: Gerald Sandoval MRN: [id number] Date of Birth: 05/23/1924  Date of referral:  07/01/15               Reason for consult:  Facility Placement                Permission sought to share information with:  Family Supports Permission granted to share information::  Yes, Verbal Permission Granted  Name::     Marcille Buffy  Agency::  [facility] SNFs  Relationship::  friend  Contact Information:     Housing/Transportation Living arrangements for the past 2 months:  [name]:  Patient Patient Interpreter Needed:  None Criminal Activity/Legal Involvement Pertinent to Current Situation/Hospitalization:  No - Comment as needed Significant Relationships:  Other Family Members, Friend Lives with:  Self Do you feel safe going back to the place where you live?  Yes Need for family participation in patient care:  No ([place])  Care giving concerns:  None, at this time.   Social Worker assessment / plan:  Member stated that he live at [employer] independently.  He understands that returning to that level of care isn't realistic, as his leg isn't moving like he would have hoped.  He agreed to SNF placement.  He asked that SW talked with his friend, Marcille Buffy, regarding bed options, as Sonia Baller has several SNFs in mind for him.  Employment status:  Retired Nurse, adult PT Recommendations:  [place] / Referral to community resources:     Patient/Family's Response to care:  Pt seemed pleased with his care.  Patient/Family's Understanding of and Emotional Response to Diagnosis, Current Treatment, and Prognosis:  Pt feels good about SNF placement.  Emotional Assessment Appearance:  Appears stated age Attitude/Demeanor/Rapport:   (calm) Affect (typically observed):  Accepting Orientation:  Oriented to Self, Oriented to Place, Oriented to   Time, Oriented to Situation Alcohol / Substance use:  Never Used Psych involvement (Current and /or in the community):  No (Comment)  Discharge Needs  Concerns to be addressed:  No discharge needs identified Readmission within the last 30 days:  No Current discharge risk:  None Barriers to Discharge:  No Barriers Identified   Matilde Bash, [place] 07/01/2015, 4:02 PM

## 2015-07-01 NOTE — Progress Notes (Signed)
Spoke with Mrs. Gerald Sandoval.  She is in agreement with a county-wide fax and stated that [facility] would be her first choice.  Bernita Raisin, [place] Social Work [phone]

## 2015-07-01 NOTE — Progress Notes (Signed)
PATIENT DETAILS Name: Gerald Sandoval Age: 82 y.o. 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 He was found to have sepsis of unknown etiology, acute renal failure, and a right periprosthetic hip fracture. He was managed in the stepdown unit, was placed on empiric antibiotics, IV fluids, orthopedics was consulted. Although sepsis pathophysiology has resolved, Hospital course has been complicated by development of worsening anemia, right lower extremity DVT requiring discontinuation of anticoagulation and placement of IVC filter. Patient was seen by cardiology and deemed to be a high risk candidate for orthopedic surgery.Subsequently underwent open reduction and internal fixation of right periprosthetic hip fracture on 2/2. Postoperatively developed A. fib RVR requiring IV Cardizem infusion-following which heart rate has significantly improved. Remains off anticoagulation-per orthopedics,ok to resume full dose anticoagulation in next 1-2 days.  Subjective:  Patient in bed feeling good, denies any fever or chills, no headache, no chest abdominal pain. No focal weakness.  Assessment/Plan:  Severe sepsis: Etiology unknown, however sepsis pathophysiology has resolved. Blood/urine cultures negative. Discontinued vancomycin on 2/1, Zosyn DC'd 2/3-and placed back on amoxicillin (that he takes chronically for right hip infection).  Right hip periprosthetic fracture: Following a mechanical fall, postoperative correction by Dr. Modesto Charon on 06/29/2015, has tolerated the procedure well, minimal perioperative blood loss  requiring some transfusion initially, per orthopedics if H&H stable could be started on long-term anticoagulation with caution. Currently on prophylactic dose Lovenox.   Acute on chronic anemia: Suspect acute anemia multifactorial from chronic blood loss, acute illness and renal failure, perioperative blood loss. No overt evidence of GI bleed (FOBT negative this admission) or evidence of retroperitoneal hematoma on CT scan. Status post 4 PRBC transfusion, monitor H&H, was on Pradaxa before we place on eliquis if H&H remains stable, has been evaluated by gastroenterology in the past with a virtual colonoscopy September 2016 that was negative.  Acute renal failure: Resolved. Likely secondary to prerenal azotemia in the setting of sepsis. .  Right lower extremity DVT:  IVC filter placed 1/31.Had significant anemia with suspicion for ongoing chronic blood not a great candidate for long-term anticoagulation-however hemoglobin has now stabilized, no overt blood loss evident-discussed with patient/family risks vs benefits of long term anticoagulation-at this time they're leaning towards restarting anticoagulation. Given DVT, other extensive medical issues-suspect best to start Eliquis in the next few days when okay with orthopedics.  Chronic A. fib [id number] Vasc 2 score of >3  seen by cardiology, currently on Lopressor for rate control, was on prior DEXA if stable will be placed on Ellik was instead to minimize bleeding possibilities. Echo shows EF around 50-55%.    History of infected prosthetic left hip: On chronic suppressive therapy with amoxicillin-no indication of abscess in CT abdomen/pelvis. Was on empiric Vanco/Zosyn-will place back on amoxicillin on 2/3.  Thrombocytopenia:? Due to sepsis and consumption from DVT, improving, monitor.  Minimally elevated troponin: Trend not consistent with ACS. Supportive care. Echo shows preserved ejection fraction seen by Cards.  Mild rhabdomyolysis: Likely  secondary to be down prior to admission. CK downtrending with supportive care.    Disposition: Med Floor - SNF  Antimicrobial agents  See below  Anti-infectives    Start     Dose/Rate Route Frequency Ordered Stop   06/29/15 2200  amoxicillin (AMOXIL) tablet 500 mg  Status:  Discontinued     500 mg Oral 2 times daily 06/29/15 1945 06/29/15 2243   06/29/15 2200  sulfamethoxazole-trimethoprim (BACTRIM DS) 800-160 MG per tablet 1 tablet  Status:  Discontinued     1 tablet Oral 2 times daily 06/29/15 1946 06/29/15 2244   06/29/15 2100  ceFAZolin (ANCEF) IVPB 2 g/50 mL premix     2 g 100 mL/hr over 30 Minutes Intravenous Every 6 hours 06/29/15 1945 06/30/15 0930   06/29/15 1500  [MAR Hold]  ceFAZolin (ANCEF) IVPB 2 g/50 mL premix     (MAR Hold since 06/29/15 1436)  Comments:  Anesthesia to give preop   2 g 100 mL/hr over 30 Minutes Intravenous To [facility] 06/28/15 0727 06/29/15 1536   06/28/15 0700  vancomycin (VANCOCIN) IVPB 1000 mg/200 mL premix  Status:  Discontinued     1,000 mg 200 mL/hr over 60 Minutes Intravenous Every 24 hours 06/27/15 1253 06/28/15 1150   06/27/15 1000  vancomycin (VANCOCIN) IVPB 1000 mg/200 mL premix  Status:  Discontinued     1,000 mg 200 mL/hr over 60 Minutes Intravenous Every 48 hours 06/25/15 1358 06/25/15 1633   06/26/15 0930  vancomycin (VANCOCIN) IVPB 750 mg/150 ml premix  Status:  Discontinued     750 mg 150 mL/hr over 60 Minutes Intravenous Every 24 hours 06/25/15 1633 06/27/15 1253   06/25/15 1730  piperacillin-tazobactam (ZOSYN) IVPB 3.375 g     3.375 g 12.5 mL/hr over 240 Minutes Intravenous 3 times per day 06/25/15 1633     06/25/15 1700  piperacillin-tazobactam (ZOSYN) IVPB 2.25 g  Status:  Discontinued     2.25 g 100 mL/hr over 30 Minutes Intravenous Every 8 hours 06/25/15 1358 06/25/15 1633   06/25/15 0930  vancomycin (VANCOCIN) 1,500 mg in sodium chloride 0.9 % 500 mL IVPB     1,500 mg 250 mL/hr over 120 Minutes Intravenous   Once 06/25/15 0849 06/25/15 1249   06/25/15 0900  piperacillin-tazobactam (ZOSYN) IVPB 3.375 g     3.375 g 100 mL/hr over 30 Minutes Intravenous  Once 06/25/15 0849 06/25/15 0930      DVT Prophylaxis: SQ Lovenox  Code Status:  DNR  Family Communication Niece at bedside  Procedures: 1/31>>IVC filter  CONSULTS:  cardiology, orthopedic surgery and IR and palliative care  Time spent 25 minutes-Greater than 50% of this time was spent in counseling, explanation of diagnosis, planning of further management, and coordination of care.  MEDICATIONS: Scheduled Meds: . sodium chloride   Intravenous Once  . enoxaparin (LOVENOX) injection  40 mg Subcutaneous Q24H  . feeding supplement (ENSURE ENLIVE)  237 mL Oral BID BM  . ferrous sulfate  325 mg Oral Q breakfast  . finasteride  5 mg Oral Daily  . metoprolol tartrate  25 mg Oral BID  . phosphorus  625 mg Oral BID  . piperacillin-tazobactam (ZOSYN)  IV  3.375 g Intravenous 3 times per day  . potassium chloride  40 mEq Oral Once  . senna-docusate  1 tablet Oral QHS  . tamsulosin  0.4 mg Oral QPC supper  . vitamin B-12  100 mcg Oral Daily  . Vitamin D (Ergocalciferol)  50,000 Units Oral UD   Continuous Infusions:   PRN Meds:.acetaminophen **OR** [DISCONTINUED] acetaminophen, albuterol, alum & mag hydroxide-simeth, HYDROcodone-acetaminophen, morphine injection, [DISCONTINUED] ondansetron **OR** ondansetron (ZOFRAN) IV,  oxyCODONE    PHYSICAL EXAM: Vital signs in last 24 hours: Filed Vitals:   06/30/15 2323 07/01/15 0317 07/01/15 0712 07/01/15 0800  BP: 113/52 115/52 102/50 120/64  Pulse: 110 100  101  Temp: 98.5 F (36.9 C) 97.9 F (36.6 C) 98 F (36.7 C)   TempSrc: Oral Axillary Oral   Resp: 30 24  25   Height:      Weight:      SpO2: 91% 99%  98%    Weight change:  Filed Weights   06/25/15 1450  Weight: 69.5 kg (153 lb 3.5 oz)   Body mass index is 23.3 kg/(m^2).   Gen Exam: Awake and alert with clear speech.     Neck: Supple, No JVD.   Chest: B/L Clear.   CVS: S1 S2 irregular, no murmurs. Abdomen: soft, BS +, non tender, non distended.  Extremities: no edema, lower extremities warm to touch. R post op site has a stable bruise, 3 inches in diameter. Neurologic: Non Focal.   Skin: No Rash Wounds: N/A.    Intake/Output from previous day:  Intake/Output Summary (Last 24 hours) at 07/01/15 1028 Last data filed at 07/01/15 0827  Gross per 24 hour  Intake 1820.41 ml  Output   1350 ml  Net 470.41 ml     LAB RESULTS: CBC  Recent Labs Lab 06/25/15 0832  06/25/15 1600  06/26/15 1838 06/27/15 0432 06/28/15 0630 06/30/15 0414 07/01/15 0432  WBC 23.8*  < > 22.5*  < > 20.6* 16.3* 10.7* 8.4 8.3  HGB 9.3*  < > 8.1*  < > 8.3* 8.4* 11.2* 10.1* 8.9*  HCT 27.1*  < > 22.6*  < > 23.4* 24.0* 30.9* 29.0* 25.1*  PLT 141*  < > 123*  < > 95* 98* 82* 100* 106*  MCV 98.9  < > 95.4  < > 91.8 92.0 90.9 92.4 94.0  MCH 33.9  < > 34.2*  < > 32.5 32.2 32.9 32.2 33.3  MCHC 34.3  < > 35.8  < > 35.5 35.0 36.2* 34.8 35.5  RDW 13.5  < > 13.2  < > 15.9* 16.5* 16.6* 15.5 15.6*  LYMPHSABS 1.0  --  0.8  --   --   --   --   --   --   MONOABS 0.8  --  1.4*  --   --   --   --   --   --   EOSABS 0.0  --  0.0  --   --   --   --   --   --   BASOSABS 0.0  --  0.0  --   --   --   --   --   --   < > = values in this interval not displayed.  Chemistries   Recent Labs Lab 06/27/15 0432 06/27/15 0802 06/28/15 0630 06/30/15 0414 07/01/15 0432  NA 134* 132* 133* 135 135  K 3.3* 3.3* 4.2 3.7 3.7  CL 104 103 101 100* 99*  CO2 22 22 19* 24 26  GLUCOSE 94 95 89 133* 129*  BUN 12 12 12 10 10   CREATININE 0.99 1.00 0.98 0.81 0.77  CALCIUM 7.7* 7.5* 8.0* 7.8* 8.3*    CBG:  Recent Labs Lab 06/25/15 0850  GLUCAP 236*    GFR Estimated Creatinine Clearance: 60.6 mL/min (by C-G formula based on Cr of 0.77).  Coagulation profile  Recent Labs Lab 06/25/15 1600  INR 1.97*    Cardiac Enzymes  Recent Labs  Lab  06/25/15 0832 06/26/15 1837 06/26/15 2220  TROPONINI 0.04* 0.07* 0.09*    Invalid input(s): POCBNP No results for input(s): DDIMER in the last 72 hours. No results for input(s): HGBA1C in the last 72 hours. No results for input(s): CHOL, HDL, LDLCALC, TRIG, CHOLHDL, LDLDIRECT in the last 72 hours. No results for input(s): TSH, T4TOTAL, T3FREE, THYROIDAB in the last 72 hours.  Invalid input(s): FREET3 No results for input(s): VITAMINB12, FOLATE, FERRITIN, TIBC, IRON, RETICCTPCT in the last 72 hours. No results for input(s): LIPASE, AMYLASE in the last 72 hours.  Urine Studies No results for input(s): UHGB, CRYS in the last 72 hours.  Invalid input(s): UACOL, UAPR, USPG, UPH, UTP, UGL, UKET, UBIL, UNIT, UROB, ULEU, UEPI, UWBC, URBC, UBAC, CAST, UCOM, BILUA  MICROBIOLOGY: Recent Results (from the past 240 hour(s))  Culture, blood (routine x 2)     Status: None   Collection Time: 06/25/15  8:25 AM  Result Value Ref Range Status   Specimen Description BLOOD LEFT ANTECUBITAL  Final   Special Requests   Final    BOTTLES DRAWN AEROBIC AND ANAEROBIC 5CC ANA [place] [facility]   Culture NO GROWTH 5 DAYS  Final   Report Status 06/30/2015 FINAL  Final  Culture, blood (routine x 2)     Status: None   Collection Time: 06/25/15  8:30 AM  Result Value Ref Range Status   Specimen Description BLOOD RIGHT HAND  Final   Special Requests BOTTLES DRAWN AEROBIC ONLY 5CC  Final   Culture NO GROWTH 5 DAYS  Final   Report Status 06/30/2015 FINAL  Final  Urine culture     Status: None   Collection Time: 06/25/15  9:11 AM  Result Value Ref Range Status   Specimen Description URINE, CATHETERIZED  Final   Special Requests NONE  Final   Culture NO GROWTH 1 DAY  Final   Report Status 06/26/2015 FINAL  Final  MRSA PCR Screening     Status: None   Collection Time: 06/25/15  3:28 PM  Result Value Ref Range Status   MRSA by PCR NEGATIVE NEGATIVE Final    Comment:        The GeneXpert MRSA Assay (FDA approved  for NASAL specimens only), is one component of a comprehensive MRSA colonization surveillance program. It is not intended to diagnose MRSA infection nor to guide or monitor treatment for MRSA infections.   Urine culture     Status: None   Collection Time: 06/27/15  1:50 AM  Result Value Ref Range Status   Specimen Description URINE, CATHETERIZED  Final   Special Requests NONE  Final   Culture NO GROWTH 1 DAY  Final   Report Status 06/28/2015 FINAL  Final    RADIOLOGY STUDIES/RESULTS: Ct Abdomen Pelvis Wo Contrast  06/26/2015  CLINICAL DATA:  Acute anemia. Evaluate for retroperitoneal bleed. Severe sepsis with acute renal failure. EXAM: CT ABDOMEN AND PELVIS WITHOUT CONTRAST TECHNIQUE: Multidetector CT imaging of the abdomen and pelvis was performed following the standard protocol without IV contrast. COMPARISON:  CT virtual colonoscopy 01/27/2015. Abdominal pelvic CT 11/01/2011. FINDINGS: Lower chest: There are new small dependent low-density pleural effusions bilaterally with associated patchy atelectasis at both lung bases. The heart remains enlarged. Hepatobiliary: As evaluated in the noncontrast state, the liver appears unremarkable. The gallbladder appears unremarkable. Previously noted biliary dilatation appears improved. Pancreas: Appears atrophied. No evidence of focal abnormality or surrounding inflammation. Spleen: Stable appearance with multiple small granulomas. Adrenals/Urinary Tract: Stable without suspicious findings. There  is stable subcapsular calcification posteriorly in the mid left kidney and a stable interpolar cyst in the right kidney, measuring up to 6.2 cm. No evidence of hydronephrosis. Stable tiny calculi in the lower pole of the left kidney. The bladder is partly obscured by artifact from the bilateral total hip arthroplasties, although demonstrates no abnormality. Stomach/Bowel: No evidence of bowel wall thickening, distention or surrounding inflammatory change.  Vascular/Lymphatic: There are no enlarged abdominal or pelvic lymph nodes. There is diffuse atherosclerosis of the aorta, its branches and the iliac arteries. No evidence of retroperitoneal hematoma. Reproductive: Partly obscured by artifact from total hip arthroplasties. Grossly stable. Other: Mild subcutaneous edema within the abdominal wall bilaterally. Apparent asymmetric enlargement of the proximal right thigh without demonstrated focal hematoma. No evidence of hemoperitoneum. Musculoskeletal: No acute or significant osseous findings. Chronic appearing superior endplate compression deformities are noted at T10, T12 and L2. Previous bilateral total hip arthroplasty. IMPRESSION: 1. No evidence of retroperitoneal hematoma. There is mild asymmetric enlargement of the proximal right thigh without apparent hematoma. Correlate clinically. 2. Generalized soft tissue edema with new bilateral pleural effusions suspicious for anasarca. 3. Stable subcapsular calcification posteriorly in the left kidney and right renal cyst. No evidence of hydronephrosis or ureteral calculus. Electronically Signed   By: Richardean Sale M.D.   On: 06/26/2015 21:03   Ct Head Wo Contrast  06/25/2015  CLINICAL DATA:  Found on floor, unwitnessed fall. Head laceration. Altered mental status, confusion EXAM: CT HEAD WITHOUT CONTRAST CT CERVICAL SPINE WITHOUT CONTRAST TECHNIQUE: Multidetector CT imaging of the head and cervical spine was performed following the standard protocol without intravenous contrast. Multiplanar CT image reconstructions of the cervical spine were also generated. COMPARISON:  None. FINDINGS: CT HEAD FINDINGS There is atrophy and chronic small vessel disease changes. No acute intracranial abnormality. Specifically, no hemorrhage, hydrocephalus, mass lesion, acute infarction, or significant intracranial injury. No acute calvarial abnormality. Visualized paranasal sinuses and mastoids clear. Orbital soft tissues  unremarkable. CT CERVICAL SPINE FINDINGS Diffuse degenerative facet disease throughout the cervical spine. Degenerative disc disease in the lower cervical spine. Prevertebral soft tissues are normal. Alignment is normal. No fracture. No epidural or paraspinal hematoma. IMPRESSION: No acute intracranial abnormality. Atrophy, chronic microvascular disease. Degenerative changes in the cervical spine. No acute bony abnormality. Electronically Signed   By: Rolm Baptise M.D.   On: 06/25/2015 09:58   Ct Cervical Spine Wo Contrast  06/25/2015  CLINICAL DATA:  Found on floor, unwitnessed fall. Head laceration. Altered mental status, confusion EXAM: CT HEAD WITHOUT CONTRAST CT CERVICAL SPINE WITHOUT CONTRAST TECHNIQUE: Multidetector CT imaging of the head and cervical spine was performed following the standard protocol without intravenous contrast. Multiplanar CT image reconstructions of the cervical spine were also generated. COMPARISON:  None. FINDINGS: CT HEAD FINDINGS There is atrophy and chronic small vessel disease changes. No acute intracranial abnormality. Specifically, no hemorrhage, hydrocephalus, mass lesion, acute infarction, or significant intracranial injury. No acute calvarial abnormality. Visualized paranasal sinuses and mastoids clear. Orbital soft tissues unremarkable. CT CERVICAL SPINE FINDINGS Diffuse degenerative facet disease throughout the cervical spine. Degenerative disc disease in the lower cervical spine. Prevertebral soft tissues are normal. Alignment is normal. No fracture. No epidural or paraspinal hematoma. IMPRESSION: No acute intracranial abnormality. Atrophy, chronic microvascular disease. Degenerative changes in the cervical spine. No acute bony abnormality. Electronically Signed   By: Rolm Baptise M.D.   On: 06/25/2015 09:58   Ir Ivc Filter Plmt / S&i /img Guid/mod Sed  06/27/2015  CLINICAL DATA:  Right femur fracture and right lower extremity DVT. Prior to planned orthopedic surgery,  request is been made to place an IVC filter. EXAM: 1. ULTRASOUND GUIDANCE FOR VASCULAR ACCESS OF THE RIGHT INTERNAL JUGULAR VEIN. 2. IVC VENOGRAM. 3. PERCUTANEOUS IVC FILTER PLACEMENT. ANESTHESIA/SEDATION: 50 mcg IV Fentanyl. Total Moderate Sedation Time 15 minutes. The patient's level of consciousness and physiologic status were continuously monitored during the procedure by Radiology nursing. CONTRAST:  61mL OMNIPAQUE IOHEXOL 300 MG/ML  SOLN FLUOROSCOPY TIME:  30 seconds. PROCEDURE: The procedure, risks, benefits, and alternatives were explained to the patient. Questions regarding the procedure were encouraged and answered. The patient understands and consents to the procedure. A time-out was performed prior to the procedure. The right neck was prepped with chlorhexidine in a sterile fashion, and a sterile drape was applied covering the operative field. A sterile gown and sterile gloves were used for the procedure. Local anesthesia was provided with 1% Lidocaine. Ultrasound was used to confirm patency of the right internal jugular vein. Under direct ultrasound guidance, a 21 gauge needle was advanced into the right internal jugular vein with ultrasound image documentation performed. After securing access with a micropuncture dilator, a guidewire was advanced into the inferior vena cava. A deployment sheath was advanced over the guidewire. This was utilized to perform IVC venography. The deployment sheath was further positioned in an appropriate location for filter deployment. A Bard Denali IVC filter was then advanced in the sheath. This was then fully deployed in the infrarenal IVC. Final filter position was confirmed with a fluoroscopic spot image. Contrast injection was also performed through the sheath under fluoroscopy to confirm patency of the IVC at the level of the filter. After the procedure the sheath was removed and hemostasis obtained with manual compression. COMPLICATIONS: None. FINDINGS: IVC  venography demonstrates a normal caliber IVC with no evidence of thrombus. Renal veins are identified bilaterally. The IVC is tortuous. The IVC filter was successfully positioned below the level of the renal veins and is appropriately oriented. This IVC filter has both permanent and retrievable indications. IMPRESSION: Placement of percutaneous IVC filter in infrarenal IVC. IVC venogram shows no evidence of IVC thrombus and normal caliber of the inferior vena cava. This filter does have both permanent and retrievable indications. Electronically Signed   By: Aletta Edouard M.D.   On: 06/27/2015 17:41   Ct Femur Right Wo Contrast  06/25/2015  CLINICAL DATA:  Leg pain. Pacemaker precludes MRI. Clinical concern for compartment syndrome. EXAM: CT OF THE RIGHT FEMUR WITHOUT CONTRAST; CT OF THE RIGHT TIBIA FIBULA WITHOUT CONTRAST TECHNIQUE: Multidetector CT imaging was performed according to the standard protocol. Multiplanar CT image reconstructions were also generated. COMPARISON:  None. FINDINGS: CT right femur: Right total hip prosthesis observed with a periprosthetic fracture tracking from the greater trochanter longitudinal along the stem. There is an oblique component which tracks medially and is displaced 1.4 cm posteromedial from the rest of the shaft. The tip of this oblique proximal medial fragment is about at the level of the tip of the implant. However, the fracture appears to extend slightly further in a longitudinal fashion posteromedially as shown on images 87 through 112 of series 1. Below this level the fracture plane is no longer well shown by CT. Bony demineralization is present. Common is requested for compartment syndrome. Assessment of the upper thigh is especially problematic due to the extensive streak artifact from the implant which can obscure the subtle intramuscular effacement of adipose tissue and other signs of  compartment syndrome. I see no specific evidence of compartment syndrome but  compartmental pressures would be a much more reliable check. Further distally and below the level of the implant, the visualized muscle compartments appear unusually hypertrophic/ well-developed for age, but still with marbling of adipose tissue in the muscles which would argue against a compartment syndrome. There is a small knee effusion along with some prepatellar edema. Diffuse atherosclerotic calcification is observed. CT right lower leg: No visualized tubular fibular fracture is observed; the distal most tip of the fibula is excluded. Diffuse atherosclerosis. There is subcutaneous edema tracking in the calf diffusely. Fatty marbling in the muscle tissues is present, slightly less notable in the tibialis anterior, but the other anterior compartmental muscles appear unremarkable and accordingly I am skeptical of compartment syndrome in the calf. IMPRESSION: 1. Periprosthetic fracture along the right hip implant, extending from the greater trochanter along the stem of the implant. This has a displaced component, with the medial fracture fragment about 1.4 cm posteromedial to the rest of the shaft at its distal tip. This distal tip is at about the level of the stem of the implant. There is also a longitudinal fracture component extending distally from the level of the tip of the implant within the shaft, which becomes indistinct several sent centimeters distally. 2. There is subcutaneous edema in the thigh and calf, but reasonably normal marbling of adipose tissue in the muscles arguing against compartment syndrome. Clearly muscle compartment pressures are more sensitive and specific measure, and particularly in the upper thigh in the vicinity of the implant the streak artifact from the metal further reduces diagnostic sensitivity. If there is continued clinical concern for compartment syndrome than compartment manometry would be recommended. 3. Small knee effusion. 4. No gas tracking in the soft tissues, or  specific findings of high concern for abscess. Electronically Signed   By: Van Clines M.D.   On: 06/25/2015 14:29   Ct Tibia Fibula Right Wo Contrast  06/25/2015  CLINICAL DATA:  Leg pain. Pacemaker precludes MRI. Clinical concern for compartment syndrome. EXAM: CT OF THE RIGHT FEMUR WITHOUT CONTRAST; CT OF THE RIGHT TIBIA FIBULA WITHOUT CONTRAST TECHNIQUE: Multidetector CT imaging was performed according to the standard protocol. Multiplanar CT image reconstructions were also generated. COMPARISON:  None. FINDINGS: CT right femur: Right total hip prosthesis observed with a periprosthetic fracture tracking from the greater trochanter longitudinal along the stem. There is an oblique component which tracks medially and is displaced 1.4 cm posteromedial from the rest of the shaft. The tip of this oblique proximal medial fragment is about at the level of the tip of the implant. However, the fracture appears to extend slightly further in a longitudinal fashion posteromedially as shown on images 87 through 112 of series 1. Below this level the fracture plane is no longer well shown by CT. Bony demineralization is present. Common is requested for compartment syndrome. Assessment of the upper thigh is especially problematic due to the extensive streak artifact from the implant which can obscure the subtle intramuscular effacement of adipose tissue and other signs of compartment syndrome. I see no specific evidence of compartment syndrome but compartmental pressures would be a much more reliable check. Further distally and below the level of the implant, the visualized muscle compartments appear unusually hypertrophic/ well-developed for age, but still with marbling of adipose tissue in the muscles which would argue against a compartment syndrome. There is a small knee effusion along with some prepatellar edema. Diffuse atherosclerotic calcification is  observed. CT right lower leg: No visualized tubular fibular  fracture is observed; the distal most tip of the fibula is excluded. Diffuse atherosclerosis. There is subcutaneous edema tracking in the calf diffusely. Fatty marbling in the muscle tissues is present, slightly less notable in the tibialis anterior, but the other anterior compartmental muscles appear unremarkable and accordingly I am skeptical of compartment syndrome in the calf. IMPRESSION: 1. Periprosthetic fracture along the right hip implant, extending from the greater trochanter along the stem of the implant. This has a displaced component, with the medial fracture fragment about 1.4 cm posteromedial to the rest of the shaft at its distal tip. This distal tip is at about the level of the stem of the implant. There is also a longitudinal fracture component extending distally from the level of the tip of the implant within the shaft, which becomes indistinct several sent centimeters distally. 2. There is subcutaneous edema in the thigh and calf, but reasonably normal marbling of adipose tissue in the muscles arguing against compartment syndrome. Clearly muscle compartment pressures are more sensitive and specific measure, and particularly in the upper thigh in the vicinity of the implant the streak artifact from the metal further reduces diagnostic sensitivity. If there is continued clinical concern for compartment syndrome than compartment manometry would be recommended. 3. Small knee effusion. 4. No gas tracking in the soft tissues, or specific findings of high concern for abscess. Electronically Signed   By: Van Clines M.D.   On: 06/25/2015 14:29   Dg Chest Port 1 View  06/26/2015  CLINICAL DATA:  Sepsis and edema. EXAM: PORTABLE CHEST - 1 VIEW COMPARISON:  One-view chest 06/25/2015. FINDINGS: The heart is enlarged. Pulmonary arteries are enlarged. Atherosclerotic changes are again noted at the aorta. Minimal bibasilar airspace disease likely reflects atelectasis. There is no edema or effusion to  suggest failure. Emphysematous changes are suggested. The visualized soft tissues and bony thorax are unremarkable. IMPRESSION: 1. Cardiomegaly without failure. 2. Atherosclerosis. 3. Minimal bibasilar airspace disease likely reflects atelectasis. Electronically Signed   By: San Morelle M.D.   On: 06/26/2015 07:34   Dg Chest Portable 1 View  06/25/2015  CLINICAL DATA:  Found unresponsive, shortness of breath EXAM: PORTABLE CHEST 1 VIEW COMPARISON:  06/22/2007 FINDINGS: Cardiac shadow is stable. The lungs are well aerated bilaterally. No pneumothorax or focal infiltrate is seen. No acute fracture is noted. IMPRESSION: No acute abnormality seen. Electronically Signed   By: Inez Catalina M.D.   On: 06/25/2015 08:40   Dg C-arm 1-60 Min  06/29/2015  CLINICAL DATA:  ORIF for repair of a periprosthetic fracture. 30 seconds of fluoro utilized. EXAM: DG C-ARM 61-120 MIN; DG HIP (WITH OR WITHOUT PELVIS) 2-3V RIGHT COMPARISON:  06/25/2015 FINDINGS: Four images show placement of a lateral fixation plate and multiple circumferential wires which reduce the periprosthetic proximal femur fracture components into anatomic alignment. The femoral and acetabular components of the right hip prosthesis appear well seated and aligned. IMPRESSION: Well aligned right proximal femur periprosthetic fracture components following ORIF. Electronically Signed   By: Lajean Manes M.D.   On: 06/29/2015 17:26   Dg Hip Port Unilat With Pelvis 1v Right  06/29/2015  CLINICAL DATA:  Status post open reduction internal fixation of a periprosthetic right femur fracture. Initial encounter. EXAM: DG HIP (WITH OR WITHOUT PELVIS) 1V PORT RIGHT COMPARISON:  Plain films of the right hip 06/25/2015. FINDINGS: New lateral plate with 2 distal screws and cables proximally are seen about a periprosthetic right  femur fracture. Right hip prosthesis is in place. The device is located. Position and alignment of the patient's fracture are near anatomic. No  new abnormality is identified. IMPRESSION: ORIF periprosthetic right femur fracture without evidence of complication. No acute abnormality. Electronically Signed   By: Inge Rise M.D.   On: 06/29/2015 19:11   Dg Hip Unilat With Pelvis 2-3 Views Right  06/29/2015  CLINICAL DATA:  ORIF for repair of a periprosthetic fracture. 30 seconds of fluoro utilized. EXAM: DG C-ARM 61-120 MIN; DG HIP (WITH OR WITHOUT PELVIS) 2-3V RIGHT COMPARISON:  06/25/2015 FINDINGS: Four images show placement of a lateral fixation plate and multiple circumferential wires which reduce the periprosthetic proximal femur fracture components into anatomic alignment. The femoral and acetabular components of the right hip prosthesis appear well seated and aligned. IMPRESSION: Well aligned right proximal femur periprosthetic fracture components following ORIF. Electronically Signed   By: Lajean Manes M.D.   On: 06/29/2015 17:26   Dg Hip Unilat With Pelvis 2-3 Views Right  06/25/2015  CLINICAL DATA:  Right hip pain after fall. Periprosthetic fracture on CT. EXAM: DG HIP (WITH OR WITHOUT PELVIS) 2-3V RIGHT COMPARISON:  CT femur earlier this day. FINDINGS: Right hip arthroplasty, with known periprosthetic fracture about the greater trochanter and femoral stem. This is better characterized on recently performed CT. The displaced distal component is visualized. Femoral head component remains seated in the acetabular cup. Vascular calcifications are seen. IMPRESSION: Known periprosthetic fracture about the femoral stem of the right hip arthroplasty, better characterized on recent CT. Electronically Signed   By: Jeb Levering M.D.   On: 06/25/2015 21:07    Signature  Thurnell Lose M.D on 07/01/2015 at 10:28 AM  Between 7am to 7pm - Pager - [phone], After 7pm go to [email or web address] - password [email or web address]  [phone]   LOS: 6 days

## 2015-07-01 NOTE — Progress Notes (Signed)
Initial Nutrition Assessment   INTERVENTION:  Provide Ensure Enlive po TID, each supplement provides 350 kcal and 20 grams of protein Provide Multivitamin with minerals daily Monitor PO intake for adequacy  NUTRITION DIAGNOSIS:   Inadequate oral intake related to poor appetite as evidenced by meal completion < 25%, per patient/family report.   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 He was found to have sepsis of unknown etiology, acute renal failure, and a right periprosthetic hip fracture.   Pt asleep at tome of visit. Per malnutrition screening tool, pt reported having a decreased appetite. Per chart, pt has lost 10 lbs in the past 5-6 months. Per RN, pt only ate a couple bites at breakfast this morning, but he did drink an Ensure. Per RN, he likes Ensure and vanilla pudding. Unable to perform nutrition-focused physical exam at this time; he does appear to have some temporal wasting.   Labs: low hemoglobin, low calcium  Diet Order:  DIET DYS 3 Room service appropriate?: Yes; Fluid consistency:: Thin  Skin:  Wound (see comment) (laceration on head)  Last BM:  2/1  Height:   Ht Readings from Last 1 Encounters:  06/25/15 5\' 8"  (1.727 m)    Weight:   Wt Readings from Last 1 Encounters:  06/25/15 153 lb 3.5 oz (69.5 kg)    Ideal Body Weight:  70 kg  BMI:  Body mass index is 23.3 kg/(m^2).  Estimated Nutritional Needs:   Kcal:  1700-1900  Protein:  85-100 grams  Fluid:  1.9 L/day  EDUCATION NEEDS:   No  education needs identified at this time  [place], LDN Inpatient Clinical Dietitian Pager: [phone] After Hours Pager: [phone]

## 2015-07-02 LAB — CBC
HEMATOCRIT: 25.7 % — AB (ref 39.0–52.0)
HEMOGLOBIN: 8.8 g/dL — AB (ref 13.0–17.0)
MCH: 33 pg (ref 26.0–34.0)
MCHC: 34.2 g/dL (ref 30.0–36.0)
MCV: 96.3 fL (ref 78.0–100.0)
Platelets: 137 10*3/uL — ABNORMAL LOW (ref 150–400)
RBC: 2.67 MIL/uL — ABNORMAL LOW (ref 4.22–5.81)
RDW: 16 % — ABNORMAL HIGH (ref 11.5–15.5)
WBC: 8.3 10*3/uL (ref 4.0–10.5)

## 2015-07-02 MED ORDER — APIXABAN 2.5 MG PO TABS
2.5000 mg | ORAL_TABLET | Freq: Two times a day (BID) | ORAL | Status: DC
  Administered 2015-07-02 – 2015-07-03 (×3): 2.5 mg via ORAL
  Filled 2015-07-02 (×3): qty 1

## 2015-07-02 NOTE — Progress Notes (Signed)
Subjective: 3 Days Post-Op Procedure(s) (LRB): OPEN REDUCTION INTERNAL FIXATION (ORIF) RIGHT HIP PERIPROSTHETIC FRACTURE (Right) Patient reports pain as moderate.  Stable acute blood loss anemia.  Awaiting SNF placement.  Objective: Vital signs in last 24 hours: Temp:  [98 F (36.7 C)-98.6 F (37 C)] 98 F (36.7 C) (02/05 0501) Pulse Rate:  [71-105] 71 (02/05 0501) Resp:  [18-25] 18 (02/05 0501) BP: (112-129)/(61-75) 115/75 mmHg (02/05 0501) SpO2:  [95 %-99 %] 97 % (02/05 0501)  Intake/Output from previous day: 02/04 0701 - 02/05 0700 In: 720 [P.O.:720] Out: 975 [Urine:975] Intake/Output this shift:     Recent Labs  06/30/15 0414 07/01/15 0432 07/02/15 0344  HGB 10.1* 8.9* 8.8*    Recent Labs  07/01/15 0432 07/02/15 0344  WBC 8.3 8.3  RBC 2.67* 2.67*  HCT 25.1* 25.7*  PLT 106* 137*    Recent Labs  06/30/15 0414 07/01/15 0432  NA 135 135  K 3.7 3.7  CL 100* 99*  CO2 24 26  BUN 10 10  CREATININE 0.81 0.77  GLUCOSE 133* 129*  CALCIUM 7.8* 8.3*   No results for input(s): LABPT, INR in the last 72 hours.  Sensation intact distally Intact pulses distally Dorsiflexion/Plantar flexion intact Incision: scant drainage  Assessment/Plan: 3 Days Post-Op Procedure(s) (LRB): OPEN REDUCTION INTERNAL FIXATION (ORIF) RIGHT HIP PERIPROSTHETIC FRACTURE (Right) Up with therapy - TDWB only right SNF placement soon  Gerald Sandoval 07/02/2015, 8:04 AM

## 2015-07-02 NOTE — Progress Notes (Signed)
ANTICOAGULATION CONSULT NOTE - Initial Consult  Pharmacy Consult for apixaban Indication: atrial fibrillation and DVT  Allergies  Allergen Reactions  . Primidone     Patient Measurements: Height: 5\' 8"  (172.7 cm) Weight: 153 lb 3.5 oz (69.5 kg) IBW/kg (Calculated) : 68.4  Vital Signs: Temp: 98 F (36.7 C) (02/05 0501) Temp Source: Oral (02/05 0501) BP: 115/75 mmHg (02/05 0501) Pulse Rate: 71 (02/05 0501)  Labs:  Recent Labs  06/30/15 0414 07/01/15 0432 07/02/15 0344  HGB 10.1* 8.9* 8.8*  HCT 29.0* 25.1* 25.7*  PLT 100* 106* 137*  CREATININE 0.81 0.77  --     Estimated Creatinine Clearance: 60.6 mL/min (by C-G formula based on Cr of 0.77).   Medical History: Past Medical History  Diagnosis Date  . Permanent atrial fibrillation ([place])   . Osteoarthritis   . Gastritis   . Gastric ulcer   . History of colonic polyps   . Dysphagia     unspecified  . Emphysema   . Diastolic dysfunction   . BPH (benign prostatic hyperplasia)   . Rotator cuff injury   . Hernia     Rt. Inguinial  . RBBB (right bundle branch block)   . Kidney stones   . Osteoporosis   . Abscess of left hip     infection left hip  . Hypogonadism in male   . Anemia   . Colon polyp     Medications:  Prescriptions prior to admission  Medication Sig Dispense Refill Last Dose  . amoxicillin (AMOXIL) 500 MG tablet Take 500 mg by mouth 2 (two) times daily.     unknown  . Cyanocobalamin (VITAMIN B-12) 5000 MCG SUBL Place 1 tablet under the tongue daily.     unknown  . metoprolol (TOPROL-XL) 50 MG 24 hr tablet Take 1.5 tablets (75 mg total) by mouth daily. 30 tablet 8 unknown  . sulfamethoxazole-trimethoprim (BACTRIM DS) 800-160 MG per tablet Take 1 tablet by mouth 2 (two) times daily.     unknown  . tamsulosin (FLOMAX) 0.4 MG CAPS capsule Take 0.4 mg by mouth.   unknown  . dabigatran (PRADAXA) 150 MG CAPS Take 150 mg by mouth every 12 (twelve) hours.     unknown  . ergocalciferol (VITAMIN D2)  50000 UNITS capsule Take 50,000 Units by mouth as directed.     unknown  . ferrous sulfate (IRON SUPPLEMENT) 325 (65 FE) MG tablet Take 325 mg by mouth daily with breakfast.     unknown  . finasteride (PROSCAR) 5 MG tablet Take 5 mg by mouth daily.     unknown  . potassium phosphate, monobasic, (K-PHOS ORIGINAL) 500 MG tablet Take 1,250 mg by mouth daily.     unknown    Assessment: 82 y/o male who presented to ED after being found down for unknown period of time. He took Pradaxa PTA for hx Afib. RLE DVT found on 1/31 and IVC filter placed. He had hip fracture repair on 2/2 with perioperative blood loss s/p 4 units PRBC. Extensive medical issues discussed with family who agreed to resume anticoagulation. Pharmacy consulted to begin apixaban. Hb stable in high 8's, platelets are low but trending up.  Patient does qualify for 5 mg BID dosing since weight >60 kg and SCr <1.5 however spoke with Dr. Candiss Norse who stated he would like the lower dose and not DVT dosing. Agree this is appropriate with bleeding risk, age >46 y/o and weight near cut off for lower dosing.  Goal of Therapy:  full  anticoagulation Monitor platelets by anticoagulation protocol: Yes   Plan:  Apixaban 2.5 mg PO bid Monitor for s/sx of bleeding  [facility], Pharm.D., BCPS Clinical Pharmacist Pager: [phone] 07/02/2015 9:48 AM

## 2015-07-02 NOTE — Progress Notes (Signed)
PATIENT DETAILS Name: Gerald Sandoval Age: 82 y.o. 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 He was found to have sepsis of unknown etiology, acute renal failure, and a right periprosthetic hip fracture. He was managed in the stepdown unit, was placed on empiric antibiotics, IV fluids, orthopedics was consulted. Although sepsis pathophysiology has resolved, Hospital course has been complicated by development of worsening anemia, right lower extremity DVT requiring discontinuation of anticoagulation and placement of IVC filter. Patient was seen by cardiology and deemed to be a high risk candidate for orthopedic surgery.Subsequently underwent open reduction and internal fixation of right periprosthetic hip fracture on 2/2. Postoperatively developed A. fib RVR requiring IV Cardizem infusion-following which heart rate has significantly improved. Remains off anticoagulation-per orthopedics,ok to resume full dose anticoagulation in next 1-2 days.  Subjective:  Patient in bed feeling good, denies any fever or chills, no headache, no chest abdominal pain. No focal weakness.  Assessment/Plan:  Severe sepsis: Etiology unknown, however sepsis pathophysiology has resolved. Blood/urine cultures negative. Discontinued vancomycin on 2/1, Zosyn DC'd 2/3-and placed back on amoxicillin (that he takes chronically for right hip infection).  Right hip periprosthetic fracture: Following a mechanical fall, postoperative correction by Dr. Modesto Charon on 06/29/2015, has tolerated the procedure well, minimal perioperative blood loss  requiring some transfusion initially, per orthopedics if H&H stable could be started on long-term anticoagulation with caution. Placed on Eliquis on the lowest possible dose on 07/02/2015 as H&H remains stable.   Acute on chronic anemia: Suspect acute anemia multifactorial from chronic blood loss, acute illness and renal failure, perioperative blood loss. No overt evidence of GI bleed (FOBT negative this admission) or evidence of retroperitoneal hematoma on CT scan. Status post 4 PRBC transfusion, monitor H&H, was on Pradaxa before but have now placed him on lowest dose eliquis on 07/02/2015 since H&H remains stable and to minimize bleeding risk, has been evaluated by gastroenterology in the past with a virtual colonoscopy September 2016 that was negative.  Acute renal failure: Resolved. Likely secondary to prerenal azotemia in the setting of sepsis. .  Right lower extremity DVT:  IVC filter placed 1/31.Had significant anemia with suspicion for ongoing chronic blood not a great candidate for long-term anticoagulation-however hemoglobin has now stabilized, no overt blood loss evident-discussed with patient/family risks vs benefits of long term anticoagulation-at this time they're leaning towards restarting anticoagulation. Given DVT have now placed him on Eliquis at lowest recommended dose on 07/02/2015.  Chronic A. fib [id number] Vasc 2 score of >3  seen by cardiology, currently on Lopressor for rate control, was on prior DEXA if stable will be placed on Ellik was instead to minimize bleeding possibilities. Echo shows EF around 50-55%.    History of infected prosthetic left hip: On chronic suppressive therapy with amoxicillin-no indication of abscess in CT abdomen/pelvis. Was on empiric Vanco/Zosyn- placed back on amoxicillin on 2/4.  Thrombocytopenia:? Due to sepsis and consumption from DVT, improving, monitor.  Minimally elevated troponin: Trend not consistent with ACS. Supportive care. Echo shows  preserved ejection fraction seen by Cards.  Mild rhabdomyolysis: Likely secondary to be down prior to admission.  CK downtrending with supportive care.    Disposition: SNF in am  Antimicrobial agents  See below  Anti-infectives    Start     Dose/Rate Route Frequency Ordered Stop   07/01/15 2200  amoxicillin (AMOXIL) chewable tablet 500 mg  Status:  Discontinued     500 mg Oral Every 12 hours 07/01/15 1251 07/01/15 1340   07/01/15 2200  amoxicillin (AMOXIL) capsule 500 mg     500 mg Oral Every 12 hours 07/01/15 1340     06/29/15 2200  amoxicillin (AMOXIL) tablet 500 mg  Status:  Discontinued     500 mg Oral 2 times daily 06/29/15 1945 06/29/15 2243   06/29/15 2200  sulfamethoxazole-trimethoprim (BACTRIM DS) 800-160 MG per tablet 1 tablet  Status:  Discontinued     1 tablet Oral 2 times daily 06/29/15 1946 06/29/15 2244   06/29/15 2100  ceFAZolin (ANCEF) IVPB 2 g/50 mL premix     2 g 100 mL/hr over 30 Minutes Intravenous Every 6 hours 06/29/15 1945 06/30/15 0930   06/29/15 1500  [MAR Hold]  ceFAZolin (ANCEF) IVPB 2 g/50 mL premix     (MAR Hold since 06/29/15 1436)  Comments:  Anesthesia to give preop   2 g 100 mL/hr over 30 Minutes Intravenous To [facility] 06/28/15 0727 06/29/15 1536   06/28/15 0700  vancomycin (VANCOCIN) IVPB 1000 mg/200 mL premix  Status:  Discontinued     1,000 mg 200 mL/hr over 60 Minutes Intravenous Every 24 hours 06/27/15 1253 06/28/15 1150   06/27/15 1000  vancomycin (VANCOCIN) IVPB 1000 mg/200 mL premix  Status:  Discontinued     1,000 mg 200 mL/hr over 60 Minutes Intravenous Every 48 hours 06/25/15 1358 06/25/15 1633   06/26/15 0930  vancomycin (VANCOCIN) IVPB 750 mg/150 ml premix  Status:  Discontinued     750 mg 150 mL/hr over 60 Minutes Intravenous Every 24 hours 06/25/15 1633 06/27/15 1253   06/25/15 1730  piperacillin-tazobactam (ZOSYN) IVPB 3.375 g  Status:  Discontinued     3.375 g 12.5 mL/hr over 240 Minutes Intravenous 3 times per day  06/25/15 1633 07/01/15 1249   06/25/15 1700  piperacillin-tazobactam (ZOSYN) IVPB 2.25 g  Status:  Discontinued     2.25 g 100 mL/hr over 30 Minutes Intravenous Every 8 hours 06/25/15 1358 06/25/15 1633   06/25/15 0930  vancomycin (VANCOCIN) 1,500 mg in sodium chloride 0.9 % 500 mL IVPB     1,500 mg 250 mL/hr over 120 Minutes Intravenous  Once 06/25/15 0849 06/25/15 1249   06/25/15 0900  piperacillin-tazobactam (ZOSYN) IVPB 3.375 g     3.375 g 100 mL/hr over 30 Minutes Intravenous  Once 06/25/15 0849 06/25/15 0930      DVT Prophylaxis: SQ Lovenox  Code Status:  DNR  Family Communication Niece at bedside  Procedures: 1/31>>IVC filter  CONSULTS:  cardiology, orthopedic surgery and IR and palliative care  Time spent 25 minutes-Greater than 50% of this time was spent in counseling, explanation of diagnosis, planning of further management, and coordination of care.  MEDICATIONS: Scheduled Meds: . sodium chloride   Intravenous Once  . amoxicillin  500 mg Oral Q12H  . apixaban  2.5 mg Oral BID  . feeding supplement (ENSURE ENLIVE)  237 mL Oral TID PC  . ferrous sulfate  325 mg Oral Q breakfast  . finasteride  5 mg Oral Daily  . metoprolol tartrate  50 mg Oral BID  . multivitamin with minerals  1 tablet Oral Daily  . phosphorus  625 mg Oral BID  . senna-docusate  1 tablet Oral QHS  . vitamin B-12  100 mcg Oral Daily   Continuous Infusions:   PRN Meds:.acetaminophen **OR** [DISCONTINUED] acetaminophen, albuterol, alum & mag hydroxide-simeth, HYDROcodone-acetaminophen, metoprolol, morphine injection, [DISCONTINUED] ondansetron **OR** ondansetron (ZOFRAN) IV, oxyCODONE    PHYSICAL EXAM: Vital signs in last 24 hours: Filed Vitals:   07/01/15 1300 07/01/15 1414 07/01/15 1831 07/02/15 0501  BP: 129/69 112/61 129/66 115/75  Pulse: 105 98 100 71  Temp:  98.6 F (37 C) 98.3 F (36.8 C) 98 F (36.7 C)  TempSrc:  Oral Oral Oral  Resp:   18 18  Height:      Weight:       SpO2: 99% 97% 95% 97%    Weight change:  Filed Weights   06/25/15 1450  Weight: 69.5 kg (153 lb 3.5 oz)   Body mass index is 23.3 kg/(m^2).   Gen Exam: Awake and alert with clear speech.   Neck: Supple, No JVD.   Chest: B/L Clear.   CVS: S1 S2 irregular, no murmurs. Abdomen: soft, BS +, non tender, non distended.  Extremities: no edema, lower extremities warm to touch. R post op site has a stable bruise, 3 inches in diameter. Neurologic: Non Focal.   Skin: No Rash Wounds: N/A.    Intake/Output from previous day:  Intake/Output Summary (Last 24 hours) at 07/02/15 1022 Last data filed at 07/02/15 0502  Gross per 24 hour  Intake    360 ml  Output    725 ml  Net   -365 ml     LAB RESULTS: CBC  Recent Labs Lab 06/25/15 1600  06/27/15 0432 06/28/15 0630 06/30/15 0414 07/01/15 0432 07/02/15 0344  WBC 22.5*  < > 16.3* 10.7* 8.4 8.3 8.3  HGB 8.1*  < > 8.4* 11.2* 10.1* 8.9* 8.8*  HCT 22.6*  < > 24.0* 30.9* 29.0* 25.1* 25.7*  PLT 123*  < > 98* 82* 100* 106* 137*  MCV 95.4  < > 92.0 90.9 92.4 94.0 96.3  MCH 34.2*  < > 32.2 32.9 32.2 33.3 33.0  MCHC 35.8  < > 35.0 36.2* 34.8 35.5 34.2  RDW 13.2  < > 16.5* 16.6* 15.5 15.6* 16.0*  LYMPHSABS 0.8  --   --   --   --   --   --   MONOABS 1.4*  --   --   --   --   --   --   EOSABS 0.0  --   --   --   --   --   --   BASOSABS 0.0  --   --   --   --   --   --   < > = values in this interval not displayed.  Chemistries   Recent Labs Lab 06/27/15 0432 06/27/15 0802 06/28/15 0630 06/30/15 0414 07/01/15 0432  NA 134* 132* 133* 135 135  K 3.3* 3.3* 4.2 3.7 3.7  CL 104 103 101 100* 99*  CO2 22 22 19* 24 26  GLUCOSE 94 95 89 133* 129*  BUN 12 12 12 10 10   CREATININE 0.99 1.00 0.98 0.81 0.77  CALCIUM 7.7* 7.5* 8.0* 7.8* 8.3*    CBG: No results for input(s): GLUCAP in the last 168 hours.  GFR Estimated Creatinine Clearance: 60.6 mL/min (by C-G formula based on Cr of 0.77).  Coagulation profile  Recent Labs Lab  06/25/15 1600  INR 1.97*    Cardiac Enzymes  Recent  Labs Lab 06/26/15 1837 06/26/15 2220  TROPONINI 0.07* 0.09*    Invalid input(s): POCBNP No results for input(s): DDIMER in the last 72 hours. No results for input(s): HGBA1C in the last 72 hours. No results for input(s): CHOL, HDL, LDLCALC, TRIG, CHOLHDL, LDLDIRECT in the last 72 hours. No results for input(s): TSH, T4TOTAL, T3FREE, THYROIDAB in the last 72 hours.  Invalid input(s): FREET3 No results for input(s): VITAMINB12, FOLATE, FERRITIN, TIBC, IRON, RETICCTPCT in the last 72 hours. No results for input(s): LIPASE, AMYLASE in the last 72 hours.  Urine Studies No results for input(s): UHGB, CRYS in the last 72 hours.  Invalid input(s): UACOL, UAPR, USPG, UPH, UTP, UGL, UKET, UBIL, UNIT, UROB, ULEU, UEPI, UWBC, URBC, UBAC, CAST, UCOM, BILUA  MICROBIOLOGY: Recent Results (from the past 240 hour(s))  Culture, blood (routine x 2)     Status: None   Collection Time: 06/25/15  8:25 AM  Result Value Ref Range Status   Specimen Description BLOOD LEFT ANTECUBITAL  Final   Special Requests   Final    BOTTLES DRAWN AEROBIC AND ANAEROBIC 5CC ANA [place] [facility]   Culture NO GROWTH 5 DAYS  Final   Report Status 06/30/2015 FINAL  Final  Culture, blood (routine x 2)     Status: None   Collection Time: 06/25/15  8:30 AM  Result Value Ref Range Status   Specimen Description BLOOD RIGHT HAND  Final   Special Requests BOTTLES DRAWN AEROBIC ONLY 5CC  Final   Culture NO GROWTH 5 DAYS  Final   Report Status 06/30/2015 FINAL  Final  Urine culture     Status: None   Collection Time: 06/25/15  9:11 AM  Result Value Ref Range Status   Specimen Description URINE, CATHETERIZED  Final   Special Requests NONE  Final   Culture NO GROWTH 1 DAY  Final   Report Status 06/26/2015 FINAL  Final  MRSA PCR Screening     Status: None   Collection Time: 06/25/15  3:28 PM  Result Value Ref Range Status   MRSA by PCR NEGATIVE NEGATIVE Final    Comment:         The GeneXpert MRSA Assay (FDA approved for NASAL specimens only), is one component of a comprehensive MRSA colonization surveillance program. It is not intended to diagnose MRSA infection nor to guide or monitor treatment for MRSA infections.   Urine culture     Status: None   Collection Time: 06/27/15  1:50 AM  Result Value Ref Range Status   Specimen Description URINE, CATHETERIZED  Final   Special Requests NONE  Final   Culture NO GROWTH 1 DAY  Final   Report Status 06/28/2015 FINAL  Final    RADIOLOGY STUDIES/RESULTS: Ct Abdomen Pelvis Wo Contrast  06/26/2015  CLINICAL DATA:  Acute anemia. Evaluate for retroperitoneal bleed. Severe sepsis with acute renal failure. EXAM: CT ABDOMEN AND PELVIS WITHOUT CONTRAST TECHNIQUE: Multidetector CT imaging of the abdomen and pelvis was performed following the standard protocol without IV contrast. COMPARISON:  CT virtual colonoscopy 01/27/2015. Abdominal pelvic CT 11/01/2011. FINDINGS: Lower chest: There are new small dependent low-density pleural effusions bilaterally with associated patchy atelectasis at both lung bases. The heart remains enlarged. Hepatobiliary: As evaluated in the noncontrast state, the liver appears unremarkable. The gallbladder appears unremarkable. Previously noted biliary dilatation appears improved. Pancreas: Appears atrophied. No evidence of focal abnormality or surrounding inflammation. Spleen: Stable appearance with multiple small granulomas. Adrenals/Urinary Tract: Stable without suspicious findings. There is stable subcapsular  calcification posteriorly in the mid left kidney and a stable interpolar cyst in the right kidney, measuring up to 6.2 cm. No evidence of hydronephrosis. Stable tiny calculi in the lower pole of the left kidney. The bladder is partly obscured by artifact from the bilateral total hip arthroplasties, although demonstrates no abnormality. Stomach/Bowel: No evidence of bowel wall thickening,  distention or surrounding inflammatory change. Vascular/Lymphatic: There are no enlarged abdominal or pelvic lymph nodes. There is diffuse atherosclerosis of the aorta, its branches and the iliac arteries. No evidence of retroperitoneal hematoma. Reproductive: Partly obscured by artifact from total hip arthroplasties. Grossly stable. Other: Mild subcutaneous edema within the abdominal wall bilaterally. Apparent asymmetric enlargement of the proximal right thigh without demonstrated focal hematoma. No evidence of hemoperitoneum. Musculoskeletal: No acute or significant osseous findings. Chronic appearing superior endplate compression deformities are noted at T10, T12 and L2. Previous bilateral total hip arthroplasty. IMPRESSION: 1. No evidence of retroperitoneal hematoma. There is mild asymmetric enlargement of the proximal right thigh without apparent hematoma. Correlate clinically. 2. Generalized soft tissue edema with new bilateral pleural effusions suspicious for anasarca. 3. Stable subcapsular calcification posteriorly in the left kidney and right renal cyst. No evidence of hydronephrosis or ureteral calculus. Electronically Signed   By: Richardean Sale M.D.   On: 06/26/2015 21:03   Ct Head Wo Contrast  06/25/2015  CLINICAL DATA:  Found on floor, unwitnessed fall. Head laceration. Altered mental status, confusion EXAM: CT HEAD WITHOUT CONTRAST CT CERVICAL SPINE WITHOUT CONTRAST TECHNIQUE: Multidetector CT imaging of the head and cervical spine was performed following the standard protocol without intravenous contrast. Multiplanar CT image reconstructions of the cervical spine were also generated. COMPARISON:  None. FINDINGS: CT HEAD FINDINGS There is atrophy and chronic small vessel disease changes. No acute intracranial abnormality. Specifically, no hemorrhage, hydrocephalus, mass lesion, acute infarction, or significant intracranial injury. No acute calvarial abnormality. Visualized paranasal sinuses and  mastoids clear. Orbital soft tissues unremarkable. CT CERVICAL SPINE FINDINGS Diffuse degenerative facet disease throughout the cervical spine. Degenerative disc disease in the lower cervical spine. Prevertebral soft tissues are normal. Alignment is normal. No fracture. No epidural or paraspinal hematoma. IMPRESSION: No acute intracranial abnormality. Atrophy, chronic microvascular disease. Degenerative changes in the cervical spine. No acute bony abnormality. Electronically Signed   By: Rolm Baptise M.D.   On: 06/25/2015 09:58   Ct Cervical Spine Wo Contrast  06/25/2015  CLINICAL DATA:  Found on floor, unwitnessed fall. Head laceration. Altered mental status, confusion EXAM: CT HEAD WITHOUT CONTRAST CT CERVICAL SPINE WITHOUT CONTRAST TECHNIQUE: Multidetector CT imaging of the head and cervical spine was performed following the standard protocol without intravenous contrast. Multiplanar CT image reconstructions of the cervical spine were also generated. COMPARISON:  None. FINDINGS: CT HEAD FINDINGS There is atrophy and chronic small vessel disease changes. No acute intracranial abnormality. Specifically, no hemorrhage, hydrocephalus, mass lesion, acute infarction, or significant intracranial injury. No acute calvarial abnormality. Visualized paranasal sinuses and mastoids clear. Orbital soft tissues unremarkable. CT CERVICAL SPINE FINDINGS Diffuse degenerative facet disease throughout the cervical spine. Degenerative disc disease in the lower cervical spine. Prevertebral soft tissues are normal. Alignment is normal. No fracture. No epidural or paraspinal hematoma. IMPRESSION: No acute intracranial abnormality. Atrophy, chronic microvascular disease. Degenerative changes in the cervical spine. No acute bony abnormality. Electronically Signed   By: Rolm Baptise M.D.   On: 06/25/2015 09:58   Ir Ivc Filter Plmt / S&i /img Guid/mod Sed  06/27/2015  CLINICAL DATA:  Right femur fracture  and right lower extremity DVT.  Prior to planned orthopedic surgery, request is been made to place an IVC filter. EXAM: 1. ULTRASOUND GUIDANCE FOR VASCULAR ACCESS OF THE RIGHT INTERNAL JUGULAR VEIN. 2. IVC VENOGRAM. 3. PERCUTANEOUS IVC FILTER PLACEMENT. ANESTHESIA/SEDATION: 50 mcg IV Fentanyl. Total Moderate Sedation Time 15 minutes. The patient's level of consciousness and physiologic status were continuously monitored during the procedure by Radiology nursing. CONTRAST:  22mL OMNIPAQUE IOHEXOL 300 MG/ML  SOLN FLUOROSCOPY TIME:  30 seconds. PROCEDURE: The procedure, risks, benefits, and alternatives were explained to the patient. Questions regarding the procedure were encouraged and answered. The patient understands and consents to the procedure. A time-out was performed prior to the procedure. The right neck was prepped with chlorhexidine in a sterile fashion, and a sterile drape was applied covering the operative field. A sterile gown and sterile gloves were used for the procedure. Local anesthesia was provided with 1% Lidocaine. Ultrasound was used to confirm patency of the right internal jugular vein. Under direct ultrasound guidance, a 21 gauge needle was advanced into the right internal jugular vein with ultrasound image documentation performed. After securing access with a micropuncture dilator, a guidewire was advanced into the inferior vena cava. A deployment sheath was advanced over the guidewire. This was utilized to perform IVC venography. The deployment sheath was further positioned in an appropriate location for filter deployment. A Bard Denali IVC filter was then advanced in the sheath. This was then fully deployed in the infrarenal IVC. Final filter position was confirmed with a fluoroscopic spot image. Contrast injection was also performed through the sheath under fluoroscopy to confirm patency of the IVC at the level of the filter. After the procedure the sheath was removed and hemostasis obtained with manual compression.  COMPLICATIONS: None. FINDINGS: IVC venography demonstrates a normal caliber IVC with no evidence of thrombus. Renal veins are identified bilaterally. The IVC is tortuous. The IVC filter was successfully positioned below the level of the renal veins and is appropriately oriented. This IVC filter has both permanent and retrievable indications. IMPRESSION: Placement of percutaneous IVC filter in infrarenal IVC. IVC venogram shows no evidence of IVC thrombus and normal caliber of the inferior vena cava. This filter does have both permanent and retrievable indications. Electronically Signed   By: Aletta Edouard M.D.   On: 06/27/2015 17:41   Ct Femur Right Wo Contrast  06/25/2015  CLINICAL DATA:  Leg pain. Pacemaker precludes MRI. Clinical concern for compartment syndrome. EXAM: CT OF THE RIGHT FEMUR WITHOUT CONTRAST; CT OF THE RIGHT TIBIA FIBULA WITHOUT CONTRAST TECHNIQUE: Multidetector CT imaging was performed according to the standard protocol. Multiplanar CT image reconstructions were also generated. COMPARISON:  None. FINDINGS: CT right femur: Right total hip prosthesis observed with a periprosthetic fracture tracking from the greater trochanter longitudinal along the stem. There is an oblique component which tracks medially and is displaced 1.4 cm posteromedial from the rest of the shaft. The tip of this oblique proximal medial fragment is about at the level of the tip of the implant. However, the fracture appears to extend slightly further in a longitudinal fashion posteromedially as shown on images 87 through 112 of series 1. Below this level the fracture plane is no longer well shown by CT. Bony demineralization is present. Common is requested for compartment syndrome. Assessment of the upper thigh is especially problematic due to the extensive streak artifact from the implant which can obscure the subtle intramuscular effacement of adipose tissue and other signs of compartment syndrome. I  see no specific  evidence of compartment syndrome but compartmental pressures would be a much more reliable check. Further distally and below the level of the implant, the visualized muscle compartments appear unusually hypertrophic/ well-developed for age, but still with marbling of adipose tissue in the muscles which would argue against a compartment syndrome. There is a small knee effusion along with some prepatellar edema. Diffuse atherosclerotic calcification is observed. CT right lower leg: No visualized tubular fibular fracture is observed; the distal most tip of the fibula is excluded. Diffuse atherosclerosis. There is subcutaneous edema tracking in the calf diffusely. Fatty marbling in the muscle tissues is present, slightly less notable in the tibialis anterior, but the other anterior compartmental muscles appear unremarkable and accordingly I am skeptical of compartment syndrome in the calf. IMPRESSION: 1. Periprosthetic fracture along the right hip implant, extending from the greater trochanter along the stem of the implant. This has a displaced component, with the medial fracture fragment about 1.4 cm posteromedial to the rest of the shaft at its distal tip. This distal tip is at about the level of the stem of the implant. There is also a longitudinal fracture component extending distally from the level of the tip of the implant within the shaft, which becomes indistinct several sent centimeters distally. 2. There is subcutaneous edema in the thigh and calf, but reasonably normal marbling of adipose tissue in the muscles arguing against compartment syndrome. Clearly muscle compartment pressures are more sensitive and specific measure, and particularly in the upper thigh in the vicinity of the implant the streak artifact from the metal further reduces diagnostic sensitivity. If there is continued clinical concern for compartment syndrome than compartment manometry would be recommended. 3. Small knee effusion. 4. No gas  tracking in the soft tissues, or specific findings of high concern for abscess. Electronically Signed   By: Van Clines M.D.   On: 06/25/2015 14:29   Ct Tibia Fibula Right Wo Contrast  06/25/2015  CLINICAL DATA:  Leg pain. Pacemaker precludes MRI. Clinical concern for compartment syndrome. EXAM: CT OF THE RIGHT FEMUR WITHOUT CONTRAST; CT OF THE RIGHT TIBIA FIBULA WITHOUT CONTRAST TECHNIQUE: Multidetector CT imaging was performed according to the standard protocol. Multiplanar CT image reconstructions were also generated. COMPARISON:  None. FINDINGS: CT right femur: Right total hip prosthesis observed with a periprosthetic fracture tracking from the greater trochanter longitudinal along the stem. There is an oblique component which tracks medially and is displaced 1.4 cm posteromedial from the rest of the shaft. The tip of this oblique proximal medial fragment is about at the level of the tip of the implant. However, the fracture appears to extend slightly further in a longitudinal fashion posteromedially as shown on images 87 through 112 of series 1. Below this level the fracture plane is no longer well shown by CT. Bony demineralization is present. Common is requested for compartment syndrome. Assessment of the upper thigh is especially problematic due to the extensive streak artifact from the implant which can obscure the subtle intramuscular effacement of adipose tissue and other signs of compartment syndrome. I see no specific evidence of compartment syndrome but compartmental pressures would be a much more reliable check. Further distally and below the level of the implant, the visualized muscle compartments appear unusually hypertrophic/ well-developed for age, but still with marbling of adipose tissue in the muscles which would argue against a compartment syndrome. There is a small knee effusion along with some prepatellar edema. Diffuse atherosclerotic calcification is observed. CT right  lower leg:  No visualized tubular fibular fracture is observed; the distal most tip of the fibula is excluded. Diffuse atherosclerosis. There is subcutaneous edema tracking in the calf diffusely. Fatty marbling in the muscle tissues is present, slightly less notable in the tibialis anterior, but the other anterior compartmental muscles appear unremarkable and accordingly I am skeptical of compartment syndrome in the calf. IMPRESSION: 1. Periprosthetic fracture along the right hip implant, extending from the greater trochanter along the stem of the implant. This has a displaced component, with the medial fracture fragment about 1.4 cm posteromedial to the rest of the shaft at its distal tip. This distal tip is at about the level of the stem of the implant. There is also a longitudinal fracture component extending distally from the level of the tip of the implant within the shaft, which becomes indistinct several sent centimeters distally. 2. There is subcutaneous edema in the thigh and calf, but reasonably normal marbling of adipose tissue in the muscles arguing against compartment syndrome. Clearly muscle compartment pressures are more sensitive and specific measure, and particularly in the upper thigh in the vicinity of the implant the streak artifact from the metal further reduces diagnostic sensitivity. If there is continued clinical concern for compartment syndrome than compartment manometry would be recommended. 3. Small knee effusion. 4. No gas tracking in the soft tissues, or specific findings of high concern for abscess. Electronically Signed   By: Van Clines M.D.   On: 06/25/2015 14:29   Dg Chest Port 1 View  06/26/2015  CLINICAL DATA:  Sepsis and edema. EXAM: PORTABLE CHEST - 1 VIEW COMPARISON:  One-view chest 06/25/2015. FINDINGS: The heart is enlarged. Pulmonary arteries are enlarged. Atherosclerotic changes are again noted at the aorta. Minimal bibasilar airspace disease likely reflects atelectasis.  There is no edema or effusion to suggest failure. Emphysematous changes are suggested. The visualized soft tissues and bony thorax are unremarkable. IMPRESSION: 1. Cardiomegaly without failure. 2. Atherosclerosis. 3. Minimal bibasilar airspace disease likely reflects atelectasis. Electronically Signed   By: San Morelle M.D.   On: 06/26/2015 07:34   Dg Chest Portable 1 View  06/25/2015  CLINICAL DATA:  Found unresponsive, shortness of breath EXAM: PORTABLE CHEST 1 VIEW COMPARISON:  06/22/2007 FINDINGS: Cardiac shadow is stable. The lungs are well aerated bilaterally. No pneumothorax or focal infiltrate is seen. No acute fracture is noted. IMPRESSION: No acute abnormality seen. Electronically Signed   By: Inez Catalina M.D.   On: 06/25/2015 08:40   Dg C-arm 1-60 Min  06/29/2015  CLINICAL DATA:  ORIF for repair of a periprosthetic fracture. 30 seconds of fluoro utilized. EXAM: DG C-ARM 61-120 MIN; DG HIP (WITH OR WITHOUT PELVIS) 2-3V RIGHT COMPARISON:  06/25/2015 FINDINGS: Four images show placement of a lateral fixation plate and multiple circumferential wires which reduce the periprosthetic proximal femur fracture components into anatomic alignment. The femoral and acetabular components of the right hip prosthesis appear well seated and aligned. IMPRESSION: Well aligned right proximal femur periprosthetic fracture components following ORIF. Electronically Signed   By: Lajean Manes M.D.   On: 06/29/2015 17:26   Dg Hip Port Unilat With Pelvis 1v Right  06/29/2015  CLINICAL DATA:  Status post open reduction internal fixation of a periprosthetic right femur fracture. Initial encounter. EXAM: DG HIP (WITH OR WITHOUT PELVIS) 1V PORT RIGHT COMPARISON:  Plain films of the right hip 06/25/2015. FINDINGS: New lateral plate with 2 distal screws and cables proximally are seen about a periprosthetic right femur fracture. Right  hip prosthesis is in place. The device is located. Position and alignment of the  patient's fracture are near anatomic. No new abnormality is identified. IMPRESSION: ORIF periprosthetic right femur fracture without evidence of complication. No acute abnormality. Electronically Signed   By: Inge Rise M.D.   On: 06/29/2015 19:11   Dg Hip Unilat With Pelvis 2-3 Views Right  06/29/2015  CLINICAL DATA:  ORIF for repair of a periprosthetic fracture. 30 seconds of fluoro utilized. EXAM: DG C-ARM 61-120 MIN; DG HIP (WITH OR WITHOUT PELVIS) 2-3V RIGHT COMPARISON:  06/25/2015 FINDINGS: Four images show placement of a lateral fixation plate and multiple circumferential wires which reduce the periprosthetic proximal femur fracture components into anatomic alignment. The femoral and acetabular components of the right hip prosthesis appear well seated and aligned. IMPRESSION: Well aligned right proximal femur periprosthetic fracture components following ORIF. Electronically Signed   By: Lajean Manes M.D.   On: 06/29/2015 17:26   Dg Hip Unilat With Pelvis 2-3 Views Right  06/25/2015  CLINICAL DATA:  Right hip pain after fall. Periprosthetic fracture on CT. EXAM: DG HIP (WITH OR WITHOUT PELVIS) 2-3V RIGHT COMPARISON:  CT femur earlier this day. FINDINGS: Right hip arthroplasty, with known periprosthetic fracture about the greater trochanter and femoral stem. This is better characterized on recently performed CT. The displaced distal component is visualized. Femoral head component remains seated in the acetabular cup. Vascular calcifications are seen. IMPRESSION: Known periprosthetic fracture about the femoral stem of the right hip arthroplasty, better characterized on recent CT. Electronically Signed   By: Jeb Levering M.D.   On: 06/25/2015 21:07    Signature  Thurnell Lose M.D on 07/02/2015 at 10:22 AM  Between 7am to 7pm - Pager - [phone], After 7pm go to [email or web address] - password [email or web address]  [phone]   LOS: 7 days

## 2015-07-03 DIAGNOSIS — Z8719 Personal history of other diseases of the digestive system: Secondary | ICD-10-CM | POA: Diagnosis not present

## 2015-07-03 DIAGNOSIS — Z4689 Encounter for fitting and adjustment of other specified devices: Secondary | ICD-10-CM | POA: Diagnosis not present

## 2015-07-03 DIAGNOSIS — Z5181 Encounter for therapeutic drug level monitoring: Secondary | ICD-10-CM | POA: Diagnosis not present

## 2015-07-03 DIAGNOSIS — R1310 Dysphagia, unspecified: Secondary | ICD-10-CM | POA: Diagnosis not present

## 2015-07-03 DIAGNOSIS — N179 Acute kidney failure, unspecified: Secondary | ICD-10-CM | POA: Diagnosis not present

## 2015-07-03 DIAGNOSIS — I82401 Acute embolism and thrombosis of unspecified deep veins of right lower extremity: Secondary | ICD-10-CM | POA: Diagnosis not present

## 2015-07-03 DIAGNOSIS — K2970 Gastritis, unspecified, without bleeding: Secondary | ICD-10-CM | POA: Diagnosis not present

## 2015-07-03 DIAGNOSIS — I82409 Acute embolism and thrombosis of unspecified deep veins of unspecified lower extremity: Secondary | ICD-10-CM | POA: Diagnosis not present

## 2015-07-03 DIAGNOSIS — Z87442 Personal history of urinary calculi: Secondary | ICD-10-CM | POA: Diagnosis not present

## 2015-07-03 DIAGNOSIS — K259 Gastric ulcer, unspecified as acute or chronic, without hemorrhage or perforation: Secondary | ICD-10-CM | POA: Diagnosis not present

## 2015-07-03 DIAGNOSIS — M9701XA Periprosthetic fracture around internal prosthetic right hip joint, initial encounter: Secondary | ICD-10-CM | POA: Diagnosis not present

## 2015-07-03 DIAGNOSIS — R2681 Unsteadiness on feet: Secondary | ICD-10-CM | POA: Diagnosis not present

## 2015-07-03 DIAGNOSIS — J439 Emphysema, unspecified: Secondary | ICD-10-CM | POA: Diagnosis not present

## 2015-07-03 DIAGNOSIS — M810 Age-related osteoporosis without current pathological fracture: Secondary | ICD-10-CM | POA: Diagnosis not present

## 2015-07-03 DIAGNOSIS — E538 Deficiency of other specified B group vitamins: Secondary | ICD-10-CM | POA: Diagnosis not present

## 2015-07-03 DIAGNOSIS — Z96661 Presence of right artificial ankle joint: Secondary | ICD-10-CM | POA: Diagnosis not present

## 2015-07-03 DIAGNOSIS — I5030 Unspecified diastolic (congestive) heart failure: Secondary | ICD-10-CM | POA: Diagnosis not present

## 2015-07-03 DIAGNOSIS — D649 Anemia, unspecified: Secondary | ICD-10-CM | POA: Diagnosis not present

## 2015-07-03 DIAGNOSIS — I4891 Unspecified atrial fibrillation: Secondary | ICD-10-CM | POA: Diagnosis not present

## 2015-07-03 DIAGNOSIS — R278 Other lack of coordination: Secondary | ICD-10-CM | POA: Diagnosis not present

## 2015-07-03 DIAGNOSIS — R1311 Dysphagia, oral phase: Secondary | ICD-10-CM | POA: Diagnosis not present

## 2015-07-03 DIAGNOSIS — S72391D Other fracture of shaft of right femur, subsequent encounter for closed fracture with routine healing: Secondary | ICD-10-CM | POA: Diagnosis not present

## 2015-07-03 DIAGNOSIS — J449 Chronic obstructive pulmonary disease, unspecified: Secondary | ICD-10-CM | POA: Diagnosis not present

## 2015-07-03 DIAGNOSIS — I482 Chronic atrial fibrillation: Secondary | ICD-10-CM | POA: Diagnosis not present

## 2015-07-03 DIAGNOSIS — Z96643 Presence of artificial hip joint, bilateral: Secondary | ICD-10-CM | POA: Diagnosis not present

## 2015-07-03 DIAGNOSIS — Z86718 Personal history of other venous thrombosis and embolism: Secondary | ICD-10-CM | POA: Diagnosis not present

## 2015-07-03 DIAGNOSIS — M1990 Unspecified osteoarthritis, unspecified site: Secondary | ICD-10-CM | POA: Diagnosis not present

## 2015-07-03 DIAGNOSIS — Z86010 Personal history of colonic polyps: Secondary | ICD-10-CM | POA: Diagnosis not present

## 2015-07-03 DIAGNOSIS — A419 Sepsis, unspecified organism: Secondary | ICD-10-CM | POA: Diagnosis not present

## 2015-07-03 DIAGNOSIS — Z7901 Long term (current) use of anticoagulants: Secondary | ICD-10-CM | POA: Diagnosis not present

## 2015-07-03 DIAGNOSIS — S728X9A Other fracture of unspecified femur, initial encounter for closed fracture: Secondary | ICD-10-CM | POA: Diagnosis not present

## 2015-07-03 DIAGNOSIS — C4721 Malignant neoplasm of peripheral nerves of right lower limb, including hip: Secondary | ICD-10-CM | POA: Diagnosis not present

## 2015-07-03 DIAGNOSIS — M6281 Muscle weakness (generalized): Secondary | ICD-10-CM | POA: Diagnosis not present

## 2015-07-03 DIAGNOSIS — D696 Thrombocytopenia, unspecified: Secondary | ICD-10-CM | POA: Diagnosis not present

## 2015-07-03 DIAGNOSIS — Z9889 Other specified postprocedural states: Secondary | ICD-10-CM | POA: Diagnosis not present

## 2015-07-03 DIAGNOSIS — R6520 Severe sepsis without septic shock: Secondary | ICD-10-CM | POA: Diagnosis not present

## 2015-07-03 DIAGNOSIS — I4510 Unspecified right bundle-branch block: Secondary | ICD-10-CM | POA: Diagnosis not present

## 2015-07-03 DIAGNOSIS — S72002A Fracture of unspecified part of neck of left femur, initial encounter for closed fracture: Secondary | ICD-10-CM | POA: Diagnosis not present

## 2015-07-03 DIAGNOSIS — I519 Heart disease, unspecified: Secondary | ICD-10-CM | POA: Diagnosis not present

## 2015-07-03 DIAGNOSIS — Z96641 Presence of right artificial hip joint: Secondary | ICD-10-CM | POA: Diagnosis not present

## 2015-07-03 DIAGNOSIS — M96661 Fracture of femur following insertion of orthopedic implant, joint prosthesis, or bone plate, right leg: Secondary | ICD-10-CM | POA: Diagnosis not present

## 2015-07-03 LAB — CBC
HCT: 26.3 % — ABNORMAL LOW (ref 39.0–52.0)
Hemoglobin: 8.9 g/dL — ABNORMAL LOW (ref 13.0–17.0)
MCH: 32.5 pg (ref 26.0–34.0)
MCHC: 33.8 g/dL (ref 30.0–36.0)
MCV: 96 fL (ref 78.0–100.0)
PLATELETS: 168 10*3/uL (ref 150–400)
RBC: 2.74 MIL/uL — ABNORMAL LOW (ref 4.22–5.81)
RDW: 16 % — ABNORMAL HIGH (ref 11.5–15.5)
WBC: 9.4 10*3/uL (ref 4.0–10.5)

## 2015-07-03 MED ORDER — APIXABAN 2.5 MG PO TABS: 2.5000 mg | ORAL_TABLET | Freq: Two times a day (BID) | ORAL | Status: DC

## 2015-07-03 MED ORDER — SENNOSIDES-DOCUSATE SODIUM 8.6-50 MG PO TABS: 1.0000 | ORAL_TABLET | Freq: Every day | ORAL | Status: AC

## 2015-07-03 NOTE — Clinical Social Work Note (Addendum)
CSW faxed required clinical information to patient's insurance company, awaiting insurance approval for patient to go to SNF.  CSW spoke with patient's family and they would like [facility] for short term rehab.    CSW received insurance approval for patient, his authorization number is [id number].    Patient to be d/c'ed today to [facility] patient and family agreeable to plans will transport via ems RN to call report to [phone] room 310.  Evette Cristal, MSW, [place]

## 2015-07-03 NOTE — Discharge Instructions (Addendum)
1. Change dressings on day 7 after surgery and replace with sterile gauze 2. May shower but keep incisions covered and dry 3. Take stool softeners as needed 4. Take pain meds as needed   Follow with Primary MD Crist Infante A, MD in 7 days   Get CBC, CMP, 2 view Chest X ray checked  by Primary MD next visit.    Activity: As tolerated with Full fall precautions use walker/cane & assistance as needed   Disposition Home     Diet:   Heart Healthy with feeding assistance and aspiration precautions.  For Heart failure patients - Check your Weight same time everyday, if you gain over 2 pounds, or you develop in leg swelling, experience more shortness of breath or chest pain, call your Primary MD immediately. Follow Cardiac Low Salt Diet and 1.5 lit/day fluid restriction.   On your next visit with your primary care physician please Get Medicines reviewed and adjusted.   Please request your Prim.MD to go over all Hospital Tests and Procedure/Radiological results at the follow up, please get all Hospital records sent to your Prim MD by signing hospital release before you go home.   If you experience worsening of your admission symptoms, develop shortness of breath, life threatening emergency, suicidal or homicidal thoughts you must seek medical attention immediately by calling 911 or calling your MD immediately  if symptoms less severe.     Do not take more than prescribed Pain, Sleep and Anxiety Medications  Special Instructions: If you have smoked or chewed Tobacco  in the last 2 yrs please stop smoking, stop any regular Alcohol  and or any Recreational drug use.  Wear Seat belts while driving.    ----------------------------------------------------------------------------  Information on my medicine - ELIQUIS (apixaban)  This medication education was reviewed with me or my healthcare representative as part of my discharge preparation.  The pharmacist that spoke with me during my  hospital stay was:  Arty Baumgartner, [facility]  Why was Eliquis prescribed for you? Eliquis was prescribed for you to reduce the risk of forming blood clots that can cause a stroke if you have a medical condition called atrial fibrillation (a type of irregular heartbeat) OR to reduce the risk of a blood clots forming after orthopedic surgery, OR  for the treatment of blood clots.  What do You need to know about Eliquis ? Take your Eliquis TWICE DAILY - one tablet in the morning and one tablet in the evening with or without food.  It would be best to take the doses about the same time each day.  If you have difficulty swallowing the tablet whole please discuss with your pharmacist how to take the medication safely.  Take Eliquis exactly as prescribed by your doctor and DO NOT stop taking Eliquis without talking to the doctor who prescribed the medication.  Stopping may increase your risk of developing a new clot or stroke.  Refill your prescription before you run out.  After discharge, you should have regular check-up appointments with your healthcare provider that is prescribing your Eliquis.  In the future your dose may need to be changed if your kidney function or weight changes by a significant amount or as you get older.  What do you do if you miss a dose? If you miss a dose, take it as soon as you remember on the same day and resume taking twice daily.  Do not take more than one dose of ELIQUIS at the same time.  Important Safety Information A possible side effect of Eliquis is bleeding. You should call your healthcare provider right away if you experience any of the following: ? Bleeding from an injury or your nose that does not stop. ? Unusual colored urine (red or dark brown) or unusual colored stools (red or black). ? Unusual bruising for unknown reasons. ? A serious fall or if you hit your head (even if there is no bleeding).  Some medicines may interact with Eliquis and  might increase your risk of bleeding or clotting while on Eliquis. To help avoid this, consult your healthcare provider or pharmacist prior to using any new prescription or non-prescription medications, including herbals, vitamins, non-steroidal anti-inflammatory drugs (NSAIDs) and supplements.  This website has more information on Eliquis (apixaban): [email or web address].

## 2015-07-03 NOTE — Progress Notes (Signed)
Physical Therapy Treatment Patient Details Name: Gerald Sandoval MRN: [id number] DOB: Dec 19, 1923 Today's Date: 07/03/2015    History of Present Illness 82 yo male s/p R hip surg after fall in [facility]. Pt with sepsis of unknown etiology acute renal failure and R periprosthetic hip fx. Pt with post op Afib with RVR. PMH: osteoarthritis, rotator cuff injury, kidney stones, colon polyp, anemnia, L hip abscess, gastric ulcer,    PT Comments    Noting better ability to perform RLE AROM/AAROM  Therex, and he seemed overall more confident with abitliy to move; still with difficulty keeping NWB  Follow Up Recommendations  SNF     Equipment Recommendations  Rolling walker with 5" wheels    Recommendations for Other Services       Precautions / Restrictions Precautions Precautions: Fall Restrictions Weight Bearing Restrictions: Yes RLE Weight Bearing: Touchdown weight bearing    Mobility  Bed Mobility Overal bed mobility: Needs Assistance Bed Mobility: Supine to Sit     Supine to sit: +2 for safety/equipment;Mod assist     General bed mobility comments: cues to hand placement, pad used to help pivot to EOB. Pt reaching and helping shift weight. tp required mod(A) to advance R LE  Transfers Overall transfer level: Needs assistance Equipment used: Rolling walker (2 wheeled) Transfers: Sit to/from Gerald Sandoval Sit to Stand: Max assist;+2 physical assistance;+2 safety/equipment   Squat pivot transfers: +2 physical assistance;Mod assist     General transfer comment: Initial transfer attempt for straight sit to stand to RW; Noted good rise, cues for hand placement and safety; Still, pt put a consdierable amount of weight through RLE; Opted then for basic pivot transfer, L knee blocked, to recliner placed on pt's L   Ambulation/Gait                 Stairs            Wheelchair Mobility    Modified Rankin (Stroke Patients Only)        Balance     Sitting balance-Leahy Scale: Fair Sitting balance - Comments: posterior pelvic tilt with kyphotic posture     Standing balance-Leahy Scale: Poor                      Cognition Arousal/Alertness: Awake/alert Behavior During Therapy: WFL for tasks assessed/performed Overall Cognitive Status: Within Functional Limits for tasks assessed                      Exercises Total Joint Exercises Quad Sets: AROM;Right;5 reps Gluteal Sets: AROM;Both;5 reps Towel Squeeze: AROM;Both;5 reps Heel Slides: AAROM;Left;5 reps Hip ABduction/ADduction: AAROM;Left;5 reps    General Comments        Pertinent Vitals/Pain Pain Assessment: Faces Faces Pain Scale: Hurts even more Pain Location: RLE; grimace with motion; states he is not in pain Pain Descriptors / Indicators: Grimacing Pain Intervention(s): Monitored during session;Repositioned    Home Living                      Prior Function            PT Goals (current goals can now be found in the care plan section) Acute Rehab PT Goals Patient Stated Goal: wants to be less stiff PT Goal Formulation: With patient Time For Goal Achievement: 07/07/15 Potential to Achieve Goals: Good Progress towards PT goals: Progressing toward goals    Frequency  Min 5X/week  PT Plan Current plan remains appropriate    Co-evaluation             End of Session Equipment Utilized During Treatment: Gait belt Activity Tolerance: Patient tolerated treatment well Patient left: in chair;with call bell/phone within reach;with chair alarm set;with family/visitor present     Time: [id number] PT Time Calculation (min) (ACUTE ONLY): 25 min  Charges:  $Gait Training: 8-22 mins $Therapeutic Exercise: 8-22 mins                    G Codes:      Gerald Sandoval 07/03/2015, 10:52 AM  Gerald Sandoval, [place] Pager [phone] Office [phone]

## 2015-07-03 NOTE — Progress Notes (Signed)
Report called to [facility]

## 2015-07-03 NOTE — Discharge Summary (Signed)
Gerald Sandoval, is a 82 y.o. male  DOB 10/10/1923  MRN [id number].  Admission date:  06/25/2015  Admitting Physician  Janece Canterbury, MD  Discharge Date:  07/03/2015   Primary MD  Jerlyn Ly, MD  Recommendations for primary care physician for things to follow:   Check CBC, BMP in 3-4 days. Outpatient or throat and PCP follow-up.  He needs to follow with Dr. Kathlene Cote IR for IVC filter removal   Admission Diagnosis  Edema [R60.9] Hyperkalemia [E87.5] Lactic acidosis [E87.2] Leg pain [M79.606] AKI (acute kidney injury) ([place]) [N17.9] Sepsis ([place]) [A41.9] Severe sepsis ([place]) [A41.9, R65.20] Altered mental status, unspecified altered mental status type [R41.82]   Discharge Diagnosis  Edema [R60.9] Hyperkalemia [E87.5] Lactic acidosis [E87.2] Leg pain [M79.606] AKI (acute kidney injury) ([place]) [N17.9] Sepsis ([place]) [A41.9] Severe sepsis ([place]) [A41.9, R65.20] Altered mental status, unspecified altered mental status type [R41.82]     Principal Problem:   Severe sepsis- found down at nursing home Active Problems:   Chronic a-fib ([place])   EMPHYSEMA   Acute renal insufficiency-resolved   History of right hip replacement   Fracture - Rt hip prosthesis this adm   Chronic anticoagulation-Pradaxa prior to admission   RBBB   DVT of Rt lower limb, acute ([place])   S/P IVC filter 06/27/15   S/P Lt hip replacement, chronically infected [facility])   Persistent atrial fibrillation [facility])      Past Medical History  Diagnosis Date  . Permanent atrial fibrillation ([place])   . Osteoarthritis   . Gastritis   . Gastric ulcer   . History of colonic polyps   . Dysphagia     unspecified  . Emphysema   . Diastolic dysfunction   . BPH (benign prostatic hyperplasia)   . Rotator cuff injury   . Hernia     Rt. Inguinial  .  RBBB (right bundle branch block)   . Kidney stones   . Osteoporosis   . Abscess of left hip     infection left hip  . Hypogonadism in male   . Anemia   . Colon polyp     Past Surgical History  Procedure Laterality Date  . Transurethral resection of prostate    . Inguinal hernia repair      RT  . Total hip arthroplasty      Both hips; Left in 2004, Right in 2007  . Resection of ribs    . Orif periprosthetic fracture Right 06/29/2015    Procedure: OPEN REDUCTION INTERNAL FIXATION (ORIF) RIGHT HIP PERIPROSTHETIC FRACTURE;  Surgeon: Leandrew Koyanagi, MD;  Location: [place];  Service: Orthopedics;  Laterality: Right;       HPI  from the history and physical done on the day of admission:   82 year old male with history of atrial fibrillation on chronic anticoagulation, history of chronically infected prosthetic left hip on amoxicillin suppressive therapy, was brought to the hospital on 1/29 after being found down in his room at his independent living facility. He was found to have sepsis  of unknown etiology, acute renal failure, and a right periprosthetic hip fracture. He was managed in the stepdown unit, was placed on empiric antibiotics, IV fluids, orthopedics was consulted. Although sepsis pathophysiology has resolved, Hospital course has been complicated by development of worsening anemia, right lower extremity DVT requiring discontinuation of anticoagulation and placement of IVC filter. Patient was seen by cardiology and deemed to be a high risk candidate for orthopedic surgery.Subsequently underwent open reduction and internal fixation of right periprosthetic hip fracture on 2/2. Postoperatively developed A. fib RVR requiring IV Cardizem infusion-following which heart rate has significantly improved. Remains off anticoagulation-per orthopedics,ok to resume full dose anticoagulation in next 1-2 days.    Hospital Course:     Severe sepsis: Etiology unknown, however sepsis pathophysiology has  resolved. Blood/urine cultures negative. Discontinued vancomycin on 2/1, Zosyn DC'd 2/3-and placed back on amoxicillin (that he takes chronically for right hip infection).  Right hip periprosthetic fracture: Following a mechanical fall, postoperative correction by Dr. Erlinda Hong - Orthopedics on 06/29/2015, has tolerated the procedure well, minimal perioperative blood loss requiring some transfusion initially, per orthopedics if H&H stable could be started on long-term anticoagulation with caution. Placed on Eliquis on the lowest possible dose on 07/02/2015 as H&H remains stable.   Acute on chronic anemia: Suspect acute anemia multifactorial from chronic blood loss, acute illness and renal failure, perioperative blood loss. No overt evidence of GI bleed (FOBT negative this admission) or evidence of retroperitoneal hematoma on CT scan. Status post 4 PRBC transfusion, monitor H&H, was on Pradaxa before but have now placed him on lowest dose eliquis on 07/02/2015 since H&H remains stable and to minimize bleeding risk, has been evaluated by gastroenterology in the past with a virtual colonoscopy September 2016 that was negative.  Acute renal failure: Resolved. Likely secondary to prerenal azotemia in the setting of sepsis.   Right lower extremity DVT: IVC filter placed 1/31.Had significant anemia with suspicion for ongoing chronic blood not a great candidate for long-term anticoagulation-however hemoglobin has now stabilized, no overt blood loss evident-discussed with patient/family risks vs benefits of long term anticoagulation-at this time they're leaning towards restarting anticoagulation. Given DVT have now placed him on Eliquis at lowest recommended dose on 07/02/2015.  Chronic A. fib [id number] Vasc 2 score of >3 seen by cardiology, currently on Lopressor for rate control, was on prior DEXA if stable will be placed on Eliquis instead to minimize bleeding possibilities. Echo shows EF around 50-55%.   History  of infected prosthetic left hip: On chronic suppressive therapy with amoxicillin-no indication of abscess in CT abdomen/pelvis. Was on empiric Vanco/Zosyn- placed back on amoxicillin on 2/4.  Thrombocytopenia:? Due to sepsis and consumption from DVT, improving, monitor.  Minimally elevated troponin: Trend not consistent with ACS. Supportive care. Echo shows preserved ejection fraction seen by Cards.  Mild rhabdomyolysis: Likely secondary to be down prior to admission. CK downtrending with supportive care.   Discharge Condition: Stable  Follow UP  Follow-up Information    Follow up with Marianna Payment, MD In 2 weeks.   Specialty:  Orthopedic Surgery   Why:  For suture removal, For wound re-check   Contact information:   [place] [address] [phone]       Follow up with Crist Infante A, MD. Schedule an appointment as soon as possible for a visit in 1 week.   Specialty:  Internal Medicine   Contact information:   [address] [place] [address] [phone]  Follow up with [facility] T, MD. Schedule an appointment as soon as possible for a visit in 2 weeks.   Specialty:  Interventional Radiology   Why:  IVC filter removal   Contact information:   [address] [place] [address] [phone]        Consults obtained - Ortho  Diet and Activity recommendation: See Discharge Instructions below  Discharge Instructions           Discharge Instructions    Diet - low sodium heart healthy    Complete by:  As directed      Discharge instructions    Complete by:  As directed   1. Change dressings on day 7 after surgery and replace with sterile gauze 2. May shower but keep incisions covered and dry 3. Take stool softeners as needed 4. Take pain meds as needed  Follow with Primary MD Crist Infante A, MD in 7 days   Get CBC, CMP, 2 view Chest X ray checked  by Primary MD next visit.    Activity: As tolerated  with Full fall precautions use walker/cane & assistance as needed   Disposition Home    Diet:   Heart Healthy with feeding assistance and aspiration precautions.  For Heart failure patients - Check your Weight same time everyday, if you gain over 2 pounds, or you develop in leg swelling, experience more shortness of breath or chest pain, call your Primary MD immediately. Follow Cardiac Low Salt Diet and 1.5 lit/day fluid restriction.   On your next visit with your primary care physician please Get Medicines reviewed and adjusted.   Please request your Prim.MD to go over all Hospital Tests and Procedure/Radiological results at the follow up, please get all Hospital records sent to your Prim MD by signing hospital release before you go home.   If you experience worsening of your admission symptoms, develop shortness of breath, life threatening emergency, suicidal or homicidal thoughts you must seek medical attention immediately by calling 911 or calling your MD immediately  if symptoms less severe.  You Must read complete instructions/literature along with all the possible adverse reactions/side effects for all the Medicines you take and that have been prescribed to you. Take any new Medicines after you have completely understood and accpet all the possible adverse reactions/side effects.   Do not drive, operating heavy machinery, perform activities at heights, swimming or participation in water activities or provide baby sitting services if your were admitted for syncope or siezures until you have seen by Primary MD or a Neurologist and advised to do so again.  Do not drive when taking Pain medications.    Do not take more than prescribed Pain, Sleep and Anxiety Medications  Special Instructions: If you have smoked or chewed Tobacco  in the last 2 yrs please stop smoking, stop any regular Alcohol  and or any Recreational drug use.  Wear Seat belts while driving.   Please note  You  were cared for by a hospitalist during your hospital stay. If you have any questions about your discharge medications or the care you received while you were in the hospital after you are discharged, you can call the unit and asked to speak with the hospitalist on call if the hospitalist that took care of you is not available. Once you are discharged, your primary care physician will handle any further medical issues. Please note that NO REFILLS for any discharge medications will be authorized once you are discharged,  as it is imperative that you return to your primary care physician (or establish a relationship with a primary care physician if you do not have one) for your aftercare needs so that they can reassess your need for medications and monitor your lab values.     Increase activity slowly    Complete by:  As directed      Touch down weight bearing    Complete by:  As directed              Discharge Medications       Medication List    STOP taking these medications        PRADAXA 150 MG Caps capsule  Generic drug:  dabigatran      TAKE these medications        amoxicillin 500 MG tablet  Commonly known as:  AMOXIL  Take 500 mg by mouth 2 (two) times daily.     apixaban 2.5 MG Tabs tablet  Commonly known as:  ELIQUIS  Take 1 tablet (2.5 mg total) by mouth 2 (two) times daily.     ergocalciferol 50000 units capsule  Commonly known as:  VITAMIN D2  Take 50,000 Units by mouth as directed.     finasteride 5 MG tablet  Commonly known as:  PROSCAR  Take 5 mg by mouth daily.     HYDROcodone-acetaminophen 7.5-325 MG tablet  Commonly known as:  NORCO  Take 1-2 tablets by mouth every 6 (six) hours as needed for moderate pain.     IRON SUPPLEMENT 325 (65 FE) MG tablet  Generic drug:  ferrous sulfate  Take 325 mg by mouth daily with breakfast.     metoprolol succinate 50 MG 24 hr tablet  Commonly known as:  TOPROL-XL  Take 1.5 tablets (75 mg total) by mouth daily.      potassium phosphate (monobasic) 500 MG tablet  Commonly known as:  K-PHOS ORIGINAL  Take 1,250 mg by mouth daily.     senna-docusate 8.6-50 MG tablet  Commonly known as:  Senokot-S  Take 1 tablet by mouth at bedtime.     sulfamethoxazole-trimethoprim 800-160 MG per tablet  Commonly known as:  BACTRIM DS  Take 1 tablet by mouth 2 (two) times daily.     tamsulosin 0.4 MG Caps capsule  Commonly known as:  FLOMAX  Take 0.4 mg by mouth.     Vitamin B-12 5000 MCG Subl  Place 1 tablet under the tongue daily.        Major procedures and Radiology Reports - PLEASE review detailed and final reports for all details, in brief -    Ct Abdomen Pelvis Wo Contrast  06/26/2015  CLINICAL DATA:  Acute anemia. Evaluate for retroperitoneal bleed. Severe sepsis with acute renal failure. EXAM: CT ABDOMEN AND PELVIS WITHOUT CONTRAST TECHNIQUE: Multidetector CT imaging of the abdomen and pelvis was performed following the standard protocol without IV contrast. COMPARISON:  CT virtual colonoscopy 01/27/2015. Abdominal pelvic CT 11/01/2011. FINDINGS: Lower chest: There are new small dependent low-density pleural effusions bilaterally with associated patchy atelectasis at both lung bases. The heart remains enlarged. Hepatobiliary: As evaluated in the noncontrast state, the liver appears unremarkable. The gallbladder appears unremarkable. Previously noted biliary dilatation appears improved. Pancreas: Appears atrophied. No evidence of focal abnormality or surrounding inflammation. Spleen: Stable appearance with multiple small granulomas. Adrenals/Urinary Tract: Stable without suspicious findings. There is stable subcapsular calcification posteriorly in the mid left kidney and a stable interpolar cyst in the right kidney, measuring  up to 6.2 cm. No evidence of hydronephrosis. Stable tiny calculi in the lower pole of the left kidney. The bladder is partly obscured by artifact from the bilateral total hip arthroplasties,  although demonstrates no abnormality. Stomach/Bowel: No evidence of bowel wall thickening, distention or surrounding inflammatory change. Vascular/Lymphatic: There are no enlarged abdominal or pelvic lymph nodes. There is diffuse atherosclerosis of the aorta, its branches and the iliac arteries. No evidence of retroperitoneal hematoma. Reproductive: Partly obscured by artifact from total hip arthroplasties. Grossly stable. Other: Mild subcutaneous edema within the abdominal wall bilaterally. Apparent asymmetric enlargement of the proximal right thigh without demonstrated focal hematoma. No evidence of hemoperitoneum. Musculoskeletal: No acute or significant osseous findings. Chronic appearing superior endplate compression deformities are noted at T10, T12 and L2. Previous bilateral total hip arthroplasty. IMPRESSION: 1. No evidence of retroperitoneal hematoma. There is mild asymmetric enlargement of the proximal right thigh without apparent hematoma. Correlate clinically. 2. Generalized soft tissue edema with new bilateral pleural effusions suspicious for anasarca. 3. Stable subcapsular calcification posteriorly in the left kidney and right renal cyst. No evidence of hydronephrosis or ureteral calculus. Electronically Signed   By: Richardean Sale M.D.   On: 06/26/2015 21:03   Ct Head Wo Contrast  06/25/2015  CLINICAL DATA:  Found on floor, unwitnessed fall. Head laceration. Altered mental status, confusion EXAM: CT HEAD WITHOUT CONTRAST CT CERVICAL SPINE WITHOUT CONTRAST TECHNIQUE: Multidetector CT imaging of the head and cervical spine was performed following the standard protocol without intravenous contrast. Multiplanar CT image reconstructions of the cervical spine were also generated. COMPARISON:  None. FINDINGS: CT HEAD FINDINGS There is atrophy and chronic small vessel disease changes. No acute intracranial abnormality. Specifically, no hemorrhage, hydrocephalus, mass lesion, acute infarction, or  significant intracranial injury. No acute calvarial abnormality. Visualized paranasal sinuses and mastoids clear. Orbital soft tissues unremarkable. CT CERVICAL SPINE FINDINGS Diffuse degenerative facet disease throughout the cervical spine. Degenerative disc disease in the lower cervical spine. Prevertebral soft tissues are normal. Alignment is normal. No fracture. No epidural or paraspinal hematoma. IMPRESSION: No acute intracranial abnormality. Atrophy, chronic microvascular disease. Degenerative changes in the cervical spine. No acute bony abnormality. Electronically Signed   By: Rolm Baptise M.D.   On: 06/25/2015 09:58   Ct Cervical Spine Wo Contrast  06/25/2015  CLINICAL DATA:  Found on floor, unwitnessed fall. Head laceration. Altered mental status, confusion EXAM: CT HEAD WITHOUT CONTRAST CT CERVICAL SPINE WITHOUT CONTRAST TECHNIQUE: Multidetector CT imaging of the head and cervical spine was performed following the standard protocol without intravenous contrast. Multiplanar CT image reconstructions of the cervical spine were also generated. COMPARISON:  None. FINDINGS: CT HEAD FINDINGS There is atrophy and chronic small vessel disease changes. No acute intracranial abnormality. Specifically, no hemorrhage, hydrocephalus, mass lesion, acute infarction, or significant intracranial injury. No acute calvarial abnormality. Visualized paranasal sinuses and mastoids clear. Orbital soft tissues unremarkable. CT CERVICAL SPINE FINDINGS Diffuse degenerative facet disease throughout the cervical spine. Degenerative disc disease in the lower cervical spine. Prevertebral soft tissues are normal. Alignment is normal. No fracture. No epidural or paraspinal hematoma. IMPRESSION: No acute intracranial abnormality. Atrophy, chronic microvascular disease. Degenerative changes in the cervical spine. No acute bony abnormality. Electronically Signed   By: Rolm Baptise M.D.   On: 06/25/2015 09:58     Dg Chest Port 1  View  06/26/2015  CLINICAL DATA:  Sepsis and edema. EXAM: PORTABLE CHEST - 1 VIEW COMPARISON:  One-view chest 06/25/2015. FINDINGS: The heart is enlarged. Pulmonary arteries  are enlarged. Atherosclerotic changes are again noted at the aorta. Minimal bibasilar airspace disease likely reflects atelectasis. There is no edema or effusion to suggest failure. Emphysematous changes are suggested. The visualized soft tissues and bony thorax are unremarkable. IMPRESSION: 1. Cardiomegaly without failure. 2. Atherosclerosis. 3. Minimal bibasilar airspace disease likely reflects atelectasis. Electronically Signed   By: San Morelle M.D.   On: 06/26/2015 07:34   Dg Chest Portable 1 View  06/25/2015  CLINICAL DATA:  Found unresponsive, shortness of breath EXAM: PORTABLE CHEST 1 VIEW COMPARISON:  06/22/2007 FINDINGS: Cardiac shadow is stable. The lungs are well aerated bilaterally. No pneumothorax or focal infiltrate is seen. No acute fracture is noted. IMPRESSION: No acute abnormality seen. Electronically Signed   By: Inez Catalina M.D.   On: 06/25/2015 08:40   Dg C-arm 1-60 Min  06/29/2015  CLINICAL DATA:  ORIF for repair of a periprosthetic fracture. 30 seconds of fluoro utilized. EXAM: DG C-ARM 61-120 MIN; DG HIP (WITH OR WITHOUT PELVIS) 2-3V RIGHT COMPARISON:  06/25/2015 FINDINGS: Four images show placement of a lateral fixation plate and multiple circumferential wires which reduce the periprosthetic proximal femur fracture components into anatomic alignment. The femoral and acetabular components of the right hip prosthesis appear well seated and aligned. IMPRESSION: Well aligned right proximal femur periprosthetic fracture components following ORIF. Electronically Signed   By: Lajean Manes M.D.   On: 06/29/2015 17:26   Dg Hip Port Unilat With Pelvis 1v Right  06/29/2015  CLINICAL DATA:  Status post open reduction internal fixation of a periprosthetic right femur fracture. Initial encounter. EXAM: DG HIP (WITH  OR WITHOUT PELVIS) 1V PORT RIGHT COMPARISON:  Plain films of the right hip 06/25/2015. FINDINGS: New lateral plate with 2 distal screws and cables proximally are seen about a periprosthetic right femur fracture. Right hip prosthesis is in place. The device is located. Position and alignment of the patient's fracture are near anatomic. No new abnormality is identified. IMPRESSION: ORIF periprosthetic right femur fracture without evidence of complication. No acute abnormality. Electronically Signed   By: Inge Rise M.D.   On: 06/29/2015 19:11   Dg Hip Unilat With Pelvis 2-3 Views Right  06/29/2015  CLINICAL DATA:  ORIF for repair of a periprosthetic fracture. 30 seconds of fluoro utilized. EXAM: DG C-ARM 61-120 MIN; DG HIP (WITH OR WITHOUT PELVIS) 2-3V RIGHT COMPARISON:  06/25/2015 FINDINGS: Four images show placement of a lateral fixation plate and multiple circumferential wires which reduce the periprosthetic proximal femur fracture components into anatomic alignment. The femoral and acetabular components of the right hip prosthesis appear well seated and aligned. IMPRESSION: Well aligned right proximal femur periprosthetic fracture components following ORIF. Electronically Signed   By: Lajean Manes M.D.   On: 06/29/2015 17:26   Dg Hip Unilat With Pelvis 2-3 Views Right  06/25/2015  CLINICAL DATA:  Right hip pain after fall. Periprosthetic fracture on CT. EXAM: DG HIP (WITH OR WITHOUT PELVIS) 2-3V RIGHT COMPARISON:  CT femur earlier this day. FINDINGS: Right hip arthroplasty, with known periprosthetic fracture about the greater trochanter and femoral stem. This is better characterized on recently performed CT. The displaced distal component is visualized. Femoral head component remains seated in the acetabular cup. Vascular calcifications are seen. IMPRESSION: Known periprosthetic fracture about the femoral stem of the right hip arthroplasty, better characterized on recent CT. Electronically Signed   By:  Jeb Levering M.D.   On: 06/25/2015 21:07    Micro Results      Recent Results (from  the past 240 hour(s))  Culture, blood (routine x 2)     Status: None   Collection Time: 06/25/15  8:25 AM  Result Value Ref Range Status   Specimen Description BLOOD LEFT ANTECUBITAL  Final   Special Requests   Final    BOTTLES DRAWN AEROBIC AND ANAEROBIC 5CC ANA [facility] AER   Culture NO GROWTH 5 DAYS  Final   Report Status 06/30/2015 FINAL  Final  Culture, blood (routine x 2)     Status: None   Collection Time: 06/25/15  8:30 AM  Result Value Ref Range Status   Specimen Description BLOOD RIGHT HAND  Final   Special Requests BOTTLES DRAWN AEROBIC ONLY 5CC  Final   Culture NO GROWTH 5 DAYS  Final   Report Status 06/30/2015 FINAL  Final  Urine culture     Status: None   Collection Time: 06/25/15  9:11 AM  Result Value Ref Range Status   Specimen Description URINE, CATHETERIZED  Final   Special Requests NONE  Final   Culture NO GROWTH 1 DAY  Final   Report Status 06/26/2015 FINAL  Final  MRSA PCR Screening     Status: None   Collection Time: 06/25/15  3:28 PM  Result Value Ref Range Status   MRSA by PCR NEGATIVE NEGATIVE Final    Comment:        The GeneXpert MRSA Assay (FDA approved for NASAL specimens only), is one component of a comprehensive MRSA colonization surveillance program. It is not intended to diagnose MRSA infection nor to guide or monitor treatment for MRSA infections.   Urine culture     Status: None   Collection Time: 06/27/15  1:50 AM  Result Value Ref Range Status   Specimen Description URINE, CATHETERIZED  Final   Special Requests NONE  Final   Culture NO GROWTH 1 DAY  Final   Report Status 06/28/2015 FINAL  Final       Today   Subjective    Gerald Sandoval today has no headache,no chest abdominal pain,no new weakness tingling or numbness, feels much better wants to go home today.     Objective   Blood pressure 107/65, pulse 81, temperature 99.4 F (37.4  C), temperature source Oral, resp. rate 18, height 5\' 8"  (1.727 m), weight 69.5 kg (153 lb 3.5 oz), SpO2 91 %.   Intake/Output Summary (Last 24 hours) at 07/03/15 1149 Last data filed at 07/03/15 1106  Gross per 24 hour  Intake      0 ml  Output   2954 ml  Net  -2954 ml    Exam Awake Alert, Oriented x 3, No new F.N deficits, Normal affect .AT,PERRAL Supple Neck,No JVD, No cervical lymphadenopathy appriciated.  Symmetrical Chest wall movement, Good air movement bilaterally, CTAB RRR,No Gallops,Rubs or new Murmurs, No Parasternal Heave +ve B.Sounds, Abd Soft, Non tender, No organomegaly appriciated, No rebound -guarding or rigidity. No Cyanosis, Clubbing or edema, No new Rash or bruise, R hip scar site has a small hematoma which is stable   Data Review   CBC w Diff:  Lab Results  Component Value Date   WBC 9.4 07/03/2015   HGB 8.9* 07/03/2015   HCT 26.3* 07/03/2015   PLT 168 07/03/2015   LYMPHOPCT 3 06/25/2015   MONOPCT 6 06/25/2015   EOSPCT 0 06/25/2015   BASOPCT 0 06/25/2015    CMP:  Lab Results  Component Value Date   NA 135 07/01/2015   K 3.7 07/01/2015   CL  99* 07/01/2015   CO2 26 07/01/2015   BUN 10 07/01/2015   CREATININE 0.77 07/01/2015   CREATININE 1.28 12/10/2010   PROT 4.3* 06/30/2015   ALBUMIN 2.2* 06/30/2015   BILITOT 1.2 06/30/2015   ALKPHOS 39 06/30/2015   AST 36 06/30/2015   ALT 21 06/30/2015  .   Total Time in preparing paper work, data evaluation and todays exam - 35 minutes  Thurnell Lose M.D on 07/03/2015 at 11:49 AM  [facility]   Office  [phone]

## 2015-07-03 NOTE — Progress Notes (Signed)
Patient is sitting in bed, comfortable.  Dressing has scant drainage but no signs of infection or active drainage. Thigh is soft, no redness Continue with PT TDWB RLE eliquis for DVT F/u 2 weeks in office  N. Eduard Roux, MD [place] 10:10 AM

## 2015-07-04 ENCOUNTER — Other Ambulatory Visit: Payer: Self-pay | Admitting: Interventional Radiology

## 2015-07-04 ENCOUNTER — Encounter (HOSPITAL_COMMUNITY): Payer: Self-pay | Admitting: Orthopaedic Surgery

## 2015-07-04 DIAGNOSIS — N179 Acute kidney failure, unspecified: Secondary | ICD-10-CM | POA: Diagnosis not present

## 2015-07-04 DIAGNOSIS — I82401 Acute embolism and thrombosis of unspecified deep veins of right lower extremity: Secondary | ICD-10-CM

## 2015-07-06 DIAGNOSIS — R6520 Severe sepsis without septic shock: Secondary | ICD-10-CM | POA: Diagnosis not present

## 2015-07-06 DIAGNOSIS — C4721 Malignant neoplasm of peripheral nerves of right lower limb, including hip: Secondary | ICD-10-CM | POA: Diagnosis not present

## 2015-07-06 DIAGNOSIS — I4510 Unspecified right bundle-branch block: Secondary | ICD-10-CM | POA: Diagnosis not present

## 2015-07-06 DIAGNOSIS — M9701XA Periprosthetic fracture around internal prosthetic right hip joint, initial encounter: Secondary | ICD-10-CM | POA: Diagnosis not present

## 2015-07-07 DIAGNOSIS — N179 Acute kidney failure, unspecified: Secondary | ICD-10-CM | POA: Diagnosis not present

## 2015-07-07 DIAGNOSIS — A419 Sepsis, unspecified organism: Secondary | ICD-10-CM | POA: Diagnosis not present

## 2015-07-07 DIAGNOSIS — I5030 Unspecified diastolic (congestive) heart failure: Secondary | ICD-10-CM | POA: Diagnosis not present

## 2015-07-07 DIAGNOSIS — E538 Deficiency of other specified B group vitamins: Secondary | ICD-10-CM | POA: Diagnosis not present

## 2015-07-07 DIAGNOSIS — S72002A Fracture of unspecified part of neck of left femur, initial encounter for closed fracture: Secondary | ICD-10-CM | POA: Diagnosis not present

## 2015-07-07 DIAGNOSIS — K259 Gastric ulcer, unspecified as acute or chronic, without hemorrhage or perforation: Secondary | ICD-10-CM | POA: Diagnosis not present

## 2015-07-07 DIAGNOSIS — D649 Anemia, unspecified: Secondary | ICD-10-CM | POA: Diagnosis not present

## 2015-07-07 DIAGNOSIS — J449 Chronic obstructive pulmonary disease, unspecified: Secondary | ICD-10-CM | POA: Diagnosis not present

## 2015-07-07 DIAGNOSIS — R1310 Dysphagia, unspecified: Secondary | ICD-10-CM | POA: Diagnosis not present

## 2015-07-07 DIAGNOSIS — I4891 Unspecified atrial fibrillation: Secondary | ICD-10-CM | POA: Diagnosis not present

## 2015-07-12 DIAGNOSIS — I82409 Acute embolism and thrombosis of unspecified deep veins of unspecified lower extremity: Secondary | ICD-10-CM | POA: Diagnosis not present

## 2015-07-12 DIAGNOSIS — D696 Thrombocytopenia, unspecified: Secondary | ICD-10-CM | POA: Diagnosis not present

## 2015-07-12 DIAGNOSIS — M9701XA Periprosthetic fracture around internal prosthetic right hip joint, initial encounter: Secondary | ICD-10-CM | POA: Diagnosis not present

## 2015-07-17 DIAGNOSIS — S72391D Other fracture of shaft of right femur, subsequent encounter for closed fracture with routine healing: Secondary | ICD-10-CM | POA: Diagnosis not present

## 2015-07-19 DIAGNOSIS — I4510 Unspecified right bundle-branch block: Secondary | ICD-10-CM | POA: Diagnosis not present

## 2015-07-19 DIAGNOSIS — N179 Acute kidney failure, unspecified: Secondary | ICD-10-CM | POA: Diagnosis not present

## 2015-07-19 DIAGNOSIS — D649 Anemia, unspecified: Secondary | ICD-10-CM | POA: Diagnosis not present

## 2015-07-19 DIAGNOSIS — M9701XA Periprosthetic fracture around internal prosthetic right hip joint, initial encounter: Secondary | ICD-10-CM | POA: Diagnosis not present

## 2015-07-20 ENCOUNTER — Ambulatory Visit
Admission: RE | Admit: 2015-07-20 | Discharge: 2015-07-20 | Disposition: A | Discharge status: Home / Self Care | Payer: Medicare Other | Source: Ambulatory Visit | Attending: Interventional Radiology | Admitting: Interventional Radiology

## 2015-07-20 ENCOUNTER — Other Ambulatory Visit (HOSPITAL_COMMUNITY): Payer: Self-pay | Admitting: Interventional Radiology

## 2015-07-20 DIAGNOSIS — Z86718 Personal history of other venous thrombosis and embolism: Secondary | ICD-10-CM | POA: Diagnosis not present

## 2015-07-20 DIAGNOSIS — I82401 Acute embolism and thrombosis of unspecified deep veins of right lower extremity: Secondary | ICD-10-CM

## 2015-07-20 DIAGNOSIS — I824Y9 Acute embolism and thrombosis of unspecified deep veins of unspecified proximal lower extremity: Secondary | ICD-10-CM

## 2015-07-21 ENCOUNTER — Other Ambulatory Visit: Payer: Self-pay | Admitting: Radiology

## 2015-07-21 ENCOUNTER — Other Ambulatory Visit: Payer: Self-pay | Admitting: General Surgery

## 2015-07-21 DIAGNOSIS — I4510 Unspecified right bundle-branch block: Secondary | ICD-10-CM | POA: Diagnosis not present

## 2015-07-21 DIAGNOSIS — M9701XA Periprosthetic fracture around internal prosthetic right hip joint, initial encounter: Secondary | ICD-10-CM | POA: Diagnosis not present

## 2015-07-21 DIAGNOSIS — C4721 Malignant neoplasm of peripheral nerves of right lower limb, including hip: Secondary | ICD-10-CM | POA: Diagnosis not present

## 2015-07-21 DIAGNOSIS — I82409 Acute embolism and thrombosis of unspecified deep veins of unspecified lower extremity: Secondary | ICD-10-CM | POA: Diagnosis not present

## 2015-07-24 ENCOUNTER — Ambulatory Visit (HOSPITAL_COMMUNITY)
Admission: RE | Admit: 2015-07-24 | Discharge: 2015-07-24 | Disposition: A | Discharge status: Home / Self Care | Payer: Medicare Other | Source: Ambulatory Visit | Attending: Interventional Radiology | Admitting: Interventional Radiology

## 2015-07-24 ENCOUNTER — Encounter (HOSPITAL_COMMUNITY): Payer: Self-pay

## 2015-07-24 DIAGNOSIS — M1990 Unspecified osteoarthritis, unspecified site: Secondary | ICD-10-CM | POA: Diagnosis not present

## 2015-07-24 DIAGNOSIS — D649 Anemia, unspecified: Secondary | ICD-10-CM | POA: Diagnosis not present

## 2015-07-24 DIAGNOSIS — I4510 Unspecified right bundle-branch block: Secondary | ICD-10-CM | POA: Insufficient documentation

## 2015-07-24 DIAGNOSIS — Z4689 Encounter for fitting and adjustment of other specified devices: Secondary | ICD-10-CM | POA: Insufficient documentation

## 2015-07-24 DIAGNOSIS — Z7901 Long term (current) use of anticoagulants: Secondary | ICD-10-CM | POA: Insufficient documentation

## 2015-07-24 DIAGNOSIS — M810 Age-related osteoporosis without current pathological fracture: Secondary | ICD-10-CM | POA: Insufficient documentation

## 2015-07-24 DIAGNOSIS — Z9889 Other specified postprocedural states: Secondary | ICD-10-CM | POA: Insufficient documentation

## 2015-07-24 DIAGNOSIS — Z96643 Presence of artificial hip joint, bilateral: Secondary | ICD-10-CM | POA: Diagnosis not present

## 2015-07-24 DIAGNOSIS — I482 Chronic atrial fibrillation: Secondary | ICD-10-CM | POA: Insufficient documentation

## 2015-07-24 DIAGNOSIS — J439 Emphysema, unspecified: Secondary | ICD-10-CM | POA: Insufficient documentation

## 2015-07-24 DIAGNOSIS — Z87442 Personal history of urinary calculi: Secondary | ICD-10-CM | POA: Insufficient documentation

## 2015-07-24 DIAGNOSIS — I82401 Acute embolism and thrombosis of unspecified deep veins of right lower extremity: Secondary | ICD-10-CM | POA: Diagnosis not present

## 2015-07-24 DIAGNOSIS — Z8719 Personal history of other diseases of the digestive system: Secondary | ICD-10-CM | POA: Diagnosis not present

## 2015-07-24 DIAGNOSIS — Z86010 Personal history of colonic polyps: Secondary | ICD-10-CM | POA: Diagnosis not present

## 2015-07-24 DIAGNOSIS — K2970 Gastritis, unspecified, without bleeding: Secondary | ICD-10-CM | POA: Diagnosis not present

## 2015-07-24 DIAGNOSIS — Z86718 Personal history of other venous thrombosis and embolism: Secondary | ICD-10-CM | POA: Diagnosis not present

## 2015-07-24 DIAGNOSIS — I824Y9 Acute embolism and thrombosis of unspecified deep veins of unspecified proximal lower extremity: Secondary | ICD-10-CM

## 2015-07-24 LAB — BASIC METABOLIC PANEL
Anion gap: 11 (ref 5–15)
BUN: 11 mg/dL (ref 6–20)
CALCIUM: 8.9 mg/dL (ref 8.9–10.3)
CO2: 28 mmol/L (ref 22–32)
CREATININE: 0.79 mg/dL (ref 0.61–1.24)
Chloride: 101 mmol/L (ref 101–111)
GFR calc Af Amer: >60 mL/min (ref >60)
GLUCOSE: 107 mg/dL — AB (ref 65–99)
POTASSIUM: 4 mmol/L (ref 3.5–5.1)
SODIUM: 140 mmol/L (ref 135–145)

## 2015-07-24 LAB — CBC WITH DIFFERENTIAL/PLATELET
BASOS ABS: 0 10*3/uL (ref 0.0–0.1)
BASOS PCT: 0 %
EOS ABS: 0.2 10*3/uL (ref 0.0–0.7)
EOS PCT: 3 %
HCT: 39.3 % (ref 39.0–52.0)
Hemoglobin: 12.8 g/dL — ABNORMAL LOW (ref 13.0–17.0)
LYMPHS PCT: 25 %
Lymphs Abs: 1.8 10*3/uL (ref 0.7–4.0)
MCH: 32.8 pg (ref 26.0–34.0)
MCHC: 32.6 g/dL (ref 30.0–36.0)
MCV: 100.8 fL — AB (ref 78.0–100.0)
MONO ABS: 0.8 10*3/uL (ref 0.1–1.0)
Monocytes Relative: 11 %
Neutro Abs: 4.5 10*3/uL (ref 1.7–7.7)
Neutrophils Relative %: 61 %
PLATELETS: 156 10*3/uL (ref 150–400)
RBC: 3.9 MIL/uL — AB (ref 4.22–5.81)
RDW: 16.6 % — AB (ref 11.5–15.5)
WBC: 7.4 10*3/uL (ref 4.0–10.5)

## 2015-07-24 LAB — PROTIME-INR
INR: 1.14 (ref 0.00–1.49)
Prothrombin Time: 14.8 seconds (ref 11.6–15.2)

## 2015-07-24 MED ORDER — SODIUM CHLORIDE 0.9 % IV SOLN: INTRAVENOUS | Status: DC

## 2015-07-24 MED ORDER — MIDAZOLAM HCL 2 MG/2ML IJ SOLN
INTRAMUSCULAR | Status: AC | PRN
Start: 2015-07-24 — End: 2015-07-24
  Administered 2015-07-24: 0.5 mg via INTRAVENOUS

## 2015-07-24 MED ORDER — FENTANYL CITRATE (PF) 100 MCG/2ML IJ SOLN
INTRAMUSCULAR | Status: AC
  Filled 2015-07-24: qty 2

## 2015-07-24 MED ORDER — IOHEXOL 300 MG/ML  SOLN
100.0000 mL | Freq: Once | INTRAMUSCULAR | Status: AC | PRN
  Administered 2015-07-24: 50 mL via INTRAVENOUS

## 2015-07-24 MED ORDER — FENTANYL CITRATE (PF) 100 MCG/2ML IJ SOLN
INTRAMUSCULAR | Status: AC | PRN
  Administered 2015-07-24: 25 ug via INTRAVENOUS

## 2015-07-24 MED ORDER — LIDOCAINE HCL 1 % IJ SOLN
INTRAMUSCULAR | Status: AC
  Filled 2015-07-24: qty 20

## 2015-07-24 MED ORDER — MIDAZOLAM HCL 2 MG/2ML IJ SOLN
INTRAMUSCULAR | Status: AC
  Filled 2015-07-24: qty 2

## 2015-07-24 NOTE — Sedation Documentation (Signed)
Patient is resting comfortably. 

## 2015-07-24 NOTE — Discharge Instructions (Signed)
Incision Care An incision is when a surgeon cuts into your body. After surgery, the incision needs to be cared for properly to prevent infection.  HOW TO CARE FOR YOUR INCISION  Take medicines only as directed by your health care provider.  Do not take baths, swim, or use a hot tub until your health care provider approves. You may shower as directed by your health care provider.  Resume your normal diet and activities as directed.  Use anti-itch medicine (such as an antihistamine) as directed by your health care provider. The incision may itch while it is healing. Do not pick or scratch at the incision.  Drink enough fluid to keep your urine clear or pale yellow. SEEK MEDICAL CARE IF:   You have drainage, redness, swelling, or pain at your incision site.  You have muscle aches, chills, or a general ill feeling.  You notice a bad smell coming from the incision or dressing.  Your incision edges separate after the sutures, staples, or skin adhesive strips have been removed.  You have persistent nausea or vomiting.  You have a fever.  You are dizzy. SEEK IMMEDIATE MEDICAL CARE IF:   You have a rash.  You faint.  You have difficulty breathing. MAKE SURE YOU:   Understand these instructions.  Will watch your condition.  Will get help right away if you are not doing well or get worse.   This information is not intended to replace advice given to you by your health care provider. Make sure you discuss any questions you have with your health care provider.   Document Released: 11/30/2004 Document Revised: 06/03/2014 Document Reviewed: 07/07/2013 Elsevier Interactive Patient Education [employer].

## 2015-07-24 NOTE — Procedures (Signed)
Successful IVC FILTER REMOVAL NO COMP STABLE EBL 0 FULL REPORT IN PACS

## 2015-07-24 NOTE — Progress Notes (Signed)
RT neck dressing saturated on arrival to [facility]. Pressure held to site for 8 minutes. Gauze dressing applied. Site U.

## 2015-07-24 NOTE — H&P (Signed)
Chief Complaint: Patient was seen in consultation today for IVC filter retrieval at the request of Dr Erlinda Hong  Referring Physician(s): Dr Solon Palm  History of Present Illness: Gerald Sandoval is a 82 y.o. male   CLINICAL DATA: Right femur fracture and right lower extremity DVT. Prior to planned orthopedic surgery, request is been made to place an IVC filter.  Retrievable Inferior vena cava filter placed 06/27/2015 OR with Dr Erlinda Hong for Rt hip fracture 06/29/2015 On Eliquis now RLE venous doppler 07/20/2015:  IMPRESSION: Sonographic survey of the right lower extremity negative for DVT.  Dilated and tortuous great saphenous vein with associated varicosities, potentially indicating venous reflux.  Now scheduled filter retrieval  Past Medical History  Diagnosis Date  . Permanent atrial fibrillation ([place])   . Osteoarthritis   . Gastritis   . Gastric ulcer   . History of colonic polyps   . Dysphagia     unspecified  . Emphysema   . Diastolic dysfunction   . BPH (benign prostatic hyperplasia)   . Rotator cuff injury   . Hernia     Rt. Inguinial  . RBBB (right bundle branch block)   . Kidney stones   . Osteoporosis   . Abscess of left hip     infection left hip  . Hypogonadism in male   . Anemia   . Colon polyp     Past Surgical History  Procedure Laterality Date  . Transurethral resection of prostate    . Inguinal hernia repair      RT  . Total hip arthroplasty      Both hips; Left in 2004, Right in 2007  . Resection of ribs    . Orif periprosthetic fracture Right 06/29/2015    Procedure: OPEN REDUCTION INTERNAL FIXATION (ORIF) RIGHT HIP PERIPROSTHETIC FRACTURE;  Surgeon: Leandrew Koyanagi, MD;  Location: [facility];  Service: Orthopedics;  Laterality: Right;    Allergies: Primidone  Medications: Prior to Admission medications   Medication Sig Start Date End Date Taking? Authorizing Provider  amoxicillin (AMOXIL) 500 MG tablet Take 500 mg by mouth 2 (two)  times daily.     Yes Historical Provider, MD  apixaban (ELIQUIS) 2.5 MG TABS tablet Take 1 tablet (2.5 mg total) by mouth 2 (two) times daily. 07/03/15  Yes Thurnell Lose, MD  Cyanocobalamin (VITAMIN B-12) 5000 MCG SUBL Place 1 tablet under the tongue daily.     Yes Historical Provider, MD  ergocalciferol (VITAMIN D2) 50000 UNITS capsule Take 50,000 Units by mouth once a week.    Yes Historical Provider, MD  ferrous sulfate (IRON SUPPLEMENT) 325 (65 FE) MG tablet Take 325 mg by mouth daily with breakfast.     Yes Historical Provider, MD  finasteride (PROSCAR) 5 MG tablet Take 5 mg by mouth daily.     Yes Historical Provider, MD  HYDROcodone-acetaminophen (NORCO) 7.5-325 MG tablet Take 1-2 tablets by mouth every 6 (six) hours as needed for moderate pain. 06/29/15  Yes Leandrew Koyanagi, MD  metoprolol (TOPROL-XL) 50 MG 24 hr tablet Take 1.5 tablets (75 mg total) by mouth daily. 04/15/11  Yes Thompson Grayer, MD  potassium phosphate, monobasic, (K-PHOS ORIGINAL) 500 MG tablet Take 1,250 mg by mouth daily.     Yes Historical Provider, MD  senna-docusate (SENOKOT-S) 8.6-50 MG tablet Take 1 tablet by mouth at bedtime. 07/03/15  Yes Thurnell Lose, MD  sulfamethoxazole-trimethoprim (BACTRIM DS) 800-160 MG per tablet Take 1 tablet by mouth 2 (  two) times daily.     Yes Historical Provider, MD  tamsulosin (FLOMAX) 0.4 MG CAPS capsule Take 0.4 mg by mouth.   Yes Historical Provider, MD     Family History  Problem Relation Age of Onset  . Colon cancer Maternal Uncle   . Diabetes Maternal Aunt     Social History   Social History  . Marital Status: Single    Spouse Name: N/A  . Number of Children: 0  . Years of Education: N/A   Occupational History  . Retired: [employer]    Social History Main Topics  . Smoking status: Never Smoker   . Smokeless tobacco: Never Used  . Alcohol Use: No  . Drug Use: No  . Sexual Activity: Not Asked   Other Topics Concern  . None   Social History Narrative    Patient lives in [place] alone.   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   His closest living relative is his sister who is 34 and lives in nursing home.   No caffeine   Retired [employer]: A 12 point ROS discussed and pertinent positives are indicated in the HPI above.  All other systems are negative.  Review of Systems  Constitutional: Negative for activity change and appetite change.  HENT: Positive for hearing loss.   Eyes: Negative for visual disturbance.  Cardiovascular: Negative for chest pain.  Musculoskeletal: Positive for gait problem.  Neurological: Positive for weakness.  Psychiatric/Behavioral: Negative for behavioral problems and confusion.    Vital Signs: BP 140/92 mmHg  Pulse 70  Temp(Src) 97.5 F (36.4 C) (Oral)  Resp 16  Ht 5\' 8"  (1.727 m)  Wt 148 lb (67.132 kg)  BMI 22.51 kg/m2  SpO2 100%  Physical Exam  Constitutional: He is oriented to person, place, and time.  Cardiovascular:  Irreg rate  Pulmonary/Chest: Effort normal. He has no wheezes.  Abdominal: Soft. Bowel sounds are normal. There is no tenderness.  Musculoskeletal: Normal range of motion.  Neurological: He is alert and oriented to person, place, and time.  Skin: Skin is warm and dry.  Psychiatric: He has a normal mood and affect. His behavior is normal. Judgment and thought content normal.  Nursing note and vitals reviewed.   Mallampati Score:  MD Evaluation Airway: WNL Heart: WNL Abdomen: WNL Chest/ Lungs: WNL ASA  Classification: 3 Mallampati/Airway Score: One  Imaging: Ct Abdomen Pelvis Wo Contrast  06/26/2015  CLINICAL DATA:  Acute anemia. Evaluate for retroperitoneal bleed. Severe sepsis with acute renal failure. EXAM: CT ABDOMEN AND PELVIS WITHOUT CONTRAST TECHNIQUE: Multidetector CT imaging of the abdomen and pelvis was performed following the standard protocol without IV contrast. COMPARISON:  CT  virtual colonoscopy 01/27/2015. Abdominal pelvic CT 11/01/2011. FINDINGS: Lower chest: There are new small dependent low-density pleural effusions bilaterally with associated patchy atelectasis at both lung bases. The heart remains enlarged. Hepatobiliary: As evaluated in the noncontrast state, the liver appears unremarkable. The gallbladder appears unremarkable. Previously noted biliary dilatation appears improved. Pancreas: Appears atrophied. No evidence of focal abnormality or surrounding inflammation. Spleen: Stable appearance with multiple small granulomas. Adrenals/Urinary Tract: Stable without suspicious findings. There is stable subcapsular calcification posteriorly in the mid left kidney and a stable interpolar cyst in the right kidney, measuring up to 6.2 cm. No evidence of hydronephrosis. Stable tiny calculi in the lower pole of the left kidney. The bladder is partly obscured by artifact  from the bilateral total hip arthroplasties, although demonstrates no abnormality. Stomach/Bowel: No evidence of bowel wall thickening, distention or surrounding inflammatory change. Vascular/Lymphatic: There are no enlarged abdominal or pelvic lymph nodes. There is diffuse atherosclerosis of the aorta, its branches and the iliac arteries. No evidence of retroperitoneal hematoma. Reproductive: Partly obscured by artifact from total hip arthroplasties. Grossly stable. Other: Mild subcutaneous edema within the abdominal wall bilaterally. Apparent asymmetric enlargement of the proximal right thigh without demonstrated focal hematoma. No evidence of hemoperitoneum. Musculoskeletal: No acute or significant osseous findings. Chronic appearing superior endplate compression deformities are noted at T10, T12 and L2. Previous bilateral total hip arthroplasty. IMPRESSION: 1. No evidence of retroperitoneal hematoma. There is mild asymmetric enlargement of the proximal right thigh without apparent hematoma. Correlate clinically. 2.  Generalized soft tissue edema with new bilateral pleural effusions suspicious for anasarca. 3. Stable subcapsular calcification posteriorly in the left kidney and right renal cyst. No evidence of hydronephrosis or ureteral calculus. Electronically Signed   By: Richardean Sale M.D.   On: 06/26/2015 21:03   Ct Head Wo Contrast  06/25/2015  CLINICAL DATA:  Found on floor, unwitnessed fall. Head laceration. Altered mental status, confusion EXAM: CT HEAD WITHOUT CONTRAST CT CERVICAL SPINE WITHOUT CONTRAST TECHNIQUE: Multidetector CT imaging of the head and cervical spine was performed following the standard protocol without intravenous contrast. Multiplanar CT image reconstructions of the cervical spine were also generated. COMPARISON:  None. FINDINGS: CT HEAD FINDINGS There is atrophy and chronic small vessel disease changes. No acute intracranial abnormality. Specifically, no hemorrhage, hydrocephalus, mass lesion, acute infarction, or significant intracranial injury. No acute calvarial abnormality. Visualized paranasal sinuses and mastoids clear. Orbital soft tissues unremarkable. CT CERVICAL SPINE FINDINGS Diffuse degenerative facet disease throughout the cervical spine. Degenerative disc disease in the lower cervical spine. Prevertebral soft tissues are normal. Alignment is normal. No fracture. No epidural or paraspinal hematoma. IMPRESSION: No acute intracranial abnormality. Atrophy, chronic microvascular disease. Degenerative changes in the cervical spine. No acute bony abnormality. Electronically Signed   By: Rolm Baptise M.D.   On: 06/25/2015 09:58   Ct Cervical Spine Wo Contrast  06/25/2015  CLINICAL DATA:  Found on floor, unwitnessed fall. Head laceration. Altered mental status, confusion EXAM: CT HEAD WITHOUT CONTRAST CT CERVICAL SPINE WITHOUT CONTRAST TECHNIQUE: Multidetector CT imaging of the head and cervical spine was performed following the standard protocol without intravenous contrast.  Multiplanar CT image reconstructions of the cervical spine were also generated. COMPARISON:  None. FINDINGS: CT HEAD FINDINGS There is atrophy and chronic small vessel disease changes. No acute intracranial abnormality. Specifically, no hemorrhage, hydrocephalus, mass lesion, acute infarction, or significant intracranial injury. No acute calvarial abnormality. Visualized paranasal sinuses and mastoids clear. Orbital soft tissues unremarkable. CT CERVICAL SPINE FINDINGS Diffuse degenerative facet disease throughout the cervical spine. Degenerative disc disease in the lower cervical spine. Prevertebral soft tissues are normal. Alignment is normal. No fracture. No epidural or paraspinal hematoma. IMPRESSION: No acute intracranial abnormality. Atrophy, chronic microvascular disease. Degenerative changes in the cervical spine. No acute bony abnormality. Electronically Signed   By: Rolm Baptise M.D.   On: 06/25/2015 09:58   Ir Ivc Filter Plmt / S&i /img Guid/mod Sed  06/27/2015  CLINICAL DATA:  Right femur fracture and right lower extremity DVT. Prior to planned orthopedic surgery, request is been made to place an IVC filter. EXAM: 1. ULTRASOUND GUIDANCE FOR VASCULAR ACCESS OF THE RIGHT INTERNAL JUGULAR VEIN. 2. IVC VENOGRAM. 3. PERCUTANEOUS IVC FILTER PLACEMENT. ANESTHESIA/SEDATION: 50 mcg  IV Fentanyl. Total Moderate Sedation Time 15 minutes. The patient's level of consciousness and physiologic status were continuously monitored during the procedure by Radiology nursing. CONTRAST:  63mL OMNIPAQUE IOHEXOL 300 MG/ML  SOLN FLUOROSCOPY TIME:  30 seconds. PROCEDURE: The procedure, risks, benefits, and alternatives were explained to the patient. Questions regarding the procedure were encouraged and answered. The patient understands and consents to the procedure. A time-out was performed prior to the procedure. The right neck was prepped with chlorhexidine in a sterile fashion, and a sterile drape was applied covering the  operative field. A sterile gown and sterile gloves were used for the procedure. Local anesthesia was provided with 1% Lidocaine. Ultrasound was used to confirm patency of the right internal jugular vein. Under direct ultrasound guidance, a 21 gauge needle was advanced into the right internal jugular vein with ultrasound image documentation performed. After securing access with a micropuncture dilator, a guidewire was advanced into the inferior vena cava. A deployment sheath was advanced over the guidewire. This was utilized to perform IVC venography. The deployment sheath was further positioned in an appropriate location for filter deployment. A Bard Denali IVC filter was then advanced in the sheath. This was then fully deployed in the infrarenal IVC. Final filter position was confirmed with a fluoroscopic spot image. Contrast injection was also performed through the sheath under fluoroscopy to confirm patency of the IVC at the level of the filter. After the procedure the sheath was removed and hemostasis obtained with manual compression. COMPLICATIONS: None. FINDINGS: IVC venography demonstrates a normal caliber IVC with no evidence of thrombus. Renal veins are identified bilaterally. The IVC is tortuous. The IVC filter was successfully positioned below the level of the renal veins and is appropriately oriented. This IVC filter has both permanent and retrievable indications. IMPRESSION: Placement of percutaneous IVC filter in infrarenal IVC. IVC venogram shows no evidence of IVC thrombus and normal caliber of the inferior vena cava. This filter does have both permanent and retrievable indications. Electronically Signed   By: Aletta Edouard M.D.   On: 06/27/2015 17:41   Ct Femur Right Wo Contrast  06/25/2015  CLINICAL DATA:  Leg pain. Pacemaker precludes MRI. Clinical concern for compartment syndrome. EXAM: CT OF THE RIGHT FEMUR WITHOUT CONTRAST; CT OF THE RIGHT TIBIA FIBULA WITHOUT CONTRAST TECHNIQUE:  Multidetector CT imaging was performed according to the standard protocol. Multiplanar CT image reconstructions were also generated. COMPARISON:  None. FINDINGS: CT right femur: Right total hip prosthesis observed with a periprosthetic fracture tracking from the greater trochanter longitudinal along the stem. There is an oblique component which tracks medially and is displaced 1.4 cm posteromedial from the rest of the shaft. The tip of this oblique proximal medial fragment is about at the level of the tip of the implant. However, the fracture appears to extend slightly further in a longitudinal fashion posteromedially as shown on images 87 through 112 of series 1. Below this level the fracture plane is no longer well shown by CT. Bony demineralization is present. Common is requested for compartment syndrome. Assessment of the upper thigh is especially problematic due to the extensive streak artifact from the implant which can obscure the subtle intramuscular effacement of adipose tissue and other signs of compartment syndrome. I see no specific evidence of compartment syndrome but compartmental pressures would be a much more reliable check. Further distally and below the level of the implant, the visualized muscle compartments appear unusually hypertrophic/ well-developed for age, but still with marbling of adipose tissue  in the muscles which would argue against a compartment syndrome. There is a small knee effusion along with some prepatellar edema. Diffuse atherosclerotic calcification is observed. CT right lower leg: No visualized tubular fibular fracture is observed; the distal most tip of the fibula is excluded. Diffuse atherosclerosis. There is subcutaneous edema tracking in the calf diffusely. Fatty marbling in the muscle tissues is present, slightly less notable in the tibialis anterior, but the other anterior compartmental muscles appear unremarkable and accordingly I am skeptical of compartment syndrome in  the calf. IMPRESSION: 1. Periprosthetic fracture along the right hip implant, extending from the greater trochanter along the stem of the implant. This has a displaced component, with the medial fracture fragment about 1.4 cm posteromedial to the rest of the shaft at its distal tip. This distal tip is at about the level of the stem of the implant. There is also a longitudinal fracture component extending distally from the level of the tip of the implant within the shaft, which becomes indistinct several sent centimeters distally. 2. There is subcutaneous edema in the thigh and calf, but reasonably normal marbling of adipose tissue in the muscles arguing against compartment syndrome. Clearly muscle compartment pressures are more sensitive and specific measure, and particularly in the upper thigh in the vicinity of the implant the streak artifact from the metal further reduces diagnostic sensitivity. If there is continued clinical concern for compartment syndrome than compartment manometry would be recommended. 3. Small knee effusion. 4. No gas tracking in the soft tissues, or specific findings of high concern for abscess. Electronically Signed   By: Van Clines M.D.   On: 06/25/2015 14:29   Ct Tibia Fibula Right Wo Contrast  06/25/2015  CLINICAL DATA:  Leg pain. Pacemaker precludes MRI. Clinical concern for compartment syndrome. EXAM: CT OF THE RIGHT FEMUR WITHOUT CONTRAST; CT OF THE RIGHT TIBIA FIBULA WITHOUT CONTRAST TECHNIQUE: Multidetector CT imaging was performed according to the standard protocol. Multiplanar CT image reconstructions were also generated. COMPARISON:  None. FINDINGS: CT right femur: Right total hip prosthesis observed with a periprosthetic fracture tracking from the greater trochanter longitudinal along the stem. There is an oblique component which tracks medially and is displaced 1.4 cm posteromedial from the rest of the shaft. The tip of this oblique proximal medial fragment is about  at the level of the tip of the implant. However, the fracture appears to extend slightly further in a longitudinal fashion posteromedially as shown on images 87 through 112 of series 1. Below this level the fracture plane is no longer well shown by CT. Bony demineralization is present. Common is requested for compartment syndrome. Assessment of the upper thigh is especially problematic due to the extensive streak artifact from the implant which can obscure the subtle intramuscular effacement of adipose tissue and other signs of compartment syndrome. I see no specific evidence of compartment syndrome but compartmental pressures would be a much more reliable check. Further distally and below the level of the implant, the visualized muscle compartments appear unusually hypertrophic/ well-developed for age, but still with marbling of adipose tissue in the muscles which would argue against a compartment syndrome. There is a small knee effusion along with some prepatellar edema. Diffuse atherosclerotic calcification is observed. CT right lower leg: No visualized tubular fibular fracture is observed; the distal most tip of the fibula is excluded. Diffuse atherosclerosis. There is subcutaneous edema tracking in the calf diffusely. Fatty marbling in the muscle tissues is present, slightly less notable in the tibialis  anterior, but the other anterior compartmental muscles appear unremarkable and accordingly I am skeptical of compartment syndrome in the calf. IMPRESSION: 1. Periprosthetic fracture along the right hip implant, extending from the greater trochanter along the stem of the implant. This has a displaced component, with the medial fracture fragment about 1.4 cm posteromedial to the rest of the shaft at its distal tip. This distal tip is at about the level of the stem of the implant. There is also a longitudinal fracture component extending distally from the level of the tip of the implant within the shaft, which  becomes indistinct several sent centimeters distally. 2. There is subcutaneous edema in the thigh and calf, but reasonably normal marbling of adipose tissue in the muscles arguing against compartment syndrome. Clearly muscle compartment pressures are more sensitive and specific measure, and particularly in the upper thigh in the vicinity of the implant the streak artifact from the metal further reduces diagnostic sensitivity. If there is continued clinical concern for compartment syndrome than compartment manometry would be recommended. 3. Small knee effusion. 4. No gas tracking in the soft tissues, or specific findings of high concern for abscess. Electronically Signed   By: Van Clines M.D.   On: 06/25/2015 14:29   US Venous Img Lower Unilateral Right  07/20/2015  CLINICAL DATA:  82 year old male with a history of prior right lower extremity DVT. He continues on anti coagulation, however, he required a perioperative IVC filter placement for orthopedic surgery. Filter was placed 06/27/2015. He now continues on anti coagulation, and there is indication for IVC filter retrieval. EXAM: RIGHT LOWER EXTREMITY VENOUS DOPPLER ULTRASOUND TECHNIQUE: Gray-scale sonography with graded compression, as well as color Doppler and duplex ultrasound were performed to evaluate the lower extremity deep venous systems from the level of the common femoral vein and including the common femoral, femoral, profunda femoral, popliteal and calf veins including the posterior tibial, peroneal and gastrocnemius veins when visible. The superficial great saphenous vein was also interrogated. Spectral Doppler was utilized to evaluate flow at rest and with distal augmentation maneuvers in the common femoral, femoral and popliteal veins. COMPARISON:  None. FINDINGS: Contralateral Common Femoral Vein: Respiratory phasicity is normal and symmetric with the symptomatic side. No evidence of thrombus. Normal compressibility. Common Femoral  Vein: No evidence of thrombus. Normal compressibility, respiratory phasicity and response to augmentation. Saphenofemoral Junction: No evidence of thrombus. Normal compressibility and flow on color Doppler imaging. Profunda Femoral Vein: No evidence of thrombus. Normal compressibility and flow on color Doppler imaging. Femoral Vein: No evidence of thrombus. Normal compressibility, respiratory phasicity and response to augmentation. Popliteal Vein: No evidence of thrombus. Normal compressibility, respiratory phasicity and response to augmentation. Calf Veins: No evidence of thrombus. Normal compressibility and flow on color Doppler imaging. Superficial Great Saphenous Vein: Great saphenous vein remains patent with associated varicosities. Other Findings:  None. IMPRESSION: Sonographic survey of the right lower extremity negative for DVT. Dilated and tortuous great saphenous vein with associated varicosities, potentially indicating venous reflux. Signed, Dulcy Fanny. Earleen Newport, DO [facility] Electronically Signed   By: Corrie Mckusick D.O.   On: 07/20/2015 14:46   Dg Chest Port 1 View  06/26/2015  CLINICAL DATA:  Sepsis and edema. EXAM: PORTABLE CHEST - 1 VIEW COMPARISON:  One-view chest 06/25/2015. FINDINGS: The heart is enlarged. Pulmonary arteries are enlarged. Atherosclerotic changes are again noted at the aorta. Minimal bibasilar airspace disease likely reflects atelectasis. There is no edema or effusion to suggest failure. Emphysematous  changes are suggested. The visualized soft tissues and bony thorax are unremarkable. IMPRESSION: 1. Cardiomegaly without failure. 2. Atherosclerosis. 3. Minimal bibasilar airspace disease likely reflects atelectasis. Electronically Signed   By: San Morelle M.D.   On: 06/26/2015 07:34   Dg Chest Portable 1 View  06/25/2015  CLINICAL DATA:  Found unresponsive, shortness of breath EXAM: PORTABLE CHEST 1 VIEW  COMPARISON:  06/22/2007 FINDINGS: Cardiac shadow is stable. The lungs are well aerated bilaterally. No pneumothorax or focal infiltrate is seen. No acute fracture is noted. IMPRESSION: No acute abnormality seen. Electronically Signed   By: Inez Catalina M.D.   On: 06/25/2015 08:40   Dg C-arm 1-60 Min  06/29/2015  CLINICAL DATA:  ORIF for repair of a periprosthetic fracture. 30 seconds of fluoro utilized. EXAM: DG C-ARM 61-120 MIN; DG HIP (WITH OR WITHOUT PELVIS) 2-3V RIGHT COMPARISON:  06/25/2015 FINDINGS: Four images show placement of a lateral fixation plate and multiple circumferential wires which reduce the periprosthetic proximal femur fracture components into anatomic alignment. The femoral and acetabular components of the right hip prosthesis appear well seated and aligned. IMPRESSION: Well aligned right proximal femur periprosthetic fracture components following ORIF. Electronically Signed   By: Lajean Manes M.D.   On: 06/29/2015 17:26   Dg Hip Port Unilat With Pelvis 1v Right  06/29/2015  CLINICAL DATA:  Status post open reduction internal fixation of a periprosthetic right femur fracture. Initial encounter. EXAM: DG HIP (WITH OR WITHOUT PELVIS) 1V PORT RIGHT COMPARISON:  Plain films of the right hip 06/25/2015. FINDINGS: New lateral plate with 2 distal screws and cables proximally are seen about a periprosthetic right femur fracture. Right hip prosthesis is in place. The device is located. Position and alignment of the patient's fracture are near anatomic. No new abnormality is identified. IMPRESSION: ORIF periprosthetic right femur fracture without evidence of complication. No acute abnormality. Electronically Signed   By: Inge Rise M.D.   On: 06/29/2015 19:11   Dg Hip Unilat With Pelvis 2-3 Views Right  06/29/2015  CLINICAL DATA:  ORIF for repair of a periprosthetic fracture. 30 seconds of fluoro utilized. EXAM: DG C-ARM 61-120 MIN; DG HIP (WITH OR WITHOUT PELVIS) 2-3V RIGHT COMPARISON:   06/25/2015 FINDINGS: Four images show placement of a lateral fixation plate and multiple circumferential wires which reduce the periprosthetic proximal femur fracture components into anatomic alignment. The femoral and acetabular components of the right hip prosthesis appear well seated and aligned. IMPRESSION: Well aligned right proximal femur periprosthetic fracture components following ORIF. Electronically Signed   By: Lajean Manes M.D.   On: 06/29/2015 17:26   Dg Hip Unilat With Pelvis 2-3 Views Right  06/25/2015  CLINICAL DATA:  Right hip pain after fall. Periprosthetic fracture on CT. EXAM: DG HIP (WITH OR WITHOUT PELVIS) 2-3V RIGHT COMPARISON:  CT femur earlier this day. FINDINGS: Right hip arthroplasty, with known periprosthetic fracture about the greater trochanter and femoral stem. This is better characterized on recently performed CT. The displaced distal component is visualized. Femoral head component remains seated in the acetabular cup. Vascular calcifications are seen. IMPRESSION: Known periprosthetic fracture about the femoral stem of the right hip arthroplasty, better characterized on recent CT. Electronically Signed   By: Jeb Levering M.D.   On: 06/25/2015 21:07    Labs:  CBC:  Recent Labs  07/01/15 0432 07/02/15 0344 07/03/15 0259 07/24/15 0814  WBC 8.3 8.3 9.4 7.4  HGB 8.9* 8.8* 8.9* 12.8*  HCT 25.1* 25.7* 26.3* 39.3  PLT 106* 137*  168 156    COAGS:  Recent Labs  06/25/15 1600 07/24/15 0814  INR 1.97* 1.14  APTT 51*  --     BMP:  Recent Labs  06/28/15 0630 06/30/15 0414 07/01/15 0432 07/24/15 0814  NA 133* 135 135 140  K 4.2 3.7 3.7 4.0  CL 101 100* 99* 101  CO2 19* 24 26 28   GLUCOSE 89 133* 129* 107*  BUN 12 10 10 11   CALCIUM 8.0* 7.8* 8.3* 8.9  CREATININE 0.98 0.81 0.77 0.79  GFRNONAA >60 >60 >60 >60  GFRAA >60 >60 >60 >60    LIVER FUNCTION TESTS:  Recent Labs  06/27/15 0432 06/27/15 0802 06/28/15 0630 06/30/15 0414  BILITOT 1.2  1.1 1.7* 1.2  AST 51* 49* 45* 36  ALT 25 25 26 21   ALKPHOS 35* 36* 47 39  PROT 4.5* 4.3* 4.8* 4.3*  ALBUMIN 2.5* 2.5* 2.7* 2.2*    TUMOR MARKERS: No results for input(s): AFPTM, CEA, CA199, CHROMGRNA in the last 8760 hours.  Assessment and Plan:  Inferior vena cava filter placed secondary RLE DVT with impending orthopedic surgery for Rt hip fracture IVC filter placed 06/27/15 Surgery 06/29/15 Now on Eliquis daily RLE venous doppler 2/23 neg for thrombus Scheduled now for IVC filter removal Pt is aware of procedure benefits and risks including but not limited to: Infection; bleeding; vessel damage Agreeable to proceed Consent signed andin chart   Thank you for this interesting consult.  I greatly enjoyed meeting RISHAAN RODKEY and look forward to participating in their care.  A copy of this report was sent to the requesting provider on this date.  Electronically Signed: Monia Sabal A 07/24/2015, 8:59 AM   I spent a total of  30 Minutes   in face to face in clinical consultation, greater than 50% of which was counseling/coordinating care for retrieval of IVC filter

## 2015-07-30 DIAGNOSIS — J439 Emphysema, unspecified: Secondary | ICD-10-CM | POA: Diagnosis not present

## 2015-07-30 DIAGNOSIS — M1990 Unspecified osteoarthritis, unspecified site: Secondary | ICD-10-CM | POA: Diagnosis not present

## 2015-07-30 DIAGNOSIS — M6281 Muscle weakness (generalized): Secondary | ICD-10-CM | POA: Diagnosis not present

## 2015-07-30 DIAGNOSIS — R2681 Unsteadiness on feet: Secondary | ICD-10-CM | POA: Diagnosis not present

## 2015-07-30 DIAGNOSIS — R278 Other lack of coordination: Secondary | ICD-10-CM | POA: Diagnosis not present

## 2015-07-30 DIAGNOSIS — M96661 Fracture of femur following insertion of orthopedic implant, joint prosthesis, or bone plate, right leg: Secondary | ICD-10-CM | POA: Diagnosis not present

## 2015-07-30 DIAGNOSIS — Z4789 Encounter for other orthopedic aftercare: Secondary | ICD-10-CM | POA: Diagnosis not present

## 2015-07-30 DIAGNOSIS — Z5181 Encounter for therapeutic drug level monitoring: Secondary | ICD-10-CM | POA: Diagnosis not present

## 2015-07-30 DIAGNOSIS — R1311 Dysphagia, oral phase: Secondary | ICD-10-CM | POA: Diagnosis not present

## 2015-07-30 DIAGNOSIS — I519 Heart disease, unspecified: Secondary | ICD-10-CM | POA: Diagnosis not present

## 2015-08-02 DIAGNOSIS — E43 Unspecified severe protein-calorie malnutrition: Secondary | ICD-10-CM | POA: Diagnosis not present

## 2015-08-02 DIAGNOSIS — J439 Emphysema, unspecified: Secondary | ICD-10-CM | POA: Diagnosis not present

## 2015-08-02 DIAGNOSIS — I4891 Unspecified atrial fibrillation: Secondary | ICD-10-CM | POA: Diagnosis not present

## 2015-08-02 DIAGNOSIS — E559 Vitamin D deficiency, unspecified: Secondary | ICD-10-CM | POA: Diagnosis not present

## 2015-08-02 DIAGNOSIS — A419 Sepsis, unspecified organism: Secondary | ICD-10-CM | POA: Diagnosis not present

## 2015-08-02 DIAGNOSIS — N401 Enlarged prostate with lower urinary tract symptoms: Secondary | ICD-10-CM | POA: Diagnosis not present

## 2015-08-02 DIAGNOSIS — Z4789 Encounter for other orthopedic aftercare: Secondary | ICD-10-CM | POA: Diagnosis not present

## 2015-08-02 DIAGNOSIS — Z96641 Presence of right artificial hip joint: Secondary | ICD-10-CM | POA: Diagnosis not present

## 2015-08-02 DIAGNOSIS — D649 Anemia, unspecified: Secondary | ICD-10-CM | POA: Diagnosis not present

## 2015-08-04 DIAGNOSIS — I4510 Unspecified right bundle-branch block: Secondary | ICD-10-CM | POA: Diagnosis not present

## 2015-08-04 DIAGNOSIS — D649 Anemia, unspecified: Secondary | ICD-10-CM | POA: Diagnosis not present

## 2015-08-04 DIAGNOSIS — I82409 Acute embolism and thrombosis of unspecified deep veins of unspecified lower extremity: Secondary | ICD-10-CM | POA: Diagnosis not present

## 2015-08-14 DIAGNOSIS — S72391D Other fracture of shaft of right femur, subsequent encounter for closed fracture with routine healing: Secondary | ICD-10-CM | POA: Diagnosis not present

## 2015-08-16 DIAGNOSIS — M9701XA Periprosthetic fracture around internal prosthetic right hip joint, initial encounter: Secondary | ICD-10-CM | POA: Diagnosis not present

## 2015-08-16 DIAGNOSIS — Z5181 Encounter for therapeutic drug level monitoring: Secondary | ICD-10-CM | POA: Diagnosis not present

## 2015-08-16 DIAGNOSIS — Z96641 Presence of right artificial hip joint: Secondary | ICD-10-CM | POA: Diagnosis not present

## 2015-08-16 DIAGNOSIS — I4510 Unspecified right bundle-branch block: Secondary | ICD-10-CM | POA: Diagnosis not present

## 2015-08-16 DIAGNOSIS — M6281 Muscle weakness (generalized): Secondary | ICD-10-CM | POA: Diagnosis not present

## 2015-08-16 DIAGNOSIS — R609 Edema, unspecified: Secondary | ICD-10-CM | POA: Diagnosis not present

## 2015-08-16 DIAGNOSIS — Z96661 Presence of right artificial ankle joint: Secondary | ICD-10-CM | POA: Diagnosis not present

## 2015-08-16 DIAGNOSIS — D649 Anemia, unspecified: Secondary | ICD-10-CM | POA: Diagnosis not present

## 2015-08-16 DIAGNOSIS — D696 Thrombocytopenia, unspecified: Secondary | ICD-10-CM | POA: Diagnosis not present

## 2015-08-16 DIAGNOSIS — Z4789 Encounter for other orthopedic aftercare: Secondary | ICD-10-CM | POA: Diagnosis not present

## 2015-08-21 DIAGNOSIS — J439 Emphysema, unspecified: Secondary | ICD-10-CM | POA: Diagnosis not present

## 2015-08-21 DIAGNOSIS — J438 Other emphysema: Secondary | ICD-10-CM | POA: Diagnosis not present

## 2015-08-21 DIAGNOSIS — Z9181 History of falling: Secondary | ICD-10-CM | POA: Diagnosis not present

## 2015-08-21 DIAGNOSIS — I4891 Unspecified atrial fibrillation: Secondary | ICD-10-CM | POA: Diagnosis not present

## 2015-08-21 DIAGNOSIS — Z96641 Presence of right artificial hip joint: Secondary | ICD-10-CM | POA: Diagnosis not present

## 2015-08-21 DIAGNOSIS — N400 Enlarged prostate without lower urinary tract symptoms: Secondary | ICD-10-CM | POA: Diagnosis not present

## 2015-08-21 DIAGNOSIS — S7291XD Unspecified fracture of right femur, subsequent encounter for closed fracture with routine healing: Secondary | ICD-10-CM | POA: Diagnosis not present

## 2015-08-29 DIAGNOSIS — Z6821 Body mass index (BMI) 21.0-21.9, adult: Secondary | ICD-10-CM | POA: Diagnosis not present

## 2015-08-29 DIAGNOSIS — M90859 Osteopathy in diseases classified elsewhere, unspecified thigh: Secondary | ICD-10-CM | POA: Diagnosis not present

## 2015-08-29 DIAGNOSIS — F329 Major depressive disorder, single episode, unspecified: Secondary | ICD-10-CM | POA: Diagnosis not present

## 2015-08-29 DIAGNOSIS — K5909 Other constipation: Secondary | ICD-10-CM | POA: Diagnosis not present

## 2015-08-29 DIAGNOSIS — T148 Other injury of unspecified body region: Secondary | ICD-10-CM | POA: Diagnosis not present

## 2015-08-29 DIAGNOSIS — I8290 Acute embolism and thrombosis of unspecified vein: Secondary | ICD-10-CM | POA: Diagnosis not present

## 2015-08-29 DIAGNOSIS — I480 Paroxysmal atrial fibrillation: Secondary | ICD-10-CM | POA: Diagnosis not present

## 2015-09-05 DIAGNOSIS — I8290 Acute embolism and thrombosis of unspecified vein: Secondary | ICD-10-CM | POA: Diagnosis not present

## 2015-09-16 DIAGNOSIS — R609 Edema, unspecified: Secondary | ICD-10-CM | POA: Diagnosis not present

## 2015-09-16 DIAGNOSIS — M6281 Muscle weakness (generalized): Secondary | ICD-10-CM | POA: Diagnosis not present

## 2015-09-16 DIAGNOSIS — Z96661 Presence of right artificial ankle joint: Secondary | ICD-10-CM | POA: Diagnosis not present

## 2015-09-16 DIAGNOSIS — Z4789 Encounter for other orthopedic aftercare: Secondary | ICD-10-CM | POA: Diagnosis not present

## 2015-09-16 DIAGNOSIS — Z5181 Encounter for therapeutic drug level monitoring: Secondary | ICD-10-CM | POA: Diagnosis not present

## 2015-09-16 DIAGNOSIS — Z96641 Presence of right artificial hip joint: Secondary | ICD-10-CM | POA: Diagnosis not present

## 2015-09-25 DIAGNOSIS — S72391D Other fracture of shaft of right femur, subsequent encounter for closed fracture with routine healing: Secondary | ICD-10-CM | POA: Diagnosis not present

## 2015-10-16 DIAGNOSIS — Z96661 Presence of right artificial ankle joint: Secondary | ICD-10-CM | POA: Diagnosis not present

## 2015-10-16 DIAGNOSIS — Z4789 Encounter for other orthopedic aftercare: Secondary | ICD-10-CM | POA: Diagnosis not present

## 2015-10-16 DIAGNOSIS — Z5181 Encounter for therapeutic drug level monitoring: Secondary | ICD-10-CM | POA: Diagnosis not present

## 2015-10-16 DIAGNOSIS — R609 Edema, unspecified: Secondary | ICD-10-CM | POA: Diagnosis not present

## 2015-10-16 DIAGNOSIS — M6281 Muscle weakness (generalized): Secondary | ICD-10-CM | POA: Diagnosis not present

## 2015-10-16 DIAGNOSIS — Z96641 Presence of right artificial hip joint: Secondary | ICD-10-CM | POA: Diagnosis not present

## 2015-10-31 DIAGNOSIS — K5909 Other constipation: Secondary | ICD-10-CM | POA: Diagnosis not present

## 2015-10-31 DIAGNOSIS — F329 Major depressive disorder, single episode, unspecified: Secondary | ICD-10-CM | POA: Diagnosis not present

## 2015-10-31 DIAGNOSIS — Z6820 Body mass index (BMI) 20.0-20.9, adult: Secondary | ICD-10-CM | POA: Diagnosis not present

## 2015-10-31 DIAGNOSIS — R7301 Impaired fasting glucose: Secondary | ICD-10-CM | POA: Diagnosis not present

## 2015-10-31 DIAGNOSIS — I8290 Acute embolism and thrombosis of unspecified vein: Secondary | ICD-10-CM | POA: Diagnosis not present

## 2015-10-31 DIAGNOSIS — T148 Other injury of unspecified body region: Secondary | ICD-10-CM | POA: Diagnosis not present

## 2015-10-31 DIAGNOSIS — M810 Age-related osteoporosis without current pathological fracture: Secondary | ICD-10-CM | POA: Diagnosis not present

## 2015-11-16 DIAGNOSIS — Z96661 Presence of right artificial ankle joint: Secondary | ICD-10-CM | POA: Diagnosis not present

## 2015-11-16 DIAGNOSIS — R609 Edema, unspecified: Secondary | ICD-10-CM | POA: Diagnosis not present

## 2015-11-16 DIAGNOSIS — Z5181 Encounter for therapeutic drug level monitoring: Secondary | ICD-10-CM | POA: Diagnosis not present

## 2015-11-16 DIAGNOSIS — Z4789 Encounter for other orthopedic aftercare: Secondary | ICD-10-CM | POA: Diagnosis not present

## 2015-11-16 DIAGNOSIS — Z96641 Presence of right artificial hip joint: Secondary | ICD-10-CM | POA: Diagnosis not present

## 2015-11-16 DIAGNOSIS — M6281 Muscle weakness (generalized): Secondary | ICD-10-CM | POA: Diagnosis not present

## 2015-12-16 DIAGNOSIS — Z4789 Encounter for other orthopedic aftercare: Secondary | ICD-10-CM | POA: Diagnosis not present

## 2015-12-16 DIAGNOSIS — Z96641 Presence of right artificial hip joint: Secondary | ICD-10-CM | POA: Diagnosis not present

## 2015-12-16 DIAGNOSIS — Z96661 Presence of right artificial ankle joint: Secondary | ICD-10-CM | POA: Diagnosis not present

## 2015-12-16 DIAGNOSIS — M6281 Muscle weakness (generalized): Secondary | ICD-10-CM | POA: Diagnosis not present

## 2015-12-16 DIAGNOSIS — R609 Edema, unspecified: Secondary | ICD-10-CM | POA: Diagnosis not present

## 2015-12-16 DIAGNOSIS — Z5181 Encounter for therapeutic drug level monitoring: Secondary | ICD-10-CM | POA: Diagnosis not present

## 2015-12-25 DIAGNOSIS — R7301 Impaired fasting glucose: Secondary | ICD-10-CM | POA: Diagnosis not present

## 2015-12-25 DIAGNOSIS — Z125 Encounter for screening for malignant neoplasm of prostate: Secondary | ICD-10-CM | POA: Diagnosis not present

## 2015-12-25 DIAGNOSIS — M810 Age-related osteoporosis without current pathological fracture: Secondary | ICD-10-CM | POA: Diagnosis not present

## 2015-12-25 DIAGNOSIS — I480 Paroxysmal atrial fibrillation: Secondary | ICD-10-CM | POA: Diagnosis not present

## 2015-12-25 DIAGNOSIS — E538 Deficiency of other specified B group vitamins: Secondary | ICD-10-CM | POA: Diagnosis not present

## 2015-12-25 DIAGNOSIS — D6489 Other specified anemias: Secondary | ICD-10-CM | POA: Diagnosis not present

## 2015-12-26 DIAGNOSIS — S72391D Other fracture of shaft of right femur, subsequent encounter for closed fracture with routine healing: Secondary | ICD-10-CM | POA: Diagnosis not present

## 2015-12-29 DIAGNOSIS — Z0000 Encounter for general adult medical examination without abnormal findings: Secondary | ICD-10-CM | POA: Diagnosis not present

## 2015-12-29 DIAGNOSIS — I8290 Acute embolism and thrombosis of unspecified vein: Secondary | ICD-10-CM | POA: Diagnosis not present

## 2015-12-29 DIAGNOSIS — D6489 Other specified anemias: Secondary | ICD-10-CM | POA: Diagnosis not present

## 2015-12-29 DIAGNOSIS — E298 Other testicular dysfunction: Secondary | ICD-10-CM | POA: Diagnosis not present

## 2015-12-29 DIAGNOSIS — K5909 Other constipation: Secondary | ICD-10-CM | POA: Diagnosis not present

## 2015-12-29 DIAGNOSIS — H6122 Impacted cerumen, left ear: Secondary | ICD-10-CM | POA: Diagnosis not present

## 2015-12-29 DIAGNOSIS — M810 Age-related osteoporosis without current pathological fracture: Secondary | ICD-10-CM | POA: Diagnosis not present

## 2015-12-29 DIAGNOSIS — F329 Major depressive disorder, single episode, unspecified: Secondary | ICD-10-CM | POA: Diagnosis not present

## 2015-12-29 DIAGNOSIS — I872 Venous insufficiency (chronic) (peripheral): Secondary | ICD-10-CM | POA: Diagnosis not present

## 2015-12-29 DIAGNOSIS — I480 Paroxysmal atrial fibrillation: Secondary | ICD-10-CM | POA: Diagnosis not present

## 2016-01-16 DIAGNOSIS — Z4789 Encounter for other orthopedic aftercare: Secondary | ICD-10-CM | POA: Diagnosis not present

## 2016-01-16 DIAGNOSIS — Z96641 Presence of right artificial hip joint: Secondary | ICD-10-CM | POA: Diagnosis not present

## 2016-01-16 DIAGNOSIS — Z96661 Presence of right artificial ankle joint: Secondary | ICD-10-CM | POA: Diagnosis not present

## 2016-01-16 DIAGNOSIS — M6281 Muscle weakness (generalized): Secondary | ICD-10-CM | POA: Diagnosis not present

## 2016-01-16 DIAGNOSIS — R609 Edema, unspecified: Secondary | ICD-10-CM | POA: Diagnosis not present

## 2016-01-16 DIAGNOSIS — Z5181 Encounter for therapeutic drug level monitoring: Secondary | ICD-10-CM | POA: Diagnosis not present

## 2016-01-23 DIAGNOSIS — M810 Age-related osteoporosis without current pathological fracture: Secondary | ICD-10-CM | POA: Diagnosis not present

## 2016-01-23 DIAGNOSIS — Z79899 Other long term (current) drug therapy: Secondary | ICD-10-CM | POA: Diagnosis not present

## 2016-01-23 DIAGNOSIS — Z6820 Body mass index (BMI) 20.0-20.9, adult: Secondary | ICD-10-CM | POA: Diagnosis not present

## 2016-02-08 ENCOUNTER — Ambulatory Visit (HOSPITAL_COMMUNITY): Admission: RE | Admit: 2016-02-08 | Payer: Medicare Other | Source: Ambulatory Visit

## 2016-02-16 ENCOUNTER — Ambulatory Visit (HOSPITAL_COMMUNITY): Payer: Medicare Other

## 2016-02-16 DIAGNOSIS — R609 Edema, unspecified: Secondary | ICD-10-CM | POA: Diagnosis not present

## 2016-02-16 DIAGNOSIS — Z5181 Encounter for therapeutic drug level monitoring: Secondary | ICD-10-CM | POA: Diagnosis not present

## 2016-02-16 DIAGNOSIS — Z96661 Presence of right artificial ankle joint: Secondary | ICD-10-CM | POA: Diagnosis not present

## 2016-02-16 DIAGNOSIS — M6281 Muscle weakness (generalized): Secondary | ICD-10-CM | POA: Diagnosis not present

## 2016-02-16 DIAGNOSIS — Z4789 Encounter for other orthopedic aftercare: Secondary | ICD-10-CM | POA: Diagnosis not present

## 2016-02-16 DIAGNOSIS — Z96641 Presence of right artificial hip joint: Secondary | ICD-10-CM | POA: Diagnosis not present

## 2016-02-22 ENCOUNTER — Ambulatory Visit (HOSPITAL_COMMUNITY)
Admission: RE | Admit: 2016-02-22 | Discharge: 2016-02-22 | Disposition: A | Discharge status: Home / Self Care | Payer: Medicare Other | Source: Ambulatory Visit | Attending: Internal Medicine | Admitting: Internal Medicine

## 2016-02-22 ENCOUNTER — Encounter (HOSPITAL_COMMUNITY): Payer: Self-pay

## 2016-02-22 DIAGNOSIS — M810 Age-related osteoporosis without current pathological fracture: Secondary | ICD-10-CM | POA: Insufficient documentation

## 2016-02-22 MED ORDER — ZOLEDRONIC ACID 5 MG/100ML IV SOLN
5.0000 mg | Freq: Once | INTRAVENOUS | Status: AC
  Administered 2016-02-22: 5 mg via INTRAVENOUS
  Filled 2016-02-22: qty 100

## 2016-02-22 MED ORDER — SODIUM CHLORIDE 0.9 % IV SOLN
INTRAVENOUS | Status: DC
  Administered 2016-02-22: 11:00:00 via INTRAVENOUS

## 2016-02-22 NOTE — Discharge Instructions (Signed)

## 2016-03-05 ENCOUNTER — Ambulatory Visit (INDEPENDENT_AMBULATORY_CARE_PROVIDER_SITE_OTHER): Payer: Medicare Other | Admitting: Orthopaedic Surgery

## 2016-03-05 DIAGNOSIS — M1711 Unilateral primary osteoarthritis, right knee: Secondary | ICD-10-CM

## 2016-03-17 DIAGNOSIS — Z4789 Encounter for other orthopedic aftercare: Secondary | ICD-10-CM | POA: Diagnosis not present

## 2016-03-17 DIAGNOSIS — Z96661 Presence of right artificial ankle joint: Secondary | ICD-10-CM | POA: Diagnosis not present

## 2016-03-17 DIAGNOSIS — M6281 Muscle weakness (generalized): Secondary | ICD-10-CM | POA: Diagnosis not present

## 2016-03-17 DIAGNOSIS — R609 Edema, unspecified: Secondary | ICD-10-CM | POA: Diagnosis not present

## 2016-03-17 DIAGNOSIS — Z5181 Encounter for therapeutic drug level monitoring: Secondary | ICD-10-CM | POA: Diagnosis not present

## 2016-03-17 DIAGNOSIS — Z96641 Presence of right artificial hip joint: Secondary | ICD-10-CM | POA: Diagnosis not present

## 2016-04-17 DIAGNOSIS — Z96661 Presence of right artificial ankle joint: Secondary | ICD-10-CM | POA: Diagnosis not present

## 2016-04-17 DIAGNOSIS — Z96641 Presence of right artificial hip joint: Secondary | ICD-10-CM | POA: Diagnosis not present

## 2016-04-17 DIAGNOSIS — Z5181 Encounter for therapeutic drug level monitoring: Secondary | ICD-10-CM | POA: Diagnosis not present

## 2016-04-17 DIAGNOSIS — R609 Edema, unspecified: Secondary | ICD-10-CM | POA: Diagnosis not present

## 2016-04-17 DIAGNOSIS — M6281 Muscle weakness (generalized): Secondary | ICD-10-CM | POA: Diagnosis not present

## 2016-04-17 DIAGNOSIS — Z4789 Encounter for other orthopedic aftercare: Secondary | ICD-10-CM | POA: Diagnosis not present

## 2016-05-05 ENCOUNTER — Emergency Department (HOSPITAL_COMMUNITY): Payer: Medicare Other

## 2016-05-05 ENCOUNTER — Encounter (HOSPITAL_COMMUNITY): Payer: Self-pay

## 2016-05-05 ENCOUNTER — Inpatient Hospital Stay (HOSPITAL_COMMUNITY)
Admission: EM | Admit: 2016-05-05 | Discharge: 2016-05-09 | DRG: 604 | Disposition: A | Discharge status: Home Health | Payer: Medicare Other | Attending: Internal Medicine | Admitting: Internal Medicine

## 2016-05-05 DIAGNOSIS — G9340 Encephalopathy, unspecified: Secondary | ICD-10-CM | POA: Diagnosis not present

## 2016-05-05 DIAGNOSIS — D649 Anemia, unspecified: Secondary | ICD-10-CM | POA: Diagnosis present

## 2016-05-05 DIAGNOSIS — E43 Unspecified severe protein-calorie malnutrition: Secondary | ICD-10-CM | POA: Diagnosis not present

## 2016-05-05 DIAGNOSIS — S79912A Unspecified injury of left hip, initial encounter: Secondary | ICD-10-CM | POA: Diagnosis not present

## 2016-05-05 DIAGNOSIS — W19XXXA Unspecified fall, initial encounter: Secondary | ICD-10-CM

## 2016-05-05 DIAGNOSIS — W010XXA Fall on same level from slipping, tripping and stumbling without subsequent striking against object, initial encounter: Secondary | ICD-10-CM | POA: Diagnosis present

## 2016-05-05 DIAGNOSIS — S8012XA Contusion of left lower leg, initial encounter: Principal | ICD-10-CM | POA: Diagnosis present

## 2016-05-05 DIAGNOSIS — M25552 Pain in left hip: Secondary | ICD-10-CM | POA: Diagnosis not present

## 2016-05-05 DIAGNOSIS — R509 Fever, unspecified: Secondary | ICD-10-CM | POA: Diagnosis not present

## 2016-05-05 DIAGNOSIS — I481 Persistent atrial fibrillation: Secondary | ICD-10-CM | POA: Diagnosis not present

## 2016-05-05 DIAGNOSIS — N390 Urinary tract infection, site not specified: Secondary | ICD-10-CM | POA: Diagnosis not present

## 2016-05-05 DIAGNOSIS — I4819 Other persistent atrial fibrillation: Secondary | ICD-10-CM | POA: Diagnosis present

## 2016-05-05 DIAGNOSIS — R51 Headache: Secondary | ICD-10-CM | POA: Diagnosis not present

## 2016-05-05 DIAGNOSIS — R739 Hyperglycemia, unspecified: Secondary | ICD-10-CM | POA: Diagnosis present

## 2016-05-05 DIAGNOSIS — N179 Acute kidney failure, unspecified: Secondary | ICD-10-CM | POA: Diagnosis not present

## 2016-05-05 DIAGNOSIS — S0990XA Unspecified injury of head, initial encounter: Secondary | ICD-10-CM | POA: Diagnosis not present

## 2016-05-05 DIAGNOSIS — Y92009 Unspecified place in unspecified non-institutional (private) residence as the place of occurrence of the external cause: Secondary | ICD-10-CM

## 2016-05-05 DIAGNOSIS — I482 Chronic atrial fibrillation, unspecified: Secondary | ICD-10-CM | POA: Diagnosis present

## 2016-05-05 DIAGNOSIS — N400 Enlarged prostate without lower urinary tract symptoms: Secondary | ICD-10-CM | POA: Diagnosis present

## 2016-05-05 DIAGNOSIS — M810 Age-related osteoporosis without current pathological fracture: Secondary | ICD-10-CM | POA: Diagnosis present

## 2016-05-05 DIAGNOSIS — Z66 Do not resuscitate: Secondary | ICD-10-CM | POA: Diagnosis present

## 2016-05-05 DIAGNOSIS — Z96643 Presence of artificial hip joint, bilateral: Secondary | ICD-10-CM | POA: Diagnosis present

## 2016-05-05 DIAGNOSIS — Z681 Body mass index (BMI) 19 or less, adult: Secondary | ICD-10-CM

## 2016-05-05 DIAGNOSIS — S199XXA Unspecified injury of neck, initial encounter: Secondary | ICD-10-CM | POA: Diagnosis not present

## 2016-05-05 DIAGNOSIS — Z7901 Long term (current) use of anticoagulants: Secondary | ICD-10-CM

## 2016-05-05 DIAGNOSIS — S79911A Unspecified injury of right hip, initial encounter: Secondary | ICD-10-CM | POA: Diagnosis not present

## 2016-05-05 DIAGNOSIS — Z7189 Other specified counseling: Secondary | ICD-10-CM

## 2016-05-05 DIAGNOSIS — I4510 Unspecified right bundle-branch block: Secondary | ICD-10-CM | POA: Diagnosis present

## 2016-05-05 DIAGNOSIS — M25551 Pain in right hip: Secondary | ICD-10-CM | POA: Diagnosis not present

## 2016-05-05 DIAGNOSIS — Z79899 Other long term (current) drug therapy: Secondary | ICD-10-CM

## 2016-05-05 DIAGNOSIS — M542 Cervicalgia: Secondary | ICD-10-CM | POA: Diagnosis not present

## 2016-05-05 DIAGNOSIS — M1990 Unspecified osteoarthritis, unspecified site: Secondary | ICD-10-CM | POA: Diagnosis present

## 2016-05-05 DIAGNOSIS — S3993XA Unspecified injury of pelvis, initial encounter: Secondary | ICD-10-CM | POA: Diagnosis not present

## 2016-05-05 LAB — BASIC METABOLIC PANEL
Anion gap: 13 (ref 5–15)
BUN: 10 mg/dL (ref 6–20)
CALCIUM: 8.8 mg/dL — AB (ref 8.9–10.3)
CHLORIDE: 101 mmol/L (ref 101–111)
CO2: 21 mmol/L — AB (ref 22–32)
CREATININE: 1.3 mg/dL — AB (ref 0.61–1.24)
GFR calc Af Amer: 54 mL/min — ABNORMAL LOW (ref >60)
GFR calc non Af Amer: 47 mL/min — ABNORMAL LOW (ref >60)
GLUCOSE: 174 mg/dL — AB (ref 65–99)
Potassium: 4.6 mmol/L (ref 3.5–5.1)
Sodium: 135 mmol/L (ref 135–145)

## 2016-05-05 LAB — CBC WITH DIFFERENTIAL/PLATELET
BASOS PCT: 0 %
Basophils Absolute: 0 10*3/uL (ref 0.0–0.1)
Eosinophils Absolute: 0 10*3/uL (ref 0.0–0.7)
Eosinophils Relative: 0 %
HEMATOCRIT: 35.4 % — AB (ref 39.0–52.0)
HEMOGLOBIN: 12.1 g/dL — AB (ref 13.0–17.0)
LYMPHS ABS: 0.6 10*3/uL — AB (ref 0.7–4.0)
LYMPHS PCT: 4 %
MCH: 33.4 pg (ref 26.0–34.0)
MCHC: 34.2 g/dL (ref 30.0–36.0)
MCV: 97.8 fL (ref 78.0–100.0)
MONO ABS: 0.8 10*3/uL (ref 0.1–1.0)
MONOS PCT: 5 %
NEUTROS ABS: 13.7 10*3/uL — AB (ref 1.7–7.7)
Neutrophils Relative %: 91 %
Platelets: 121 10*3/uL — ABNORMAL LOW (ref 150–400)
RBC: 3.62 MIL/uL — ABNORMAL LOW (ref 4.22–5.81)
RDW: 13.5 % (ref 11.5–15.5)
WBC: 15.1 10*3/uL — ABNORMAL HIGH (ref 4.0–10.5)

## 2016-05-05 MED ORDER — ACETAMINOPHEN 500 MG PO TABS
1000.0000 mg | ORAL_TABLET | Freq: Once | ORAL | Status: AC
  Administered 2016-05-05: 1000 mg via ORAL
  Filled 2016-05-05: qty 2

## 2016-05-05 MED ORDER — IOPAMIDOL (ISOVUE-300) INJECTION 61%: 100.0000 mL | Freq: Once | INTRAVENOUS | Status: DC | PRN

## 2016-05-05 MED ORDER — SODIUM CHLORIDE 0.9 % IV BOLUS (SEPSIS)
1000.0000 mL | Freq: Once | INTRAVENOUS | Status: AC
  Administered 2016-05-05: 1000 mL via INTRAVENOUS

## 2016-05-05 MED ORDER — MORPHINE SULFATE (PF) 4 MG/ML IV SOLN
2.0000 mg | Freq: Once | INTRAVENOUS | Status: AC
  Administered 2016-05-05: 2 mg via INTRAVENOUS
  Filled 2016-05-05: qty 1

## 2016-05-05 NOTE — ED Provider Notes (Signed)
[facility] DEPT Provider Note   CSN: [id number] Arrival date & time:        History   Chief Complaint Chief Complaint  Patient presents with  . Altered Mental Status    HPI Gerald Sandoval is a 82 y.o. male.  82 yo M with a chief complaint of a fall. Patient that she was backing out of an elevator knee tripped and fell and struck the back of his head. He is complaining mostly of bilateral hip pain. Also states that he has some pain to the left lower leg. Denies pain to his head denies neck pain denies back pain. Denies fevers or chills denies cough denies dysuria. Per EMS the patient was febrile and tachycardic and tachypnic.  Patient denies having any fevers.   The history is provided by the patient.  Fall  This is a recurrent problem. The current episode started yesterday. The problem occurs every several days. The problem has been resolved. Pertinent negatives include no chest pain, no abdominal pain, no headaches and no shortness of breath. The symptoms are aggravated by bending and twisting. Nothing relieves the symptoms. He has tried nothing for the symptoms. The treatment provided no relief.    Past Medical History:  Diagnosis Date  . Abscess of left hip    infection left hip  . Anemia   . BPH (benign prostatic hyperplasia)   . Colon polyp   . Diastolic dysfunction   . Dysphagia    unspecified  . Emphysema   . Gastric ulcer   . Gastritis   . Hernia    Rt. Inguinial  . History of colonic polyps   . Hypogonadism in male   . Kidney stones   . Osteoarthritis   . Osteoporosis   . Permanent atrial fibrillation ([place])   . RBBB (right bundle branch block)   . Rotator cuff injury     Patient Active Problem List   Diagnosis Date Noted  . AKI (acute kidney injury) ([place]) 05/05/2016  . Persistent atrial fibrillation ([place])   . Chronic anticoagulation-Pradaxa prior to admission 06/28/2015  . RBBB 06/28/2015  . DVT of Rt lower limb, acute ([place]) 06/28/2015  . S/P IVC  filter 06/27/15 06/28/2015  . S/P Lt hip replacement, chronically infected [facility]) 06/28/2015  . Lactic acidosis   . Encounter for palliative care   . Goals of care, counseling/discussion   . Fracture - Rt hip prosthesis this adm   . Severe sepsis- found down at nursing home 06/25/2015  . Sepsis ([place]) 06/25/2015  . Edema of right lower extremity 06/25/2015  . History of right hip replacement 06/25/2015  . Hyperkalemia 06/25/2015  . Metabolic acidosis [id number]  . Chronic a-fib ([place]) 08/04/2009  . EMPHYSEMA 08/03/2009  . OSTEOARTHRITIS 08/03/2009  . ARTHRITIS 08/03/2009  . GASTRIC ULCER 06/02/2009  . GASTRITIS 06/02/2009  . COLONIC POLYPS 05/03/2009  . DYSPHAGIA UNSPECIFIED 05/03/2009    Past Surgical History:  Procedure Laterality Date  . INGUINAL HERNIA REPAIR     RT  . ORIF PERIPROSTHETIC FRACTURE Right 06/29/2015   Procedure: OPEN REDUCTION INTERNAL FIXATION (ORIF) RIGHT HIP PERIPROSTHETIC FRACTURE;  Surgeon: Leandrew Koyanagi, MD;  Location: [facility];  Service: Orthopedics;  Laterality: Right;  . Resection of Ribs    . TOTAL HIP ARTHROPLASTY     Both hips; Left in 2004, Right in 2007  . TRANSURETHRAL RESECTION OF PROSTATE         Home Medications    Prior to Admission medications  Medication Sig Start Date End Date Taking? Authorizing Provider  amoxicillin (AMOXIL) 500 MG tablet Take 500 mg by mouth 2 (two) times daily.      Historical Provider, MD  apixaban (ELIQUIS) 2.5 MG TABS tablet Take 1 tablet (2.5 mg total) by mouth 2 (two) times daily. 07/03/15   Thurnell Lose, MD  Cyanocobalamin (VITAMIN B-12) 5000 MCG SUBL Place 1 tablet under the tongue daily.      Historical Provider, MD  ergocalciferol (VITAMIN D2) 50000 UNITS capsule Take 50,000 Units by mouth once a week.     Historical Provider, MD  ferrous sulfate (IRON SUPPLEMENT) 325 (65 FE) MG tablet Take 325 mg by mouth daily with breakfast.      Historical Provider, MD  finasteride (PROSCAR) 5 MG tablet Take 5 mg by  mouth daily.      Historical Provider, MD  HYDROcodone-acetaminophen (NORCO) 7.5-325 MG tablet Take 1-2 tablets by mouth every 6 (six) hours as needed for moderate pain. 06/29/15   Leandrew Koyanagi, MD  metoprolol (TOPROL-XL) 50 MG 24 hr tablet Take 1.5 tablets (75 mg total) by mouth daily. 04/15/11   Thompson Grayer, MD  potassium phosphate, monobasic, (K-PHOS ORIGINAL) 500 MG tablet Take 1,250 mg by mouth daily.      Historical Provider, MD  senna-docusate (SENOKOT-S) 8.6-50 MG tablet Take 1 tablet by mouth at bedtime. 07/03/15   Thurnell Lose, MD  sulfamethoxazole-trimethoprim (BACTRIM DS) 800-160 MG per tablet Take 1 tablet by mouth 2 (two) times daily.      Historical Provider, MD  tamsulosin (FLOMAX) 0.4 MG CAPS capsule Take 0.4 mg by mouth.    Historical Provider, MD    Family History Family History  Problem Relation Age of Onset  . Colon cancer Maternal Uncle   . Diabetes Maternal Aunt     Social History Social History  Substance Use Topics  . Smoking status: Never Smoker  . Smokeless tobacco: Never Used  . Alcohol use No     Allergies   Primidone   Review of Systems Review of Systems  Constitutional: Negative for chills and fever.  HENT: Negative for congestion and facial swelling.   Eyes: Negative for discharge and visual disturbance.  Respiratory: Negative for shortness of breath.   Cardiovascular: Negative for chest pain and palpitations.  Gastrointestinal: Negative for abdominal pain, diarrhea and vomiting.  Musculoskeletal: Positive for arthralgias and myalgias.  Skin: Negative for color change and rash.  Neurological: Negative for tremors, syncope and headaches.  Psychiatric/Behavioral: Negative for confusion and dysphoric mood.     Physical Exam Updated Vital Signs BP 118/76   Pulse 108   Resp 22   SpO2 92%   Physical Exam  Constitutional: He is oriented to person, place, and time. He appears well-developed and well-nourished.  HENT:  Head: Normocephalic  and atraumatic.  Eyes: EOM are normal. Pupils are equal, round, and reactive to light.  Neck: Normal range of motion. Neck supple. No JVD present.  Cardiovascular: An irregularly irregular rhythm present. Tachycardia present.  Exam reveals no gallop and no friction rub.   No murmur heard. Pulmonary/Chest: No respiratory distress. He has no wheezes.  Abdominal: He exhibits no distension. There is no rebound and no guarding.  Musculoskeletal: Normal range of motion.       Legs: Neurological: He is alert and oriented to person, place, and time.  Skin: No rash noted. No pallor.  Psychiatric: He has a normal mood and affect. His behavior is normal.  Nursing note  and vitals reviewed.    ED Treatments / Results  Labs (all labs ordered are listed, but only abnormal results are displayed) Labs Reviewed  CBC WITH DIFFERENTIAL/PLATELET - Abnormal; Notable for the following:       Result Value   WBC 15.1 (*)    RBC 3.62 (*)    Hemoglobin 12.1 (*)    HCT 35.4 (*)    Platelets 121 (*)    Neutro Abs 13.7 (*)    Lymphs Abs 0.6 (*)    All other components within normal limits  BASIC METABOLIC PANEL - Abnormal; Notable for the following:    CO2 21 (*)    Glucose, Bld 174 (*)    Creatinine, Ser 1.30 (*)    Calcium 8.8 (*)    GFR calc non Af Amer 47 (*)    GFR calc Af Amer 54 (*)    All other components within normal limits  URINE CULTURE  URINALYSIS, ROUTINE W REFLEX MICROSCOPIC    EKG  EKG Interpretation  Date/Time:  Sunday May 05 2016 18:43:12 EST Ventricular Rate:  130 PR Interval:    QRS Duration: 137 QT Interval:  356 QTC Calculation: 524 R Axis:   16 Text Interpretation:  Atrial fibrillation Right bundle branch block No significant change since last tracing Confirmed by Khian Remo MD, DANIEL [phone]) on 05/05/2016 6:46:53 PM       Radiology Dg Tibia/fibula Left  Result Date: 05/05/2016 CLINICAL DATA:  Pain after fall with hematoma noted laterally. EXAM: LEFT TIBIA AND  FIBULA - 2 VIEW COMPARISON:  None. FINDINGS: No acute displaced fracture or bone destruction. Popliteal and tibial arteriosclerosis. The knee and ankle joints are maintained. Small ossicles are noted off the tip of the fibula consistent with old remote trauma. More focal calf soft tissue swelling noted posterolaterally the left leg consistent with reported hematoma. IMPRESSION: Soft tissue swelling of the posterolateral leg without acute osseous abnormality. Electronically Signed   By: Ashley Royalty M.D.   On: 05/05/2016 20:00   Ct Head Wo Contrast  Result Date: 05/05/2016 CLINICAL DATA:  Headache and neck pain after fall today. No loss of consciousness. No EXAM: CT HEAD WITHOUT CONTRAST CT CERVICAL SPINE WITHOUT CONTRAST TECHNIQUE: Multidetector CT imaging of the head and cervical spine was performed following the standard protocol without intravenous contrast. Multiplanar CT image reconstructions of the cervical spine were also generated. COMPARISON:  None. FINDINGS: CT HEAD FINDINGS BRAIN: There is sulcal and ventricular prominence consistent with superficial and central atrophy. No intraparenchymal hemorrhage, mass effect nor midline shift. There is a chronic stable moderate degree of patchy periventricular and subcortical white matter hypodensity consistent with chronic small vessel ischemic disease. No acute large vascular territory infarcts. No abnormal extra-axial fluid collections. Basal cisterns are patent. VASCULAR: Moderate calcific atherosclerosis of the carotid siphons. SKULL: No skull fracture. No significant scalp soft tissue swelling. The walls of the left maxillary sinus are somewhat thickened relative to right likely related to change from chronic sinusitis. There bold SINUSES/ORBITS: The mastoid air-cells are clear. The included paranasal sinuses are well-aerated.The included ocular globes and orbital contents are non-suspicious. OTHER: None. CT CERVICAL SPINE FINDINGS Alignment: Normal  alignment of the cervical spine. Skull base and vertebrae: There are anterior wedge compression fractures of T1 and T3 possibly be with T3 as is not apparent on the lateral chest radiograph from 06/22/2007 and also incompletely included on the prior cervical spine CT from 06/25/2015. No retropulsion is identified. Soft tissues and spinal canal: No spinal  stenosis nor intra canal hemorrhage. Disc levels: Chronic mild disc space narrowing at C5-6 with small posterior marginal osteophytes. Facet joints are maintained without malalignment. There is mild multilevel degenerative facet arthropathy more so on the right from C3 through C6 and on the left at C2-3. The atlantodental interval is maintained. Upper chest: Negative for acute disease Other: Atherosclerosis of the visualized aortic arch. IMPRESSION: Chronic small vessel ischemic disease of periventricular and subcortical white matter. No acute intracranial abnormality. There is mild anterior wedge compression deformity of the T3 vertebral body which appears new relative to the prior CT from 06/25/2015. It is however incompletely included on the prior exam. No retropulsion nor intraspinal hematoma is seen. Clinical correlation would be helpful over the T3 level. Non emergent MRI or bone scan may also help for further evaluation. Stable mild compression of T1. No acute cervical spinal fracture. Degenerative disc disease at C5-6. Electronically Signed   By: Ashley Royalty M.D.   On: 05/05/2016 20:20   Ct Cervical Spine Wo Contrast  Result Date: 05/05/2016 CLINICAL DATA:  Headache and neck pain after fall today. No loss of consciousness. No EXAM: CT HEAD WITHOUT CONTRAST CT CERVICAL SPINE WITHOUT CONTRAST TECHNIQUE: Multidetector CT imaging of the head and cervical spine was performed following the standard protocol without intravenous contrast. Multiplanar CT image reconstructions of the cervical spine were also generated. COMPARISON:  None. FINDINGS: CT HEAD  FINDINGS BRAIN: There is sulcal and ventricular prominence consistent with superficial and central atrophy. No intraparenchymal hemorrhage, mass effect nor midline shift. There is a chronic stable moderate degree of patchy periventricular and subcortical white matter hypodensity consistent with chronic small vessel ischemic disease. No acute large vascular territory infarcts. No abnormal extra-axial fluid collections. Basal cisterns are patent. VASCULAR: Moderate calcific atherosclerosis of the carotid siphons. SKULL: No skull fracture. No significant scalp soft tissue swelling. The walls of the left maxillary sinus are somewhat thickened relative to right likely related to change from chronic sinusitis. There bold SINUSES/ORBITS: The mastoid air-cells are clear. The included paranasal sinuses are well-aerated.The included ocular globes and orbital contents are non-suspicious. OTHER: None. CT CERVICAL SPINE FINDINGS Alignment: Normal alignment of the cervical spine. Skull base and vertebrae: There are anterior wedge compression fractures of T1 and T3 possibly be with T3 as is not apparent on the lateral chest radiograph from 06/22/2007 and also incompletely included on the prior cervical spine CT from 06/25/2015. No retropulsion is identified. Soft tissues and spinal canal: No spinal stenosis nor intra canal hemorrhage. Disc levels: Chronic mild disc space narrowing at C5-6 with small posterior marginal osteophytes. Facet joints are maintained without malalignment. There is mild multilevel degenerative facet arthropathy more so on the right from C3 through C6 and on the left at C2-3. The atlantodental interval is maintained. Upper chest: Negative for acute disease Other: Atherosclerosis of the visualized aortic arch. IMPRESSION: Chronic small vessel ischemic disease of periventricular and subcortical white matter. No acute intracranial abnormality. There is mild anterior wedge compression deformity of the T3  vertebral body which appears new relative to the prior CT from 06/25/2015. It is however incompletely included on the prior exam. No retropulsion nor intraspinal hematoma is seen. Clinical correlation would be helpful over the T3 level. Non emergent MRI or bone scan may also help for further evaluation. Stable mild compression of T1. No acute cervical spinal fracture. Degenerative disc disease at C5-6. Electronically Signed   By: Ashley Royalty M.D.   On: 05/05/2016 20:20   Ct Pelvis  Wo Contrast  Result Date: 05/05/2016 CLINICAL DATA:  Bilateral hip pain after a fall today, greater on the right. Prior arthroplasties. EXAM: CT PELVIS WITHOUT CONTRAST TECHNIQUE: Multidetector CT imaging of the pelvis was performed following the standard protocol without intravenous contrast. COMPARISON:  Hip radiographs earlier today. CT abdomen and pelvis 06/26/2015. FINDINGS: Urinary Tract: Partially visualized right lower pole renal cyst. Bladder is largely obscured by streak artifact. Bowel: Left-sided colonic diverticulosis without evidence of diverticulitis. Vascular/Lymphatic: Extensive, diffuse atherosclerotic vascular calcification. No enlarged lymph nodes. Reproductive:  Prostate obscured by artifact. Other: No intraperitoneal free fluid identified within limitations of artifact. No lower abdominal wall mass or hernia. Musculoskeletal: Bilateral total hip arthroplasties are again seen with associated streak artifact. There is also lateral plate and screw and cerclage wire fixation of an old periprosthetic right femur fracture at the level of the femoral stem. No acute fracture is identified within limitations of streak artifact. IMPRESSION: Bilateral total hip arthroplasties and old healed right periprosthetic femur fracture. No acute fracture identified. Electronically Signed   By: Logan Bores M.D.   On: 05/05/2016 21:40   Dg Hips Bilat W Or Wo Pelvis 3-4 Views  Result Date: 05/05/2016 CLINICAL DATA:  Bilateral hip  pain after fall left worse than right with large hematoma lateral to the tibia and fibula. EXAM: DG HIP (WITH OR WITHOUT PELVIS) 3-4V BILAT COMPARISON:  06/25/2015 pelvic and right hip radiographs. CT 06/25/2015 of the right femur. FINDINGS: Bilateral hip arthroplasties with cerclage wire fixation are noted bilaterally. Plate and screw fixation of the proximal right femur is noted since prior exam extending approximately 7.4 cm beyond the tip of the prosthetic femoral stem. Calcified atherosclerosis of the external iliac through femoral arteries bilaterally. Heterotopic new bone formation is seen bilaterally more so on the left. IMPRESSION: No acute osseous abnormality. Bilateral hip arthroplasties supported by by plate and screw fixation on the right and cerclage wires bilaterally. Electronically Signed   By: Ashley Royalty M.D.   On: 05/05/2016 19:57    Procedures Procedures (including critical care time)  Medications Ordered in ED Medications  iopamidol (ISOVUE-300) 61 % injection 100 mL (100 mLs Intravenous Canceled Entry 05/05/16 1931)  sodium chloride 0.9 % bolus 1,000 mL (1,000 mLs Intravenous New Bag/Given 05/05/16 2027)  morphine 4 MG/ML injection 2 mg (2 mg Intravenous Given 05/05/16 2026)  acetaminophen (TYLENOL) tablet 1,000 mg (1,000 mg Oral Given 05/05/16 2023)     Initial Impression / Assessment and Plan / ED Course  I have reviewed the triage vital signs and the nursing notes.  Pertinent labs & imaging results that were available during my care of the patient were reviewed by me and considered in my medical decision making (see chart for details).  Clinical Course     82 yo M With a chief complaint of a fall. Complaining mostly of hip pain bilaterally. I am unable to reproduce any pelvic pain but he does have pain with repositioning. Patient has a large hematoma to the left lower leg. There is some blistering to this area with the amount of edema. I will obtain a CT of the head  x-ray of the pelvis x-ray of the lower leg. There is some concern from EMS that the patient was septic. He does not have a fever here he is not altered. Rectal temperature was normal. I do not feel that a septic workup is indicated at this point.  Imaging negative, unable to ambulate the patient.  AKI. Discussed with hospitalist for  admission.  She is concerned about the swelling to his Left leg post injury requesting I discuss with ortho.   The patients results and plan were reviewed and discussed.   Any x-rays performed were independently reviewed by myself.   Differential diagnosis were considered with the presenting HPI.  Medications  iopamidol (ISOVUE-300) 61 % injection 100 mL (100 mLs Intravenous Canceled Entry 05/05/16 1931)  sodium chloride 0.9 % bolus 1,000 mL (1,000 mLs Intravenous New Bag/Given 05/05/16 2027)  morphine 4 MG/ML injection 2 mg (2 mg Intravenous Given 05/05/16 2026)  acetaminophen (TYLENOL) tablet 1,000 mg (1,000 mg Oral Given 05/05/16 2023)    Vitals:   05/05/16 1845 05/05/16 2245 05/05/16 2315 05/05/16 2345  BP: 154/100 117/76 118/69 118/76  Pulse: (!) 124 74 96 108  Resp: 21 16 19 22   SpO2: 96% 99% 91% 92%    Final diagnoses:  Fall, initial encounter  AKI (acute kidney injury) ([place])    Admission/ observation were discussed with the admitting physician, patient and/or family and they are comfortable with the plan.    Final Clinical Impressions(s) / ED Diagnoses   Final diagnoses:  Fall, initial encounter  AKI (acute kidney injury) [facility])    New Prescriptions New Prescriptions   No medications on file     Deno Etienne, DO 05/06/16 0007

## 2016-05-05 NOTE — ED Triage Notes (Signed)
Patient comes by EMS for a fall that happened this AM.  Patient declined transport this AM.  When care giver stopped by at 6pm she called EMS for bleeding to lower left leg and Altered patient per family.  Patient is A&Ox3 at this time.  BP normal in A Fib RVR at 145

## 2016-05-05 NOTE — H&P (Addendum)
History and Physical    Gerald Sandoval [id number] DOB: Jan 14, 1924 DOA: 05/05/2016  PCP: Jerlyn Ly, MD Consultants:  Erlinda Hong - orthopedics Patient coming from: independent living at [facility] for the last 4 months; NOK: niece - [phone]  Chief Complaint: leg pain s/p fall  HPI: Gerald Sandoval is a 82 y.o. male with medical history significant of afib on Eliquis presenting following a fall at home.  He was backing off of the elevator and caught his heels on the threshold.  [place] backwards and landed on his hips and also hit his head.  No known LOC.  Left lower leg is macerated and painful.  Hips are also painful.  He was complaining of pain and was quite confused.  He lives in independent living and has never married and has no children.  His closest family is a niece in [place].  He has a friend who lives in [place] who manages his financial affairs and who looks in on him regularly.  ED Course: Per Dr. Tyrone Nine: 82 yo M With a chief complaint of a fall. Complaining mostly of hip pain bilaterally. I am unable to reproduce any pelvic pain but he does have pain with repositioning. Patient has a large hematoma to the left lower leg. There is some blistering to this area with the amount of edema. I will obtain a CT of the head x-ray of the pelvis x-ray of the lower leg. There is some concern from EMS that the patient was septic. He does not have a fever here he is not altered. Rectal temperature was normal. I do not feel that a septic workup is indicated at this point.   Review of Systems: As per HPI; otherwise 10 point review of systems reviewed and negative.   Ambulatory Status:  Ambulates with a walker since he broke his femur  Past Medical History:  Diagnosis Date  . Abscess of left hip    infection left hip  . Anemia   . BPH (benign prostatic hyperplasia)   . Colon polyp   . Diastolic dysfunction   . Dysphagia    unspecified  . Emphysema   . Gastric ulcer   . Gastritis   .  Hernia    Rt. Inguinial  . History of colonic polyps   . Hypogonadism in male   . Kidney stones   . Osteoarthritis   . Osteoporosis   . Permanent atrial fibrillation ([place])   . RBBB (right bundle branch block)   . Rotator cuff injury     Past Surgical History:  Procedure Laterality Date  . INGUINAL HERNIA REPAIR     RT  . ORIF PERIPROSTHETIC FRACTURE Right 06/29/2015   Procedure: OPEN REDUCTION INTERNAL FIXATION (ORIF) RIGHT HIP PERIPROSTHETIC FRACTURE;  Surgeon: Leandrew Koyanagi, MD;  Location: [facility];  Service: Orthopedics;  Laterality: Right;  . Resection of Ribs    . TOTAL HIP ARTHROPLASTY     Both hips; Left in 2004, Right in 2007  . TRANSURETHRAL RESECTION OF PROSTATE      Social History   Social History  . Marital status: Single    Spouse name: N/A  . Number of children: 0  . Years of education: N/A   Occupational History  . Retired: [employer]    Social History Main Topics  . Smoking status: Never Smoker  . Smokeless tobacco: Never Used  . Alcohol use No  . Drug use: No  . Sexual activity: Not on file   Other  Topics Concern  . Not on file   Social History Narrative   Patient lives in [place] alone.   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   His closest living relative is his sister who is 1 and lives in nursing home.   No caffeine   Retired [employer]    Allergies  Allergen Reactions  . Primidone     Family History  Problem Relation Age of Onset  . Colon cancer Maternal Uncle   . Diabetes Maternal Aunt     Prior to Admission medications   Medication Sig Start Date End Date Taking? Authorizing Provider  amoxicillin (AMOXIL) 500 MG tablet Take 500 mg by mouth 2 (two) times daily.      Historical Provider, MD  apixaban (ELIQUIS) 2.5 MG TABS tablet Take 1 tablet (2.5 mg total) by mouth 2 (two) times daily. 07/03/15   Thurnell Lose, MD  Cyanocobalamin (VITAMIN B-12) 5000 MCG SUBL Place 1 tablet  under the tongue daily.      Historical Provider, MD  ergocalciferol (VITAMIN D2) 50000 UNITS capsule Take 50,000 Units by mouth once a week.     Historical Provider, MD  ferrous sulfate (IRON SUPPLEMENT) 325 (65 FE) MG tablet Take 325 mg by mouth daily with breakfast.      Historical Provider, MD  finasteride (PROSCAR) 5 MG tablet Take 5 mg by mouth daily.      Historical Provider, MD  HYDROcodone-acetaminophen (NORCO) 7.5-325 MG tablet Take 1-2 tablets by mouth every 6 (six) hours as needed for moderate pain. 06/29/15   Leandrew Koyanagi, MD  metoprolol (TOPROL-XL) 50 MG 24 hr tablet Take 1.5 tablets (75 mg total) by mouth daily. 04/15/11   Thompson Grayer, MD  potassium phosphate, monobasic, (K-PHOS ORIGINAL) 500 MG tablet Take 1,250 mg by mouth daily.      Historical Provider, MD  senna-docusate (SENOKOT-S) 8.6-50 MG tablet Take 1 tablet by mouth at bedtime. 07/03/15   Thurnell Lose, MD  sulfamethoxazole-trimethoprim (BACTRIM DS) 800-160 MG per tablet Take 1 tablet by mouth 2 (two) times daily.      Historical Provider, MD  tamsulosin (FLOMAX) 0.4 MG CAPS capsule Take 0.4 mg by mouth.    Historical Provider, MD    Physical Exam: Vitals:   05/05/16 1845 05/05/16 2245 05/05/16 2315 05/05/16 2345  BP: 154/100 117/76 118/69 118/76  Pulse: (!) 124 74 96 108  Resp: 21 16 19 22   SpO2: 96% 99% 91% 92%     General:  Appears calm and comfortable and is NAD; somnolent following pain medications but he is able to wake up and interact appropriately Eyes:  PERRL, EOMI, normal lids, iris ENT:  grossly normal hearing, lips & tongue, mmm Neck:  no LAD, masses or thyromegaly Cardiovascular:  Mild tachycardia, no m/r/g. No LE edema.  Respiratory:  CTA bilaterally, no w/r/r. Normal respiratory effort. Abdomen:  soft, ntnd, NABS Skin:  Left calf is at least twice the size of the right with an apparent hematoma.  There is superficial skin tearing and oozing of serous fluid atop the area of hematoma.     Musculoskeletal:  Large hematoma of the left lower leg; the left foot is cool but no moreso than the right.  The dorsalis pedis pulse on the left is present but more faint than on the right; posterior tibialis pulses are symmetric Psychiatric:  grossly normal mood and affect, speech fluent and appropriate, AOx3 Neurologic:  CN  2-12 grossly intact, moves all extremities in coordinated fashion, sensation intact  Labs on Admission: I have personally reviewed following labs and imaging studies  CBC:  Recent Labs Lab 05/05/16 1645  WBC 15.1*  NEUTROABS 13.7*  HGB 12.1*  HCT 35.4*  MCV 97.8  PLT 123XX123*   Basic Metabolic Panel:  Recent Labs Lab 05/05/16 1645  NA 135  K 4.6  CL 101  CO2 21*  GLUCOSE 174*  BUN 10  CREATININE 1.30*  CALCIUM 8.8*   GFR: CrCl cannot be calculated (Unknown ideal weight.). Liver Function Tests: No results for input(s): AST, ALT, ALKPHOS, BILITOT, PROT, ALBUMIN in the last 168 hours. No results for input(s): LIPASE, AMYLASE in the last 168 hours. No results for input(s): AMMONIA in the last 168 hours. Coagulation Profile: No results for input(s): INR, PROTIME in the last 168 hours. Cardiac Enzymes: No results for input(s): CKTOTAL, CKMB, CKMBINDEX, TROPONINI in the last 168 hours. BNP (last 3 results) No results for input(s): PROBNP in the last 8760 hours. HbA1C: No results for input(s): HGBA1C in the last 72 hours. CBG: No results for input(s): GLUCAP in the last 168 hours. Lipid Profile: No results for input(s): CHOL, HDL, LDLCALC, TRIG, CHOLHDL, LDLDIRECT in the last 72 hours. Thyroid Function Tests: No results for input(s): TSH, T4TOTAL, FREET4, T3FREE, THYROIDAB in the last 72 hours. Anemia Panel: No results for input(s): VITAMINB12, FOLATE, FERRITIN, TIBC, IRON, RETICCTPCT in the last 72 hours. Urine analysis:    Component Value Date/Time   COLORURINE YELLOW 06/25/2015 0911   APPEARANCEUR CLEAR 06/25/2015 0911   LABSPEC 1.014  06/25/2015 0911   PHURINE 6.0 06/25/2015 0911   GLUCOSEU NEGATIVE 06/25/2015 0911   HGBUR MODERATE (A) 06/25/2015 0911   BILIRUBINUR NEGATIVE 06/25/2015 0911   KETONESUR 15 (A) 06/25/2015 0911   PROTEINUR NEGATIVE 06/25/2015 0911   NITRITE NEGATIVE 06/25/2015 0911   LEUKOCYTESUR NEGATIVE 06/25/2015 0911    Creatinine Clearance: CrCl cannot be calculated (Unknown ideal weight.).  Sepsis Labs: @LABRCNTIP (procalcitonin:4,lacticidven:4) )No results found for this or any previous visit (from the past 240 hour(s)).   Radiological Exams on Admission: Dg Tibia/fibula Left  Result Date: 05/05/2016 CLINICAL DATA:  Pain after fall with hematoma noted laterally. EXAM: LEFT TIBIA AND FIBULA - 2 VIEW COMPARISON:  None. FINDINGS: No acute displaced fracture or bone destruction. Popliteal and tibial arteriosclerosis. The knee and ankle joints are maintained. Small ossicles are noted off the tip of the fibula consistent with old remote trauma. More focal calf soft tissue swelling noted posterolaterally the left leg consistent with reported hematoma. IMPRESSION: Soft tissue swelling of the posterolateral leg without acute osseous abnormality. Electronically Signed   By: Ashley Royalty M.D.   On: 05/05/2016 20:00   Ct Head Wo Contrast  Result Date: 05/05/2016 CLINICAL DATA:  Headache and neck pain after fall today. No loss of consciousness. No EXAM: CT HEAD WITHOUT CONTRAST CT CERVICAL SPINE WITHOUT CONTRAST TECHNIQUE: Multidetector CT imaging of the head and cervical spine was performed following the standard protocol without intravenous contrast. Multiplanar CT image reconstructions of the cervical spine were also generated. COMPARISON:  None. FINDINGS: CT HEAD FINDINGS BRAIN: There is sulcal and ventricular prominence consistent with superficial and central atrophy. No intraparenchymal hemorrhage, mass effect nor midline shift. There is a chronic stable moderate degree of patchy periventricular and  subcortical white matter hypodensity consistent with chronic small vessel ischemic disease. No acute large vascular territory infarcts. No abnormal extra-axial fluid collections. Basal cisterns are patent. VASCULAR: Moderate calcific atherosclerosis  of the carotid siphons. SKULL: No skull fracture. No significant scalp soft tissue swelling. The walls of the left maxillary sinus are somewhat thickened relative to right likely related to change from chronic sinusitis. There bold SINUSES/ORBITS: The mastoid air-cells are clear. The included paranasal sinuses are well-aerated.The included ocular globes and orbital contents are non-suspicious. OTHER: None. CT CERVICAL SPINE FINDINGS Alignment: Normal alignment of the cervical spine. Skull base and vertebrae: There are anterior wedge compression fractures of T1 and T3 possibly be with T3 as is not apparent on the lateral chest radiograph from 06/22/2007 and also incompletely included on the prior cervical spine CT from 06/25/2015. No retropulsion is identified. Soft tissues and spinal canal: No spinal stenosis nor intra canal hemorrhage. Disc levels: Chronic mild disc space narrowing at C5-6 with small posterior marginal osteophytes. Facet joints are maintained without malalignment. There is mild multilevel degenerative facet arthropathy more so on the right from C3 through C6 and on the left at C2-3. The atlantodental interval is maintained. Upper chest: Negative for acute disease Other: Atherosclerosis of the visualized aortic arch. IMPRESSION: Chronic small vessel ischemic disease of periventricular and subcortical white matter. No acute intracranial abnormality. There is mild anterior wedge compression deformity of the T3 vertebral body which appears new relative to the prior CT from 06/25/2015. It is however incompletely included on the prior exam. No retropulsion nor intraspinal hematoma is seen. Clinical correlation would be helpful over the T3 level. Non emergent  MRI or bone scan may also help for further evaluation. Stable mild compression of T1. No acute cervical spinal fracture. Degenerative disc disease at C5-6. Electronically Signed   By: Ashley Royalty M.D.   On: 05/05/2016 20:20   Ct Cervical Spine Wo Contrast  Result Date: 05/05/2016 CLINICAL DATA:  Headache and neck pain after fall today. No loss of consciousness. No EXAM: CT HEAD WITHOUT CONTRAST CT CERVICAL SPINE WITHOUT CONTRAST TECHNIQUE: Multidetector CT imaging of the head and cervical spine was performed following the standard protocol without intravenous contrast. Multiplanar CT image reconstructions of the cervical spine were also generated. COMPARISON:  None. FINDINGS: CT HEAD FINDINGS BRAIN: There is sulcal and ventricular prominence consistent with superficial and central atrophy. No intraparenchymal hemorrhage, mass effect nor midline shift. There is a chronic stable moderate degree of patchy periventricular and subcortical white matter hypodensity consistent with chronic small vessel ischemic disease. No acute large vascular territory infarcts. No abnormal extra-axial fluid collections. Basal cisterns are patent. VASCULAR: Moderate calcific atherosclerosis of the carotid siphons. SKULL: No skull fracture. No significant scalp soft tissue swelling. The walls of the left maxillary sinus are somewhat thickened relative to right likely related to change from chronic sinusitis. There bold SINUSES/ORBITS: The mastoid air-cells are clear. The included paranasal sinuses are well-aerated.The included ocular globes and orbital contents are non-suspicious. OTHER: None. CT CERVICAL SPINE FINDINGS Alignment: Normal alignment of the cervical spine. Skull base and vertebrae: There are anterior wedge compression fractures of T1 and T3 possibly be with T3 as is not apparent on the lateral chest radiograph from 06/22/2007 and also incompletely included on the prior cervical spine CT from 06/25/2015. No retropulsion is  identified. Soft tissues and spinal canal: No spinal stenosis nor intra canal hemorrhage. Disc levels: Chronic mild disc space narrowing at C5-6 with small posterior marginal osteophytes. Facet joints are maintained without malalignment. There is mild multilevel degenerative facet arthropathy more so on the right from C3 through C6 and on the left at C2-3. The atlantodental interval is maintained.  Upper chest: Negative for acute disease Other: Atherosclerosis of the visualized aortic arch. IMPRESSION: Chronic small vessel ischemic disease of periventricular and subcortical white matter. No acute intracranial abnormality. There is mild anterior wedge compression deformity of the T3 vertebral body which appears new relative to the prior CT from 06/25/2015. It is however incompletely included on the prior exam. No retropulsion nor intraspinal hematoma is seen. Clinical correlation would be helpful over the T3 level. Non emergent MRI or bone scan may also help for further evaluation. Stable mild compression of T1. No acute cervical spinal fracture. Degenerative disc disease at C5-6. Electronically Signed   By: Ashley Royalty M.D.   On: 05/05/2016 20:20   Ct Pelvis Wo Contrast  Result Date: 05/05/2016 CLINICAL DATA:  Bilateral hip pain after a fall today, greater on the right. Prior arthroplasties. EXAM: CT PELVIS WITHOUT CONTRAST TECHNIQUE: Multidetector CT imaging of the pelvis was performed following the standard protocol without intravenous contrast. COMPARISON:  Hip radiographs earlier today. CT abdomen and pelvis 06/26/2015. FINDINGS: Urinary Tract: Partially visualized right lower pole renal cyst. Bladder is largely obscured by streak artifact. Bowel: Left-sided colonic diverticulosis without evidence of diverticulitis. Vascular/Lymphatic: Extensive, diffuse atherosclerotic vascular calcification. No enlarged lymph nodes. Reproductive:  Prostate obscured by artifact. Other: No intraperitoneal free fluid  identified within limitations of artifact. No lower abdominal wall mass or hernia. Musculoskeletal: Bilateral total hip arthroplasties are again seen with associated streak artifact. There is also lateral plate and screw and cerclage wire fixation of an old periprosthetic right femur fracture at the level of the femoral stem. No acute fracture is identified within limitations of streak artifact. IMPRESSION: Bilateral total hip arthroplasties and old healed right periprosthetic femur fracture. No acute fracture identified. Electronically Signed   By: Logan Bores M.D.   On: 05/05/2016 21:40   Dg Hips Bilat W Or Wo Pelvis 3-4 Views  Result Date: 05/05/2016 CLINICAL DATA:  Bilateral hip pain after fall left worse than right with large hematoma lateral to the tibia and fibula. EXAM: DG HIP (WITH OR WITHOUT PELVIS) 3-4V BILAT COMPARISON:  06/25/2015 pelvic and right hip radiographs. CT 06/25/2015 of the right femur. FINDINGS: Bilateral hip arthroplasties with cerclage wire fixation are noted bilaterally. Plate and screw fixation of the proximal right femur is noted since prior exam extending approximately 7.4 cm beyond the tip of the prosthetic femoral stem. Calcified atherosclerosis of the external iliac through femoral arteries bilaterally. Heterotopic new bone formation is seen bilaterally more so on the left. IMPRESSION: No acute osseous abnormality. Bilateral hip arthroplasties supported by by plate and screw fixation on the right and cerclage wires bilaterally. Electronically Signed   By: Ashley Royalty M.D.   On: 05/05/2016 19:57    EKG: Independently reviewed.  Afib with rate 130; no evidence of acute ischemia, NSCSLT  Assessment/Plan Principal Problem:   Hematoma of leg, left, initial encounter Active Problems:   Goals of care, counseling/discussion   Persistent atrial fibrillation (HCC)   AKI (acute kidney injury) ([place])   Hyperglycemia   Chronic anemia   Hematoma, left lower leg -Patient  experienced a fall this AM and was brought into the ER this evening -He complains of generalized pain and CT pelvis was negative for fracture as was tib/fib x-ray on the left -However, patient has a substantial left lower leg hematoma -There is some concern for diminished pulses to the foot based on the size of the hematoma -The patient is on Eliquis, which complicates this issue -I have requested  that the ER consult orthopedics to evaluate; the patient may need evacuation of the hematoma and/or evaluation for compartment syndrome -Lactate has not been checked, will order -Very low suspicion for infection at this time despite elevated WBC count - suspect this is an acute stress response  AKI -Creatinine 1.30, was 0.79 on 07/24/15 -Will provide gentle hydration and follow  Afib on anticoagulation -Moderately rate-controlled -Will continue Toprol XL and add Lopressor as needed to maintain HR control -Hold Eliquis for now, SCD on the right and foot pump on the left for DVT prophylaxis -Will need to reconsider risk/benefit of anticoagulation at some point in the near future  Hypergylcemia -Glucose 174 -At this time, the risks of hypoglycemia from treatment are likely greater than the benefits of treating so will not treat -Will trend fasting glucose on AM labs  Anemia -Hgb 12.1, prior 12.8 -Will trend -This may be related to the hematoma and does need to be monitored  Goals of care -Patient is DNR -Would be open to surgical intervention if required -Otherwise, treat the treatable but no escalation of care - no ICU, no non-invasive ventilation, etc.  DVT prophylaxis: SCD on the right; foot pump on the left due to left lower leg hematoma Code Status: DNR - confirmed with family Family Communication: Family friend present throughout evaluation Disposition Plan:  To be determined Consults called: Orthopedics (by ER) Admission status: It is my clinical opinion that referral for  OBSERVATION is reasonable and necessary in this patient based on the above information provided. The aforementioned taken together are felt to place the patient at high risk for further clinical deterioration. However it is anticipated that the patient may be medically stable for discharge from the hospital within 24 to 48 hours.    Karmen Bongo MD [facility]  If 7PM-7AM, please contact night-coverage [email or web address] Password [email or web address]  05/06/2016, 12:09 AM

## 2016-05-05 NOTE — ED Notes (Signed)
Patient taken to CT.

## 2016-05-06 ENCOUNTER — Encounter (HOSPITAL_COMMUNITY): Payer: Self-pay | Admitting: *Deleted

## 2016-05-06 DIAGNOSIS — S8012XA Contusion of left lower leg, initial encounter: Secondary | ICD-10-CM | POA: Diagnosis not present

## 2016-05-06 DIAGNOSIS — R739 Hyperglycemia, unspecified: Secondary | ICD-10-CM | POA: Diagnosis present

## 2016-05-06 DIAGNOSIS — D649 Anemia, unspecified: Secondary | ICD-10-CM | POA: Diagnosis present

## 2016-05-06 LAB — CBC WITH DIFFERENTIAL/PLATELET
BASOS ABS: 0 10*3/uL (ref 0.0–0.1)
BASOS PCT: 0 %
Eosinophils Absolute: 0.2 10*3/uL (ref 0.0–0.7)
Eosinophils Relative: 2 %
HEMATOCRIT: 28.3 % — AB (ref 39.0–52.0)
HEMOGLOBIN: 10 g/dL — AB (ref 13.0–17.0)
LYMPHS PCT: 10 %
Lymphs Abs: 1 10*3/uL (ref 0.7–4.0)
MCH: 34.4 pg — ABNORMAL HIGH (ref 26.0–34.0)
MCHC: 35.3 g/dL (ref 30.0–36.0)
MCV: 97.3 fL (ref 78.0–100.0)
MONO ABS: 0.5 10*3/uL (ref 0.1–1.0)
Monocytes Relative: 5 %
NEUTROS ABS: 9 10*3/uL — AB (ref 1.7–7.7)
NEUTROS PCT: 84 %
Platelets: 106 10*3/uL — ABNORMAL LOW (ref 150–400)
RBC: 2.91 MIL/uL — AB (ref 4.22–5.81)
RDW: 13.9 % (ref 11.5–15.5)
WBC: 10.8 10*3/uL — AB (ref 4.0–10.5)

## 2016-05-06 LAB — BASIC METABOLIC PANEL
Anion gap: 10 (ref 5–15)
BUN: 13 mg/dL (ref 6–20)
CHLORIDE: 105 mmol/L (ref 101–111)
CO2: 20 mmol/L — ABNORMAL LOW (ref 22–32)
CREATININE: 1.19 mg/dL (ref 0.61–1.24)
Calcium: 8.3 mg/dL — ABNORMAL LOW (ref 8.9–10.3)
GFR calc Af Amer: >60 mL/min (ref >60)
GFR calc non Af Amer: 52 mL/min — ABNORMAL LOW (ref >60)
GLUCOSE: 177 mg/dL — AB (ref 65–99)
Potassium: 5 mmol/L (ref 3.5–5.1)
SODIUM: 135 mmol/L (ref 135–145)

## 2016-05-06 LAB — TYPE AND SCREEN
ABO/RH(D): A POS
ANTIBODY SCREEN: NEGATIVE

## 2016-05-06 LAB — GLUCOSE, CAPILLARY
GLUCOSE-CAPILLARY: 154 mg/dL — AB (ref 65–99)
Glucose-Capillary: 160 mg/dL — ABNORMAL HIGH (ref 65–99)
Glucose-Capillary: 131 mg/dL — ABNORMAL HIGH (ref 65–99)
Glucose-Capillary: 113 mg/dL — ABNORMAL HIGH (ref 65–99)

## 2016-05-06 LAB — LACTIC ACID, PLASMA
Lactic Acid, Venous: 2.1 mmol/L (ref 0.5–1.9)
Lactic Acid, Venous: 1.4 mmol/L (ref 0.5–1.9)

## 2016-05-06 LAB — MRSA PCR SCREENING: MRSA BY PCR: POSITIVE — AB

## 2016-05-06 LAB — CBC
HEMATOCRIT: 32 % — AB (ref 39.0–52.0)
Hemoglobin: 11.2 g/dL — ABNORMAL LOW (ref 13.0–17.0)
MCH: 34.1 pg — AB (ref 26.0–34.0)
MCHC: 35 g/dL (ref 30.0–36.0)
MCV: 97.6 fL (ref 78.0–100.0)
PLATELETS: 87 10*3/uL — AB (ref 150–400)
RBC: 3.28 MIL/uL — ABNORMAL LOW (ref 4.22–5.81)
RDW: 13.8 % (ref 11.5–15.5)
WBC: 12.4 10*3/uL — AB (ref 4.0–10.5)

## 2016-05-06 LAB — PROTIME-INR
INR: 1.19
Prothrombin Time: 15.1 seconds (ref 11.4–15.2)

## 2016-05-06 MED ORDER — SENNOSIDES-DOCUSATE SODIUM 8.6-50 MG PO TABS
1.0000 | ORAL_TABLET | Freq: Every day | ORAL | Status: DC
  Administered 2016-05-06 – 2016-05-08 (×4): 1 via ORAL
  Filled 2016-05-06 (×4): qty 1

## 2016-05-06 MED ORDER — LACTATED RINGERS IV SOLN
INTRAVENOUS | Status: DC
  Administered 2016-05-06 – 2016-05-07 (×3): via INTRAVENOUS

## 2016-05-06 MED ORDER — TAMSULOSIN HCL 0.4 MG PO CAPS
0.4000 mg | ORAL_CAPSULE | Freq: Every day | ORAL | Status: DC
  Administered 2016-05-06 – 2016-05-09 (×4): 0.4 mg via ORAL
  Filled 2016-05-06 (×4): qty 1

## 2016-05-06 MED ORDER — ONDANSETRON HCL 4 MG/2ML IJ SOLN
4.0000 mg | Freq: Four times a day (QID) | INTRAMUSCULAR | Status: DC | PRN
  Administered 2016-05-06: 4 mg via INTRAVENOUS
  Filled 2016-05-06: qty 2

## 2016-05-06 MED ORDER — MORPHINE SULFATE (PF) 2 MG/ML IV SOLN: 2.0000 mg | INTRAVENOUS | Status: DC | PRN

## 2016-05-06 MED ORDER — ACETAMINOPHEN 650 MG RE SUPP: 650.0000 mg | Freq: Four times a day (QID) | RECTAL | Status: DC | PRN

## 2016-05-06 MED ORDER — ONDANSETRON HCL 4 MG PO TABS: 4.0000 mg | ORAL_TABLET | Freq: Four times a day (QID) | ORAL | Status: DC | PRN

## 2016-05-06 MED ORDER — METOPROLOL SUCCINATE ER 50 MG PO TB24
75.0000 mg | ORAL_TABLET | Freq: Every day | ORAL | Status: DC
  Filled 2016-05-06: qty 1

## 2016-05-06 MED ORDER — DOCUSATE SODIUM 100 MG PO CAPS
100.0000 mg | ORAL_CAPSULE | Freq: Two times a day (BID) | ORAL | Status: DC
  Administered 2016-05-06 – 2016-05-09 (×8): 100 mg via ORAL
  Filled 2016-05-06 (×8): qty 1

## 2016-05-06 MED ORDER — METOPROLOL SUCCINATE ER 25 MG PO TB24
12.5000 mg | ORAL_TABLET | Freq: Every day | ORAL | Status: DC
  Administered 2016-05-07 – 2016-05-09 (×3): 12.5 mg via ORAL
  Filled 2016-05-06 (×4): qty 1

## 2016-05-06 MED ORDER — HYDROCODONE-ACETAMINOPHEN 7.5-325 MG PO TABS
1.0000 | ORAL_TABLET | Freq: Four times a day (QID) | ORAL | Status: DC | PRN
  Administered 2016-05-06: 1 via ORAL
  Filled 2016-05-06: qty 1

## 2016-05-06 MED ORDER — METOPROLOL TARTRATE 5 MG/5ML IV SOLN: 2.5000 mg | INTRAVENOUS | Status: DC | PRN

## 2016-05-06 MED ORDER — FINASTERIDE 5 MG PO TABS
5.0000 mg | ORAL_TABLET | Freq: Every day | ORAL | Status: DC
  Administered 2016-05-06 – 2016-05-09 (×4): 5 mg via ORAL
  Filled 2016-05-06 (×4): qty 1

## 2016-05-06 MED ORDER — ACETAMINOPHEN 325 MG PO TABS
650.0000 mg | ORAL_TABLET | Freq: Four times a day (QID) | ORAL | Status: DC | PRN
  Filled 2016-05-06: qty 2

## 2016-05-06 NOTE — Progress Notes (Signed)
[facility] Progress Note  Patient: Gerald Sandoval [id number]   PCP: Jerlyn Ly, MD DOB: [birth year]   DOA: 05/05/2016   DOS: 05/06/2016   Date of Service: the patient was seen and examined on 05/06/2016  Brief hospital course: Pt. with PMH of A. fib on anticoagulation with Apixaban; admitted on 05/05/2016, with complaint of fall, was found to have left leg hematoma. Currently further plan is continue close monitoring.  Assessment and Plan: 1.Hematoma, left lower leg -Patient experienced a fall and was brought into the ER - CT pelvis and x-ray tibia-fibula was negative for fracture  Orthopedics has been consulted also has not seen the patient today. We'll advance the diet and discuss with orthopedics again. clinically the patient is getting better.  AKI -Creatinine 1.30, was 0.79 on 07/24/15 -Will provide gentle hydration and follow  Afib on anticoagulation -Moderately rate-controlled -Will continue Toprol XL change dose to 12.5 given soft blood pressure. Continue IV Lopressor as needed to maintain HR control with holding parameters -Hold Eliquis for now, SCD on the right and foot pump on the left for DVT prophylaxis -Will need to reconsider risk/benefit of anticoagulation at some point in the near future  Hypergylcemia -Glucose 174 -At this time, the risks of hypoglycemia from treatment are likely greater than the benefits of treating so will not treat  Anemia Hemoglobin gradually trending downwards.  We will continue to closely monitor.  Goals of care -Patient is DNR -Would be open to surgical intervention if required  Pain management: When necessary Tylenol Activity: Consulted physical therapy Bowel regimen: last BM prior to admission Diet: Cardiac diet DVT Prophylaxis: mechanical compression device  Advance goals of care discussion: DNR/DNI  Family Communication: no family was present at bedside, at the time of interview.  Disposition:  Discharge to  SNF. Expected discharge date: 05/07/2016 pending stabilization of clinical condition,   Consultants: Orthopedics Procedures: None  Antibiotics: Anti-infectives    None      Subjective: Denies any acute complaint. Mentioned that the swelling of the left leg is significantly better as well as the pain in the left leg is significantly better. No nausea no vomiting. No pain anywhere else.  Objective: Physical Exam: Vitals:   05/06/16 0109 05/06/16 0417 05/06/16 0931 05/06/16 1700  BP: 123/65 123/67 92/63 99/79   Pulse: 93 99 82 75  Resp: 20 17 18 18   Temp: 99 F (37.2 C) 97.9 F (36.6 C) 98.1 F (36.7 C) 98.8 F (37.1 C)  TempSrc: Oral Oral Oral Oral  SpO2: 98% 99% 99% 96%  Weight: 58.5 kg (128 lb 15.5 oz)       Intake/Output Summary (Last 24 hours) at 05/06/16 1902 Last data filed at 05/06/16 1000  Gross per 24 hour  Intake              210 ml  Output                0 ml  Net              210 ml   Filed Weights   05/06/16 0109  Weight: 58.5 kg (128 lb 15.5 oz)    General: Alert, Awake and Oriented to Time, Place and Person. Appear in mild distress, affect appropriate Eyes: PERRL, Conjunctiva normal ENT: Oral Mucosa clear moist. Neck: no JVD, no Abnormal Mass Or lumps Cardiovascular: S1 and S2 Present, aortic systolic Murmur, Respiratory: Bilateral Air entry equal and Decreased, no use of accessory muscle, Clear to Auscultation, no Crackles, no  wheezes Abdomen: Bowel Sound present, Soft and no tenderness Skin: no redness, no Rash, no induration Extremities: left Pedal edema, no calf tenderness Neurologic: Grossly no focal neuro deficit. Bilaterally Equal motor strength  Data Reviewed: CBC:  Recent Labs Lab 05/05/16 1645 05/06/16 0059 05/06/16 0959  WBC 15.1* 12.4* 10.8*  NEUTROABS 13.7*  --  9.0*  HGB 12.1* 11.2* 10.0*  HCT 35.4* 32.0* 28.3*  MCV 97.8 97.6 97.3  PLT 121* 87* [id number]*   Basic Metabolic Panel:  Recent Labs Lab 05/05/16 1645 05/06/16 0059    NA 135 135  K 4.6 5.0  CL 101 105  CO2 21* 20*  GLUCOSE 174* 177*  BUN 10 13  CREATININE 1.30* 1.19  CALCIUM 8.8* 8.3*    Liver Function Tests: No results for input(s): AST, ALT, ALKPHOS, BILITOT, PROT, ALBUMIN in the last 168 hours. No results for input(s): LIPASE, AMYLASE in the last 168 hours. No results for input(s): AMMONIA in the last 168 hours. Coagulation Profile:  Recent Labs Lab 05/06/16 0059  INR 1.19   Cardiac Enzymes: No results for input(s): CKTOTAL, CKMB, CKMBINDEX, TROPONINI in the last 168 hours. BNP (last 3 results) No results for input(s): PROBNP in the last 8760 hours.  CBG:  Recent Labs Lab 05/06/16 0413 05/06/16 0741 05/06/16 1211 05/06/16 1714  GLUCAP 160* 131* 113* 154*    Studies: Dg Tibia/fibula Left  Result Date: 05/05/2016 CLINICAL DATA:  Pain after fall with hematoma noted laterally. EXAM: LEFT TIBIA AND FIBULA - 2 VIEW COMPARISON:  None. FINDINGS: No acute displaced fracture or bone destruction. Popliteal and tibial arteriosclerosis. The knee and ankle joints are maintained. Small ossicles are noted off the tip of the fibula consistent with old remote trauma. More focal calf soft tissue swelling noted posterolaterally the left leg consistent with reported hematoma. IMPRESSION: Soft tissue swelling of the posterolateral leg without acute osseous abnormality. Electronically Signed   By: Ashley Royalty M.D.   On: 05/05/2016 20:00   Ct Head Wo Contrast  Result Date: 05/05/2016 CLINICAL DATA:  Headache and neck pain after fall today. No loss of consciousness. No EXAM: CT HEAD WITHOUT CONTRAST CT CERVICAL SPINE WITHOUT CONTRAST TECHNIQUE: Multidetector CT imaging of the head and cervical spine was performed following the standard protocol without intravenous contrast. Multiplanar CT image reconstructions of the cervical spine were also generated. COMPARISON:  None. FINDINGS: CT HEAD FINDINGS BRAIN: There is sulcal and ventricular prominence  consistent with superficial and central atrophy. No intraparenchymal hemorrhage, mass effect nor midline shift. There is a chronic stable moderate degree of patchy periventricular and subcortical white matter hypodensity consistent with chronic small vessel ischemic disease. No acute large vascular territory infarcts. No abnormal extra-axial fluid collections. Basal cisterns are patent. VASCULAR: Moderate calcific atherosclerosis of the carotid siphons. SKULL: No skull fracture. No significant scalp soft tissue swelling. The walls of the left maxillary sinus are somewhat thickened relative to right likely related to change from chronic sinusitis. There bold SINUSES/ORBITS: The mastoid air-cells are clear. The included paranasal sinuses are well-aerated.The included ocular globes and orbital contents are non-suspicious. OTHER: None. CT CERVICAL SPINE FINDINGS Alignment: Normal alignment of the cervical spine. Skull base and vertebrae: There are anterior wedge compression fractures of T1 and T3 possibly be with T3 as is not apparent on the lateral chest radiograph from 06/22/2007 and also incompletely included on the prior cervical spine CT from 06/25/2015. No retropulsion is identified. Soft tissues and spinal canal: No spinal stenosis nor intra canal hemorrhage. Disc levels:  Chronic mild disc space narrowing at C5-6 with small posterior marginal osteophytes. Facet joints are maintained without malalignment. There is mild multilevel degenerative facet arthropathy more so on the right from C3 through C6 and on the left at C2-3. The atlantodental interval is maintained. Upper chest: Negative for acute disease Other: Atherosclerosis of the visualized aortic arch. IMPRESSION: Chronic small vessel ischemic disease of periventricular and subcortical white matter. No acute intracranial abnormality. There is mild anterior wedge compression deformity of the T3 vertebral body which appears new relative to the prior CT from  06/25/2015. It is however incompletely included on the prior exam. No retropulsion nor intraspinal hematoma is seen. Clinical correlation would be helpful over the T3 level. Non emergent MRI or bone scan may also help for further evaluation. Stable mild compression of T1. No acute cervical spinal fracture. Degenerative disc disease at C5-6. Electronically Signed   By: Ashley Royalty M.D.   On: 05/05/2016 20:20   Ct Cervical Spine Wo Contrast  Result Date: 05/05/2016 CLINICAL DATA:  Headache and neck pain after fall today. No loss of consciousness. No EXAM: CT HEAD WITHOUT CONTRAST CT CERVICAL SPINE WITHOUT CONTRAST TECHNIQUE: Multidetector CT imaging of the head and cervical spine was performed following the standard protocol without intravenous contrast. Multiplanar CT image reconstructions of the cervical spine were also generated. COMPARISON:  None. FINDINGS: CT HEAD FINDINGS BRAIN: There is sulcal and ventricular prominence consistent with superficial and central atrophy. No intraparenchymal hemorrhage, mass effect nor midline shift. There is a chronic stable moderate degree of patchy periventricular and subcortical white matter hypodensity consistent with chronic small vessel ischemic disease. No acute large vascular territory infarcts. No abnormal extra-axial fluid collections. Basal cisterns are patent. VASCULAR: Moderate calcific atherosclerosis of the carotid siphons. SKULL: No skull fracture. No significant scalp soft tissue swelling. The walls of the left maxillary sinus are somewhat thickened relative to right likely related to change from chronic sinusitis. There bold SINUSES/ORBITS: The mastoid air-cells are clear. The included paranasal sinuses are well-aerated.The included ocular globes and orbital contents are non-suspicious. OTHER: None. CT CERVICAL SPINE FINDINGS Alignment: Normal alignment of the cervical spine. Skull base and vertebrae: There are anterior wedge compression fractures of T1 and  T3 possibly be with T3 as is not apparent on the lateral chest radiograph from 06/22/2007 and also incompletely included on the prior cervical spine CT from 06/25/2015. No retropulsion is identified. Soft tissues and spinal canal: No spinal stenosis nor intra canal hemorrhage. Disc levels: Chronic mild disc space narrowing at C5-6 with small posterior marginal osteophytes. Facet joints are maintained without malalignment. There is mild multilevel degenerative facet arthropathy more so on the right from C3 through C6 and on the left at C2-3. The atlantodental interval is maintained. Upper chest: Negative for acute disease Other: Atherosclerosis of the visualized aortic arch. IMPRESSION: Chronic small vessel ischemic disease of periventricular and subcortical white matter. No acute intracranial abnormality. There is mild anterior wedge compression deformity of the T3 vertebral body which appears new relative to the prior CT from 06/25/2015. It is however incompletely included on the prior exam. No retropulsion nor intraspinal hematoma is seen. Clinical correlation would be helpful over the T3 level. Non emergent MRI or bone scan may also help for further evaluation. Stable mild compression of T1. No acute cervical spinal fracture. Degenerative disc disease at C5-6. Electronically Signed   By: Ashley Royalty M.D.   On: 05/05/2016 20:20   Ct Pelvis Wo Contrast  Result Date: 05/05/2016 CLINICAL  DATA:  Bilateral hip pain after a fall today, greater on the right. Prior arthroplasties. EXAM: CT PELVIS WITHOUT CONTRAST TECHNIQUE: Multidetector CT imaging of the pelvis was performed following the standard protocol without intravenous contrast. COMPARISON:  Hip radiographs earlier today. CT abdomen and pelvis 06/26/2015. FINDINGS: Urinary Tract: Partially visualized right lower pole renal cyst. Bladder is largely obscured by streak artifact. Bowel: Left-sided colonic diverticulosis without evidence of diverticulitis.  Vascular/Lymphatic: Extensive, diffuse atherosclerotic vascular calcification. No enlarged lymph nodes. Reproductive:  Prostate obscured by artifact. Other: No intraperitoneal free fluid identified within limitations of artifact. No lower abdominal wall mass or hernia. Musculoskeletal: Bilateral total hip arthroplasties are again seen with associated streak artifact. There is also lateral plate and screw and cerclage wire fixation of an old periprosthetic right femur fracture at the level of the femoral stem. No acute fracture is identified within limitations of streak artifact. IMPRESSION: Bilateral total hip arthroplasties and old healed right periprosthetic femur fracture. No acute fracture identified. Electronically Signed   By: Logan Bores M.D.   On: 05/05/2016 21:40   Dg Hips Bilat W Or Wo Pelvis 3-4 Views  Result Date: 05/05/2016 CLINICAL DATA:  Bilateral hip pain after fall left worse than right with large hematoma lateral to the tibia and fibula. EXAM: DG HIP (WITH OR WITHOUT PELVIS) 3-4V BILAT COMPARISON:  06/25/2015 pelvic and right hip radiographs. CT 06/25/2015 of the right femur. FINDINGS: Bilateral hip arthroplasties with cerclage wire fixation are noted bilaterally. Plate and screw fixation of the proximal right femur is noted since prior exam extending approximately 7.4 cm beyond the tip of the prosthetic femoral stem. Calcified atherosclerosis of the external iliac through femoral arteries bilaterally. Heterotopic new bone formation is seen bilaterally more so on the left. IMPRESSION: No acute osseous abnormality. Bilateral hip arthroplasties supported by by plate and screw fixation on the right and cerclage wires bilaterally. Electronically Signed   By: Ashley Royalty M.D.   On: 05/05/2016 19:57     Scheduled Meds: . docusate sodium  100 mg Oral BID  . finasteride  5 mg Oral Daily  . metoprolol succinate  75 mg Oral Daily  . senna-docusate  1 tablet Oral QHS  . tamsulosin  0.4 mg Oral  Daily   Continuous Infusions: . lactated ringers 75 mL/hr at 05/06/16 1331   PRN Meds: acetaminophen **OR** acetaminophen, HYDROcodone-acetaminophen, metoprolol, morphine injection, ondansetron **OR** ondansetron (ZOFRAN) IV  Time spent: 30 minutes  Author: Berle Mull, MD [facility] Pager: [phone] 05/06/2016 7:02 PM  If 7PM-7AM, please contact night-coverage at [email or web address], password [facility]

## 2016-05-06 NOTE — ED Notes (Signed)
Report called to [place].  Patient stable to be taken

## 2016-05-06 NOTE — Care Management Obs Status (Signed)
[place] NOTIFICATION   Patient Details  Name: Gerald Sandoval MRN: [id number] Date of Birth: Mar 24, 1924   Medicare Observation Status Notification Given:  Yes    Boysie Bonebrake, Rory Percy, RN 05/06/2016, 10:38 AM

## 2016-05-06 NOTE — Consult Note (Signed)
[place] Nurse wound consult note Reason for Consult: left leg hematoma Wound type: trauma from fall Measurement: 19cm x 9.5cm x 0.1cm  Wound bed: dark purple hematoma with two areas of superficial partial thickness skin peeling centrally.  Area is not fluctuant at this time Drainage (amount, consistency, odor) minimal, serosanguinous  Periwound: intact  Dressing procedure/placement/frequency: Change non adherent to xeroform for antibacterial and drying effect. Cover with kerlix and ACE wrap. Monitor for changes every other day  Discussed POC with patient and bedside nurse.  Re consult if needed, will not follow at this time. Thanks  Cherae Marton [employer], RN,CWOCN, CNS [phone])

## 2016-05-07 DIAGNOSIS — N179 Acute kidney failure, unspecified: Secondary | ICD-10-CM | POA: Diagnosis present

## 2016-05-07 DIAGNOSIS — N400 Enlarged prostate without lower urinary tract symptoms: Secondary | ICD-10-CM | POA: Diagnosis present

## 2016-05-07 DIAGNOSIS — N390 Urinary tract infection, site not specified: Secondary | ICD-10-CM | POA: Diagnosis not present

## 2016-05-07 DIAGNOSIS — M1990 Unspecified osteoarthritis, unspecified site: Secondary | ICD-10-CM | POA: Diagnosis present

## 2016-05-07 DIAGNOSIS — E43 Unspecified severe protein-calorie malnutrition: Secondary | ICD-10-CM | POA: Diagnosis present

## 2016-05-07 DIAGNOSIS — I4510 Unspecified right bundle-branch block: Secondary | ICD-10-CM | POA: Diagnosis present

## 2016-05-07 DIAGNOSIS — I481 Persistent atrial fibrillation: Secondary | ICD-10-CM | POA: Diagnosis present

## 2016-05-07 DIAGNOSIS — M810 Age-related osteoporosis without current pathological fracture: Secondary | ICD-10-CM | POA: Diagnosis present

## 2016-05-07 DIAGNOSIS — Z96643 Presence of artificial hip joint, bilateral: Secondary | ICD-10-CM | POA: Diagnosis present

## 2016-05-07 DIAGNOSIS — D649 Anemia, unspecified: Secondary | ICD-10-CM | POA: Diagnosis present

## 2016-05-07 DIAGNOSIS — Y92009 Unspecified place in unspecified non-institutional (private) residence as the place of occurrence of the external cause: Secondary | ICD-10-CM | POA: Diagnosis not present

## 2016-05-07 DIAGNOSIS — Z7901 Long term (current) use of anticoagulants: Secondary | ICD-10-CM | POA: Diagnosis not present

## 2016-05-07 DIAGNOSIS — S8012XA Contusion of left lower leg, initial encounter: Principal | ICD-10-CM

## 2016-05-07 DIAGNOSIS — G9340 Encephalopathy, unspecified: Secondary | ICD-10-CM | POA: Diagnosis present

## 2016-05-07 DIAGNOSIS — W010XXA Fall on same level from slipping, tripping and stumbling without subsequent striking against object, initial encounter: Secondary | ICD-10-CM | POA: Diagnosis present

## 2016-05-07 DIAGNOSIS — Z681 Body mass index (BMI) 19 or less, adult: Secondary | ICD-10-CM | POA: Diagnosis not present

## 2016-05-07 DIAGNOSIS — Z79899 Other long term (current) drug therapy: Secondary | ICD-10-CM | POA: Diagnosis not present

## 2016-05-07 DIAGNOSIS — R739 Hyperglycemia, unspecified: Secondary | ICD-10-CM | POA: Diagnosis present

## 2016-05-07 DIAGNOSIS — Z66 Do not resuscitate: Secondary | ICD-10-CM | POA: Diagnosis present

## 2016-05-07 LAB — COMPREHENSIVE METABOLIC PANEL
ALBUMIN: 3.7 g/dL (ref 3.5–5.0)
ALT: 28 U/L (ref 17–63)
AST: 48 U/L — AB (ref 15–41)
Alkaline Phosphatase: 50 U/L (ref 38–126)
Anion gap: 12 (ref 5–15)
BUN: 15 mg/dL (ref 6–20)
CHLORIDE: 102 mmol/L (ref 101–111)
CO2: 22 mmol/L (ref 22–32)
CREATININE: 1.3 mg/dL — AB (ref 0.61–1.24)
Calcium: 8.6 mg/dL — ABNORMAL LOW (ref 8.9–10.3)
GFR calc Af Amer: 54 mL/min — ABNORMAL LOW (ref >60)
GFR, EST NON AFRICAN AMERICAN: 47 mL/min — AB (ref >60)
GLUCOSE: 140 mg/dL — AB (ref 65–99)
POTASSIUM: 3.3 mmol/L — AB (ref 3.5–5.1)
SODIUM: 136 mmol/L (ref 135–145)
Total Bilirubin: 1.4 mg/dL — ABNORMAL HIGH (ref 0.3–1.2)
Total Protein: 6 g/dL — ABNORMAL LOW (ref 6.5–8.1)

## 2016-05-07 LAB — CBC WITH DIFFERENTIAL/PLATELET
BASOS ABS: 0 10*3/uL (ref 0.0–0.1)
BASOS PCT: 0 %
EOS ABS: 0.1 10*3/uL (ref 0.0–0.7)
EOS PCT: 0 %
HCT: 31.4 % — ABNORMAL LOW (ref 39.0–52.0)
Hemoglobin: 10.8 g/dL — ABNORMAL LOW (ref 13.0–17.0)
LYMPHS PCT: 7 %
Lymphs Abs: 0.9 10*3/uL (ref 0.7–4.0)
MCH: 34.1 pg — ABNORMAL HIGH (ref 26.0–34.0)
MCHC: 34.4 g/dL (ref 30.0–36.0)
MCV: 99.1 fL (ref 78.0–100.0)
MONO ABS: 0.9 10*3/uL (ref 0.1–1.0)
Monocytes Relative: 7 %
Neutro Abs: 11.8 10*3/uL — ABNORMAL HIGH (ref 1.7–7.7)
Neutrophils Relative %: 86 %
PLATELETS: 103 10*3/uL — AB (ref 150–400)
RBC: 3.17 MIL/uL — AB (ref 4.22–5.81)
RDW: 14.2 % (ref 11.5–15.5)
WBC: 13.7 10*3/uL — AB (ref 4.0–10.5)

## 2016-05-07 LAB — AMMONIA: AMMONIA: 20 umol/L (ref 9–35)

## 2016-05-07 LAB — URINE CULTURE

## 2016-05-07 LAB — TSH: TSH: 1.818 u[IU]/mL (ref 0.350–4.500)

## 2016-05-07 LAB — GLUCOSE, CAPILLARY: Glucose-Capillary: 148 mg/dL — ABNORMAL HIGH (ref 65–99)

## 2016-05-07 LAB — MAGNESIUM: MAGNESIUM: 1.8 mg/dL (ref 1.7–2.4)

## 2016-05-07 LAB — CK: Total CK: 325 U/L (ref 49–397)

## 2016-05-07 LAB — VITAMIN B12: VITAMIN B 12: 1740 pg/mL — AB (ref 180–914)

## 2016-05-07 MED ORDER — QUETIAPINE FUMARATE 25 MG PO TABS
25.0000 mg | ORAL_TABLET | Freq: Two times a day (BID) | ORAL | Status: DC
Start: 2016-05-07 — End: 2016-05-08
  Administered 2016-05-07 (×2): 25 mg via ORAL
  Filled 2016-05-07 (×2): qty 1

## 2016-05-07 MED ORDER — DEXTROSE 5 % IV SOLN
1.0000 g | INTRAVENOUS | Status: DC
  Administered 2016-05-07 – 2016-05-09 (×3): 1 g via INTRAVENOUS
  Filled 2016-05-07 (×3): qty 10

## 2016-05-07 MED ORDER — FOLIC ACID 1 MG PO TABS
1.0000 mg | ORAL_TABLET | Freq: Every day | ORAL | Status: DC
  Administered 2016-05-07 – 2016-05-09 (×3): 1 mg via ORAL
  Filled 2016-05-07 (×3): qty 1

## 2016-05-07 MED ORDER — POTASSIUM CHLORIDE CRYS ER 20 MEQ PO TBCR
40.0000 meq | EXTENDED_RELEASE_TABLET | Freq: Once | ORAL | Status: AC
  Administered 2016-05-07: 40 meq via ORAL
  Filled 2016-05-07: qty 2

## 2016-05-07 MED ORDER — SODIUM CHLORIDE 0.9 % IV SOLN
INTRAVENOUS | Status: DC
  Administered 2016-05-07: 17:00:00 via INTRAVENOUS

## 2016-05-07 NOTE — Progress Notes (Addendum)
[facility] Progress Note  Patient: Gerald Sandoval [id number]   PCP: Jerlyn Ly, MD DOB: [birth year]   DOA: 05/05/2016   DOS: 05/07/2016   Date of Service: the patient was seen and examined on 05/07/2016  Brief hospital course: Pt. with PMH of A. fib on anticoagulation with Apixaban; admitted on 05/05/2016, with complaint of fall, was found to have left leg hematoma. Currently further plan is continue close monitoring.  Assessment and Plan: 1. Hematoma, left lower leg - Patient experienced a fall and was brought into the ER - CT pelvis and x-ray tibia-fibula was negative for fracture  - appreciate orthopedic input, continue wound care.  clinically the patient is getting better.  2. AKI - Creatinine 1.30, was 0.79 on 07/24/15 - Will provide gentle hydration and follow  3. Afib on anticoagulation - Moderately rate-controlled - Will continue Toprol XL change dose to 12.5 given soft blood pressure. Continue IV Lopressor as needed to maintain HR control with holding parameters - Hold Eliquis for now, SCD on the right and foot pump on the left for DVT prophylaxis - Will need to reconsider risk/benefit of anticoagulation at some point in the near future  4. Hypergylcemia - Glucose stable. Monitor daily.   5. Anemia - Hemoglobin stable at present - We will continue to closely monitor.  6. Goals of care - Patient is DNR - Would be open to surgical intervention if required  7. Acute encephalopathy  Delirium, possible UTI Pt is talking about district attorney and not oriented to place and time.  This is new and likely UTI. Use seroquel, recheck urine culture, start on ceftriaxone. Check TSH, [id number], start on folic acid.   Pain management: When necessary Tylenol Activity: Consulted physical therapy Bowel regimen: last BM prior to admission Diet: Cardiac diet DVT Prophylaxis: mechanical compression device  Advance goals of care discussion: DNR/DNI  Family  Communication: no family was present at bedside, at the time of interview.  Disposition:  Discharge to SNF. Expected discharge date: 05/08/2016 pending stabilization of clinical condition,   Consultants: Orthopedics Procedures: None  Antibiotics: Anti-infectives    None      Subjective:   Objective: Physical Exam: Vitals:   05/06/16 1700 05/06/16 1955 05/07/16 0619 05/07/16 0900  BP: 99/79 92/65 (!) 169/84 (!) 154/85  Pulse: 75 87 (!) 54 94  Resp: 18 18 20 18   Temp: 98.8 F (37.1 C) 97.6 F (36.4 C) 98.4 F (36.9 C) 97.6 F (36.4 C)  TempSrc: Oral Oral  Oral  SpO2: 96% 100% 98% 100%  Weight:  60.3 kg (132 lb 15 oz)      Intake/Output Summary (Last 24 hours) at 05/07/16 1601 Last data filed at 05/07/16 1300  Gross per 24 hour  Intake          1561.25 ml  Output                0 ml  Net          1561.25 ml   Filed Weights   05/06/16 0109 05/06/16 1955  Weight: 58.5 kg (128 lb 15.5 oz) 60.3 kg (132 lb 15 oz)    General: Alert, Awake and Oriented to Person. Appear in mild distress, affect appropriate Eyes: PERRL, Conjunctiva normal ENT: Oral Mucosa clear moist. Neck: no JVD, no Abnormal Mass Or lumps Cardiovascular: S1 and S2 Present, aortic systolic Murmur, Respiratory: Bilateral Air entry equal and Decreased, no use of accessory muscle, Clear to Auscultation, no Crackles, no wheezes Abdomen: Bowel  Sound present, Soft and no tenderness Skin: no redness, no Rash, no induration Extremities: left Pedal edema, no calf tenderness Neurologic: Grossly no focal neuro deficit. Bilaterally Equal motor strength No asterixis.  Data Reviewed: CBC:  Recent Labs Lab 05/05/16 1645 05/06/16 0059 05/06/16 0959 05/07/16 0539  WBC 15.1* 12.4* 10.8* 13.7*  NEUTROABS 13.7*  --  9.0* 11.8*  HGB 12.1* 11.2* 10.0* 10.8*  HCT 35.4* 32.0* 28.3* 31.4*  MCV 97.8 97.6 97.3 99.1  PLT 121* 87* 106* [id number]*   Basic Metabolic Panel:  Recent Labs Lab 05/05/16 1645 05/06/16 0059  05/07/16 0539  NA 135 135 136  K 4.6 5.0 3.3*  CL 101 105 102  CO2 21* 20* 22  GLUCOSE 174* 177* 140*  BUN 10 13 15   CREATININE 1.30* 1.19 1.30*  CALCIUM 8.8* 8.3* 8.6*  MG  --   --  1.8    Liver Function Tests:  Recent Labs Lab 05/07/16 0539  AST 48*  ALT 28  ALKPHOS 50  BILITOT 1.4*  PROT 6.0*  ALBUMIN 3.7   No results for input(s): LIPASE, AMYLASE in the last 168 hours. No results for input(s): AMMONIA in the last 168 hours. Coagulation Profile:  Recent Labs Lab 05/06/16 0059  INR 1.19   Cardiac Enzymes:  Recent Labs Lab 05/07/16 0539  CKTOTAL 325   BNP (last 3 results) No results for input(s): PROBNP in the last 8760 hours.  CBG:  Recent Labs Lab 05/06/16 0413 05/06/16 0741 05/06/16 1211 05/06/16 1714 05/07/16 1215  GLUCAP 160* 131* 113* 154* 148*    Studies: No results found.   Scheduled Meds: . docusate sodium  100 mg Oral BID  . finasteride  5 mg Oral Daily  . folic acid  1 mg Oral Daily  . metoprolol succinate  12.5 mg Oral Daily  . potassium chloride  40 mEq Oral Once  . QUEtiapine  25 mg Oral BID  . senna-docusate  1 tablet Oral QHS  . tamsulosin  0.4 mg Oral Daily   Continuous Infusions:  PRN Meds: acetaminophen **OR** acetaminophen, HYDROcodone-acetaminophen, ondansetron **OR** ondansetron (ZOFRAN) IV  Time spent: 30 minutes  Author: Berle Mull, MD [facility] Pager: [phone] 05/07/2016 4:01 PM  If 7PM-7AM, please contact night-coverage at [email or web address], password [facility]

## 2016-05-07 NOTE — Evaluation (Signed)
Physical Therapy Evaluation Patient Details Name: Gerald Sandoval MRN: [id number] DOB: [birth year] Today's Date: 05/07/2016   History of Present Illness   Gerald Sandoval is a 82 y.o. male with medical history significant of afib on Eliquis presenting following a fall at home coming out of the elevator.  Imaging negative for fracture..  Clinical Impression  Pt admitted with/for fall, sustaining lacerations/hematoma, but no fractures.  Pt's mentation has declined and he needs minimal assist at this time..  Pt currently limited functionally due to the problems listed. ( See problems list.)   Pt will benefit from PT to maximize function and safety in order to get ready for next venue listed below.     Follow Up Recommendations SNF;Other (comment) (the ?POA wants to keep him out of SNF)    Equipment Recommendations  Other (comment) (TBA)    Recommendations for Other Services       Precautions / Restrictions Precautions Precautions: Fall      Mobility  Bed Mobility                  Transfers Overall transfer level: Needs assistance Equipment used: Rolling walker (2 wheeled) Transfers: Sit to/from Stand Sit to Stand: Min assist         General transfer comment: assist to come forward.  Pt used safe transfer technique.  Ambulation/Gait                Stairs            Wheelchair Mobility    Modified Rankin (Stroke Patients Only)       Balance Overall balance assessment: Needs assistance Sitting-balance support: No upper extremity supported;Single extremity supported Sitting balance-Leahy Scale: Fair     Standing balance support: Bilateral upper extremity supported Standing balance-Leahy Scale: Poor Standing balance comment: RW for stability, esp while pt's mentation is decreased                             Pertinent Vitals/Pain Pain Assessment: Faces Faces Pain Scale: Hurts little more Pain Location: R knee Pain Descriptors /  Indicators: Aching;Sore Pain Intervention(s): Monitored during session;Repositioned    Home Living Family/patient expects to be discharged to:: Other (Comment) (independent living) Living Arrangements: Alone   Type of Home: Independent living facility Home Access: Elevator     Home Layout: One level Home Equipment: Walker - 4 wheels      Prior Function Level of Independence: Independent         Comments: per pt used what I assumed from his description is a rollator.  However pt's answers are suspect due to his decreased mentation.     Hand Dominance   Dominant Hand: Right    Extremity/Trunk Assessment   Upper Extremity Assessment: Defer to OT evaluation           Lower Extremity Assessment: Generalized weakness;Overall WFL for tasks assessed;RLE deficits/detail;LLE deficits/detail RLE Deficits / Details: mild deficits likely due to knee pain LLE Deficits / Details: grossly 4/5     Communication   Communication: HOH  Cognition Arousal/Alertness: Awake/alert Behavior During Therapy: WFL for tasks assessed/performed;Agitated Overall Cognitive Status: Impaired/Different from baseline Area of Impairment: Orientation;Attention;Memory;Following commands;Safety/judgement;Awareness;Problem solving Orientation Level: Place;Time;Situation Current Attention Level: Sustained Memory: Decreased short-term memory Following Commands: Follows one step commands with increased time   Awareness: Emergent Problem Solving: Slow processing      General Comments      Exercises  Assessment/Plan    PT Assessment Patient needs continued PT services  PT Problem List Decreased strength;Decreased activity tolerance;Decreased balance;Decreased mobility;Decreased knowledge of use of DME;Pain          PT Treatment Interventions DME instruction;Gait training;Functional mobility training;Therapeutic activities;Balance training;Patient/family education    PT Goals (Current  goals can be found in the Care Plan section)  Acute Rehab PT Goals Patient Stated Goal: pt unable today due to a downturn in mentation PT Goal Formulation: Patient unable to participate in goal setting Time For Goal Achievement: 05/21/16 Potential to Achieve Goals: Good    Frequency Min 3X/week   Barriers to discharge Decreased caregiver support      Co-evaluation               End of Session   Activity Tolerance: Patient limited by pain;Patient tolerated treatment well;Other (comment) (limited by mentation) Patient left: in chair;with call bell/phone within reach;with chair alarm set Nurse Communication: Mobility status    Functional Assessment Tool Used: clinical judgement Functional Limitation: Mobility: Walking and moving around Mobility: Walking and Moving Around Current Status [id number]): At least 20 percent but less than 40 percent impaired, limited or restricted Mobility: Walking and Moving Around Goal Status [phone]): At least 1 percent but less than 20 percent impaired, limited or restricted    Time: 1010-1030 PT Time Calculation (min) (ACUTE ONLY): 20 min   Charges:   PT Evaluation $PT Eval Moderate Complexity: 1 Procedure     PT G Codes:   PT G-Codes **NOT FOR INPATIENT CLASS** Functional Assessment Tool Used: clinical judgement Functional Limitation: Mobility: Walking and moving around Mobility: Walking and Moving Around Current Status [id number]): At least 20 percent but less than 40 percent impaired, limited or restricted Mobility: Walking and Moving Around Goal Status [phone]): At least 1 percent but less than 20 percent impaired, limited or restricted    Burnard Bunting 05/07/2016, 10:52 AM 05/07/2016  Donnella Sham, [place] [phone]  (pager)

## 2016-05-07 NOTE — Progress Notes (Signed)
Ceftriaxone for UTI per pharmacy ordered. Ceftriaxone does not need renal adjustment.   Plan: -ceftriaxone 1g IV q24h -pharmacy to sign off as no adjustment needed.  Boyde Grieco D. Samadhi Mahurin, PharmD, BCPS Clinical Pharmacist Pager: [phone] 05/07/2016 4:12 PM

## 2016-05-07 NOTE — Consult Note (Signed)
ORTHOPAEDIC CONSULTATION  REQUESTING PHYSICIAN: Lavina Hamman, MD  Chief Complaint: LLE hematoma  HPI: Gerald Sandoval is a 82 y.o. male who presents with LLE hematoma s/p fall.  He is on eliquis for afib.  His LLE skin is macerated from the fall.  xrays and CT scans have been negative.  He lives in [place] and has niece [place].  He is drowsy today on evaluation.    Past Medical History:  Diagnosis Date  . Abscess of left hip    infection left hip  . Anemia   . BPH (benign prostatic hyperplasia)   . Colon polyp   . Diastolic dysfunction   . Dysphagia    unspecified  . Emphysema   . Gastric ulcer   . Gastritis   . Hernia    Rt. Inguinial  . History of colonic polyps   . Hypogonadism in male   . Kidney stones   . Osteoarthritis   . Osteoporosis   . Permanent atrial fibrillation ([place])   . RBBB (right bundle branch block)   . Rotator cuff injury    Past Surgical History:  Procedure Laterality Date  . INGUINAL HERNIA REPAIR     RT  . ORIF PERIPROSTHETIC FRACTURE Right 06/29/2015   Procedure: OPEN REDUCTION INTERNAL FIXATION (ORIF) RIGHT HIP PERIPROSTHETIC FRACTURE;  Surgeon: Leandrew Koyanagi, MD;  Location: [facility];  Service: Orthopedics;  Laterality: Right;  . Resection of Ribs    . TOTAL HIP ARTHROPLASTY     Both hips; Left in 2004, Right in 2007  . TRANSURETHRAL RESECTION OF PROSTATE     Social History   Social History  . Marital status: Single    Spouse name: N/A  . Number of children: 0  . Years of education: N/A   Occupational History  . Retired: [employer]    Social History Main Topics  . Smoking status: Never Smoker  . Smokeless tobacco: Never Used  . Alcohol use No  . Drug use: No  . Sexual activity: Not Asked   Other Topics Concern  . None   Social History Narrative   Patient lives in [place] alone.   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   His closest living relative is his sister who is 6 and lives in  nursing home.   No caffeine   Retired [employer]   Family History  Problem Relation Age of Onset  . Colon cancer Maternal Uncle   . Diabetes Maternal Aunt    - negative except otherwise stated in the family history section Allergies  Allergen Reactions  . Primidone    Prior to Admission medications   Medication Sig Start Date End Date Taking? Authorizing Provider  apixaban (ELIQUIS) 2.5 MG TABS tablet Take 1 tablet (2.5 mg total) by mouth 2 (two) times daily. 07/03/15  Yes Thurnell Lose, MD  cetirizine (ZYRTEC) 10 MG tablet Take 10 mg by mouth daily.   Yes Historical Provider, MD  Cyanocobalamin (VITAMIN B-12) 2500 MCG SUBL Place 2,500 mcg under the tongue daily.    Yes Historical Provider, MD  ferrous sulfate (IRON SUPPLEMENT) 325 (65 FE) MG tablet Take 325 mg by mouth daily with breakfast.     Yes Historical Provider, MD  finasteride (PROSCAR) 5 MG tablet Take 5 mg by mouth daily.     Yes Historical Provider, MD  metoprolol (TOPROL-XL) 50 MG 24 hr tablet Take 1.5 tablets (75 mg total) by  mouth daily. 04/15/11  Yes Thompson Grayer, MD  mirtazapine (REMERON) 15 MG tablet Take 15 mg by mouth at bedtime.   Yes Historical Provider, MD  senna-docusate (SENOKOT-S) 8.6-50 MG tablet Take 1 tablet by mouth at bedtime. Patient taking differently: Take 1 tablet by mouth daily.  07/03/15  Yes Thurnell Lose, MD  sulfamethoxazole-trimethoprim (BACTRIM DS) 800-160 MG per tablet Take 1 tablet by mouth 2 (two) times daily.     Yes Historical Provider, MD  tamsulosin (FLOMAX) 0.4 MG CAPS capsule Take 0.4 mg by mouth daily.    Yes Historical Provider, MD  HYDROcodone-acetaminophen (NORCO) 7.5-325 MG tablet Take 1-2 tablets by mouth every 6 (six) hours as needed for moderate pain. Patient not taking: Reported on 05/06/2016 06/29/15   Leandrew Koyanagi, MD   Dg Tibia/fibula Left  Result Date: 05/05/2016 CLINICAL DATA:  Pain after fall with hematoma noted laterally. EXAM: LEFT TIBIA AND FIBULA - 2 VIEW  COMPARISON:  None. FINDINGS: No acute displaced fracture or bone destruction. Popliteal and tibial arteriosclerosis. The knee and ankle joints are maintained. Small ossicles are noted off the tip of the fibula consistent with old remote trauma. More focal calf soft tissue swelling noted posterolaterally the left leg consistent with reported hematoma. IMPRESSION: Soft tissue swelling of the posterolateral leg without acute osseous abnormality. Electronically Signed   By: Ashley Royalty M.D.   On: 05/05/2016 20:00   Ct Head Wo Contrast  Result Date: 05/05/2016 CLINICAL DATA:  Headache and neck pain after fall today. No loss of consciousness. No EXAM: CT HEAD WITHOUT CONTRAST CT CERVICAL SPINE WITHOUT CONTRAST TECHNIQUE: Multidetector CT imaging of the head and cervical spine was performed following the standard protocol without intravenous contrast. Multiplanar CT image reconstructions of the cervical spine were also generated. COMPARISON:  None. FINDINGS: CT HEAD FINDINGS BRAIN: There is sulcal and ventricular prominence consistent with superficial and central atrophy. No intraparenchymal hemorrhage, mass effect nor midline shift. There is a chronic stable moderate degree of patchy periventricular and subcortical white matter hypodensity consistent with chronic small vessel ischemic disease. No acute large vascular territory infarcts. No abnormal extra-axial fluid collections. Basal cisterns are patent. VASCULAR: Moderate calcific atherosclerosis of the carotid siphons. SKULL: No skull fracture. No significant scalp soft tissue swelling. The walls of the left maxillary sinus are somewhat thickened relative to right likely related to change from chronic sinusitis. There bold SINUSES/ORBITS: The mastoid air-cells are clear. The included paranasal sinuses are well-aerated.The included ocular globes and orbital contents are non-suspicious. OTHER: None. CT CERVICAL SPINE FINDINGS Alignment: Normal alignment of the  cervical spine. Skull base and vertebrae: There are anterior wedge compression fractures of T1 and T3 possibly be with T3 as is not apparent on the lateral chest radiograph from 06/22/2007 and also incompletely included on the prior cervical spine CT from 06/25/2015. No retropulsion is identified. Soft tissues and spinal canal: No spinal stenosis nor intra canal hemorrhage. Disc levels: Chronic mild disc space narrowing at C5-6 with small posterior marginal osteophytes. Facet joints are maintained without malalignment. There is mild multilevel degenerative facet arthropathy more so on the right from C3 through C6 and on the left at C2-3. The atlantodental interval is maintained. Upper chest: Negative for acute disease Other: Atherosclerosis of the visualized aortic arch. IMPRESSION: Chronic small vessel ischemic disease of periventricular and subcortical white matter. No acute intracranial abnormality. There is mild anterior wedge compression deformity of the T3 vertebral body which appears new relative to the prior CT from 06/25/2015.  It is however incompletely included on the prior exam. No retropulsion nor intraspinal hematoma is seen. Clinical correlation would be helpful over the T3 level. Non emergent MRI or bone scan may also help for further evaluation. Stable mild compression of T1. No acute cervical spinal fracture. Degenerative disc disease at C5-6. Electronically Signed   By: Ashley Royalty M.D.   On: 05/05/2016 20:20   Ct Cervical Spine Wo Contrast  Result Date: 05/05/2016 CLINICAL DATA:  Headache and neck pain after fall today. No loss of consciousness. No EXAM: CT HEAD WITHOUT CONTRAST CT CERVICAL SPINE WITHOUT CONTRAST TECHNIQUE: Multidetector CT imaging of the head and cervical spine was performed following the standard protocol without intravenous contrast. Multiplanar CT image reconstructions of the cervical spine were also generated. COMPARISON:  None. FINDINGS: CT HEAD FINDINGS BRAIN: There is  sulcal and ventricular prominence consistent with superficial and central atrophy. No intraparenchymal hemorrhage, mass effect nor midline shift. There is a chronic stable moderate degree of patchy periventricular and subcortical white matter hypodensity consistent with chronic small vessel ischemic disease. No acute large vascular territory infarcts. No abnormal extra-axial fluid collections. Basal cisterns are patent. VASCULAR: Moderate calcific atherosclerosis of the carotid siphons. SKULL: No skull fracture. No significant scalp soft tissue swelling. The walls of the left maxillary sinus are somewhat thickened relative to right likely related to change from chronic sinusitis. There bold SINUSES/ORBITS: The mastoid air-cells are clear. The included paranasal sinuses are well-aerated.The included ocular globes and orbital contents are non-suspicious. OTHER: None. CT CERVICAL SPINE FINDINGS Alignment: Normal alignment of the cervical spine. Skull base and vertebrae: There are anterior wedge compression fractures of T1 and T3 possibly be with T3 as is not apparent on the lateral chest radiograph from 06/22/2007 and also incompletely included on the prior cervical spine CT from 06/25/2015. No retropulsion is identified. Soft tissues and spinal canal: No spinal stenosis nor intra canal hemorrhage. Disc levels: Chronic mild disc space narrowing at C5-6 with small posterior marginal osteophytes. Facet joints are maintained without malalignment. There is mild multilevel degenerative facet arthropathy more so on the right from C3 through C6 and on the left at C2-3. The atlantodental interval is maintained. Upper chest: Negative for acute disease Other: Atherosclerosis of the visualized aortic arch. IMPRESSION: Chronic small vessel ischemic disease of periventricular and subcortical white matter. No acute intracranial abnormality. There is mild anterior wedge compression deformity of the T3 vertebral body which appears new  relative to the prior CT from 06/25/2015. It is however incompletely included on the prior exam. No retropulsion nor intraspinal hematoma is seen. Clinical correlation would be helpful over the T3 level. Non emergent MRI or bone scan may also help for further evaluation. Stable mild compression of T1. No acute cervical spinal fracture. Degenerative disc disease at C5-6. Electronically Signed   By: Ashley Royalty M.D.   On: 05/05/2016 20:20   Ct Pelvis Wo Contrast  Result Date: 05/05/2016 CLINICAL DATA:  Bilateral hip pain after a fall today, greater on the right. Prior arthroplasties. EXAM: CT PELVIS WITHOUT CONTRAST TECHNIQUE: Multidetector CT imaging of the pelvis was performed following the standard protocol without intravenous contrast. COMPARISON:  Hip radiographs earlier today. CT abdomen and pelvis 06/26/2015. FINDINGS: Urinary Tract: Partially visualized right lower pole renal cyst. Bladder is largely obscured by streak artifact. Bowel: Left-sided colonic diverticulosis without evidence of diverticulitis. Vascular/Lymphatic: Extensive, diffuse atherosclerotic vascular calcification. No enlarged lymph nodes. Reproductive:  Prostate obscured by artifact. Other: No intraperitoneal free fluid identified within limitations  of artifact. No lower abdominal wall mass or hernia. Musculoskeletal: Bilateral total hip arthroplasties are again seen with associated streak artifact. There is also lateral plate and screw and cerclage wire fixation of an old periprosthetic right femur fracture at the level of the femoral stem. No acute fracture is identified within limitations of streak artifact. IMPRESSION: Bilateral total hip arthroplasties and old healed right periprosthetic femur fracture. No acute fracture identified. Electronically Signed   By: Logan Bores M.D.   On: 05/05/2016 21:40   Dg Hips Bilat W Or Wo Pelvis 3-4 Views  Result Date: 05/05/2016 CLINICAL DATA:  Bilateral hip pain after fall left worse than  right with large hematoma lateral to the tibia and fibula. EXAM: DG HIP (WITH OR WITHOUT PELVIS) 3-4V BILAT COMPARISON:  06/25/2015 pelvic and right hip radiographs. CT 06/25/2015 of the right femur. FINDINGS: Bilateral hip arthroplasties with cerclage wire fixation are noted bilaterally. Plate and screw fixation of the proximal right femur is noted since prior exam extending approximately 7.4 cm beyond the tip of the prosthetic femoral stem. Calcified atherosclerosis of the external iliac through femoral arteries bilaterally. Heterotopic new bone formation is seen bilaterally more so on the left. IMPRESSION: No acute osseous abnormality. Bilateral hip arthroplasties supported by by plate and screw fixation on the right and cerclage wires bilaterally. Electronically Signed   By: Ashley Royalty M.D.   On: 05/05/2016 19:57   - pertinent xrays, CT, MRI studies were reviewed and independently interpreted  Positive ROS: All other systems have been reviewed and were otherwise negative with the exception of those mentioned in the HPI and as above.  Physical Exam: General: Alert, no acute distress Cardiovascular: No pedal edema Respiratory: No cyanosis, no use of accessory musculature GI: No organomegaly, abdomen is soft and non-tender Skin: No lesions in the area of chief complaint Neurologic: Sensation intact distally Psychiatric: Patient is competent for consent with normal mood and affect Lymphatic: No axillary or cervical lymphadenopathy  MUSCULOSKELETAL:  - compartments soft, without evidence of compartment syndrome,  No pain with passive stretch - significant skin tears, maceration and erythema related to hematoma - foot wwp  Assessment: LLE hematoma and skin maceration  Plan: - agree with xeroform and dry dressings per wound nurse - no surgical issues - ortho signed off  Thank you for the consult and the opportunity to see Mr. Vankirk  N. Eduard Roux, MD [place] 12:55 PM

## 2016-05-08 DIAGNOSIS — E43 Unspecified severe protein-calorie malnutrition: Secondary | ICD-10-CM | POA: Insufficient documentation

## 2016-05-08 LAB — CBC WITH DIFFERENTIAL/PLATELET
BASOS ABS: 0 10*3/uL (ref 0.0–0.1)
Basophils Relative: 0 %
EOS PCT: 2 %
Eosinophils Absolute: 0.2 10*3/uL (ref 0.0–0.7)
HCT: 27 % — ABNORMAL LOW (ref 39.0–52.0)
Hemoglobin: 9.3 g/dL — ABNORMAL LOW (ref 13.0–17.0)
LYMPHS ABS: 0.8 10*3/uL (ref 0.7–4.0)
LYMPHS PCT: 8 %
MCH: 33.9 pg (ref 26.0–34.0)
MCHC: 34.4 g/dL (ref 30.0–36.0)
MCV: 98.5 fL (ref 78.0–100.0)
Monocytes Absolute: 0.5 10*3/uL (ref 0.1–1.0)
Monocytes Relative: 4 %
NEUTROS ABS: 9 10*3/uL — AB (ref 1.7–7.7)
NEUTROS PCT: 86 %
PLATELETS: 86 10*3/uL — AB (ref 150–400)
RBC: 2.74 MIL/uL — AB (ref 4.22–5.81)
RDW: 14.4 % (ref 11.5–15.5)
WBC: 10.5 10*3/uL (ref 4.0–10.5)

## 2016-05-08 LAB — COMPREHENSIVE METABOLIC PANEL
ALBUMIN: 3 g/dL — AB (ref 3.5–5.0)
ALT: 25 U/L (ref 17–63)
AST: 43 U/L — AB (ref 15–41)
Alkaline Phosphatase: 40 U/L (ref 38–126)
Anion gap: 7 (ref 5–15)
BILIRUBIN TOTAL: 1.4 mg/dL — AB (ref 0.3–1.2)
BUN: 13 mg/dL (ref 6–20)
CO2: 24 mmol/L (ref 22–32)
CREATININE: 0.88 mg/dL (ref 0.61–1.24)
Calcium: 8.2 mg/dL — ABNORMAL LOW (ref 8.9–10.3)
Chloride: 109 mmol/L (ref 101–111)
GFR calc Af Amer: >60 mL/min (ref >60)
GLUCOSE: 153 mg/dL — AB (ref 65–99)
POTASSIUM: 3.6 mmol/L (ref 3.5–5.1)
Sodium: 140 mmol/L (ref 135–145)
TOTAL PROTEIN: 5.2 g/dL — AB (ref 6.5–8.1)

## 2016-05-08 LAB — MAGNESIUM: Magnesium: 1.7 mg/dL (ref 1.7–2.4)

## 2016-05-08 MED ORDER — CHLORHEXIDINE GLUCONATE CLOTH 2 % EX PADS
6.0000 | MEDICATED_PAD | Freq: Every day | CUTANEOUS | Status: DC
  Administered 2016-05-09: 6 via TOPICAL

## 2016-05-08 MED ORDER — ENSURE ENLIVE PO LIQD
237.0000 mL | Freq: Two times a day (BID) | ORAL | Status: DC
  Administered 2016-05-08 – 2016-05-09 (×2): 237 mL via ORAL

## 2016-05-08 MED ORDER — MUPIROCIN 2 % EX OINT
1.0000 "application " | TOPICAL_OINTMENT | Freq: Two times a day (BID) | CUTANEOUS | Status: DC
  Administered 2016-05-08 – 2016-05-09 (×3): 1 via NASAL
  Filled 2016-05-08 (×3): qty 22

## 2016-05-08 MED ORDER — QUETIAPINE FUMARATE 25 MG PO TABS
25.0000 mg | ORAL_TABLET | Freq: Every day | ORAL | Status: DC
  Administered 2016-05-08: 25 mg via ORAL
  Filled 2016-05-08: qty 1

## 2016-05-08 NOTE — Progress Notes (Signed)
[facility] Progress Note  Patient: Gerald Sandoval [id number]   PCP: Jerlyn Ly, MD DOB: 11-Feb-1924   DOA: 05/05/2016   DOS: 05/08/2016   Date of Service: the patient was seen and examined on 05/08/2016  Brief hospital course: Pt. with PMH of A. fib on anticoagulation with Apixaban; admitted on 05/05/2016, with complaint of fall, was found to have left leg hematoma. Complicated by agitation and delirium  Assessment and Plan: 1. Hematoma, left lower leg - Patient experienced a fall and was brought into the ER - CT pelvis and x-ray tibia-fibula was negative for fracture  - appreciate orthopedic input, continue wound care.  -Eliquis stopped  2. Acute encephalopathy  -suspect delirium not UTI -B12 and TSh-WNL, improved after seroquel last pm -Dr.Patel started Ceftriaxone yesterday, will stop if repeat Cx negative tomorrow -improved today  3. Afib on anticoagulation - HR controlled - Will continue Toprol XL at 12.5 given soft blood pressure. - Eliquis on hold due to above - Will need to re-assess risk/benefit of anticoagulation in 4-6weeks  4. Hypergylcemia - Glucose stable. Monitor daily.   5. Anemia - Hemoglobin stable at present, monitor  6. Goals of care - Patient is DNR - Would be open to surgical intervention if required  7. 2. AKI - Creatinine 1.30, was 0.79 on 07/24/15 - improved with hydration, stop IVF  DVT Prophylaxis: SCDs  Advance goals of care discussion: DNR/DNI  Family Communication: no family was present at bedside, at the time of interview.  Disposition: Back to ALF tomorrow if stable  Consultants: Orthopedics Procedures: None  Antibiotics: Anti-infectives    Start     Dose/Rate Route Frequency Ordered Stop   05/07/16 1700  cefTRIAXone (ROCEPHIN) 1 g in dextrose 5 % 50 mL IVPB     1 g 100 mL/hr over 30 Minutes Intravenous Every 24 hours 05/07/16 1613        Subjective:   Objective: Physical Exam: Vitals:   05/07/16 1818  05/07/16 2132 05/08/16 0559 05/08/16 1020  BP: (!) 149/72 (!) 145/73 134/75 104/64  Pulse: 90 91 81 90  Resp: 18 18 17 16   Temp: 98 F (36.7 C) 99.9 F (37.7 C) 97.7 F (36.5 C) 97.6 F (36.4 C)  TempSrc: Oral Axillary Oral Oral  SpO2: 100% 98% 100% 98%  Weight:        Intake/Output Summary (Last 24 hours) at 05/08/16 1435 Last data filed at 05/08/16 1344  Gross per 24 hour  Intake          1182.66 ml  Output                0 ml  Net          1182.66 ml   Filed Weights   05/06/16 0109 05/06/16 1955  Weight: 58.5 kg (128 lb 15.5 oz) 60.3 kg (132 lb 15 oz)    General: somnolent, arousable, oriented to self and place, no distress Eyes: PERRL, Conjunctiva normal ENT: Oral Mucosa clear moist. Neck: no JVD, no Abnormal Mass Or lumps Cardiovascular: S1 and S2 Present, aortic systolic Murmur, Respiratory: CTAB Abdomen: Bowel Sound present, Soft and no tenderness Skin: no redness, no Rash, no induration Extremities: L lower leg witb large hematoma and wound with dressing Neurologic: Grossly no focal neuro deficit. Bilaterally Equal motor strength No asterixis.  Data Reviewed: CBC:  Recent Labs Lab 05/05/16 1645 05/06/16 0059 05/06/16 0959 05/07/16 0539 05/08/16 1000  WBC 15.1* 12.4* 10.8* 13.7* 10.5  NEUTROABS 13.7*  --  9.0* 11.8* 9.0*  HGB 12.1* 11.2* 10.0* 10.8* 9.3*  HCT 35.4* 32.0* 28.3* 31.4* 27.0*  MCV 97.8 97.6 97.3 99.1 98.5  PLT 121* 87* 106* 103* 86*   Basic Metabolic Panel:  Recent Labs Lab 05/05/16 1645 05/06/16 0059 05/07/16 0539 05/08/16 1000  NA 135 135 136 140  K 4.6 5.0 3.3* 3.6  CL 101 105 102 109  CO2 21* 20* 22 24  GLUCOSE 174* 177* 140* 153*  BUN 10 13 15 13   CREATININE 1.30* 1.19 1.30* 0.88  CALCIUM 8.8* 8.3* 8.6* 8.2*  MG  --   --  1.8 1.7    Liver Function Tests:  Recent Labs Lab 05/07/16 0539 05/08/16 1000  AST 48* 43*  ALT 28 25  ALKPHOS 50 40  BILITOT 1.4* 1.4*  PROT 6.0* 5.2*  ALBUMIN 3.7 3.0*   No results for  input(s): LIPASE, AMYLASE in the last 168 hours.  Recent Labs Lab 05/07/16 1629  AMMONIA 20   Coagulation Profile:  Recent Labs Lab 05/06/16 0059  INR 1.19   Cardiac Enzymes:  Recent Labs Lab 05/07/16 0539  CKTOTAL 325   BNP (last 3 results) No results for input(s): PROBNP in the last 8760 hours.  CBG:  Recent Labs Lab 05/06/16 0413 05/06/16 0741 05/06/16 1211 05/06/16 1714 05/07/16 1215  GLUCAP 160* 131* 113* 154* 148*    Studies: No results found.   Scheduled Meds: . cefTRIAXone (ROCEPHIN)  IV  1 g Intravenous Q24H  . Chlorhexidine Gluconate Cloth  6 each Topical Q0600  . docusate sodium  100 mg Oral BID  . finasteride  5 mg Oral Daily  . folic acid  1 mg Oral Daily  . metoprolol succinate  12.5 mg Oral Daily  . mupirocin ointment  1 application Nasal BID  . QUEtiapine  25 mg Oral QHS  . senna-docusate  1 tablet Oral QHS  . tamsulosin  0.4 mg Oral Daily   Continuous Infusions:  PRN Meds: acetaminophen **OR** acetaminophen, HYDROcodone-acetaminophen, ondansetron **OR** ondansetron (ZOFRAN) IV  Time spent: 30 minutes  Author: Domenic Polite, MD [facility] Pager: [phone] 05/08/2016 2:35 PM  If 7PM-7AM, please contact night-coverage at [email or web address], password [facility]

## 2016-05-08 NOTE — Care Management Important Message (Signed)
Important Message  Patient Details  Name: Gerald Sandoval MRN: [id number] Date of Birth: 09-Jul-1923   Medicare Important Message Given:  Yes    [name], Rory Percy, RN 05/08/2016, 11:25 AM

## 2016-05-08 NOTE — Progress Notes (Signed)
Initial Nutrition Assessment  DOCUMENTATION CODES:   Severe malnutrition in context of chronic illness  INTERVENTION:  Provide Ensure Enlive po BID, each supplement provides 350 kcal and 20 grams of protein.  Encourage adequate PO intake.   NUTRITION DIAGNOSIS:   Malnutrition (severe) related to chronic illness as evidenced by severe depletion of body fat, severe depletion of muscle mass.  GOAL:   Patient will meet greater than or equal to 90% of their needs  MONITOR:   PO intake, Supplement acceptance, Labs, Weight trends, I & O's  REASON FOR ASSESSMENT:   Low Braden    ASSESSMENT:   Pt. with PMH of A. fib on anticoagulation with Apixaban; admitted on 05/05/2016, with complaint of fall, was found to have left leg hematoma.  Pt reports having a good appetite. Meal completion 25% at breakfast this AM, 0% at lunch. Pt reports usually consuming 2 meals a day with Ensure shakes. Pt reports he does not consume lunch. Pt with no significant weight loss. RD to order Ensure to aid in caloric and protein needs.   Nutrition-Focused physical exam completed. Findings are severe fat depletion, severe muscle depletion, and no edema.   Labs:   Diet Order:  DIET SOFT Room service appropriate? Yes; Fluid consistency: Thin  Skin:  Wound (see comment) (Hematoma L leg)  Last BM:  12/10  Height:   Ht Readings from Last 1 Encounters:  02/22/16 5\' 9"  (1.753 m)    Weight:   Wt Readings from Last 1 Encounters:  05/06/16 132 lb 15 oz (60.3 kg)    Ideal Body Weight:  72.7 kg  BMI:  Body mass index is 19.63 kg/m.  Estimated Nutritional Needs:   Kcal:  1650-1850  Protein:  60-75 grams  Fluid:  1.6 - 1.8 L/day  EDUCATION NEEDS:   No education needs identified at this time  Corrin Parker, MS, RD, LDN Pager # [phone] After hours/ weekend pager # [phone]

## 2016-05-08 NOTE — Progress Notes (Signed)
Physical Therapy Treatment Patient Details Name: Gerald Sandoval MRN: [id number] DOB: 24-Sep-1923 Today's Date: 05/08/2016    History of Present Illness  Gerald Sandoval is a 82 y.o. male with medical history significant of afib on Eliquis presenting following a fall at home coming out of the elevator.  Imaging negative for fracture..    PT Comments    Pt with improving mentation and participation.  Pt completed warm up exercise with graded resistance, transitions to EOB and and ambulation with RW.  Follow Up Recommendations  SNF     Equipment Recommendations   (TBA)    Recommendations for Other Services       Precautions / Restrictions Precautions Precautions: Fall    Mobility  Bed Mobility Overal bed mobility: Needs Assistance Bed Mobility: Sidelying to Sit   Sidelying to sit: Min assist;+2 for safety/equipment       General bed mobility comments: truncal assist up via L elbow,   pt scooted with effort out to EOB  Transfers Overall transfer level: Needs assistance Equipment used: Rolling walker (2 wheeled) Transfers: Sit to/from [facility] Sit to Stand: Min assist Stand pivot transfers: Min guard       General transfer comment: assist to come forward.  Pt used safe transfer technique with minimal cues  Ambulation/Gait Ambulation/Gait assistance: Min assist;+2 safety/equipment Ambulation Distance (Feet): 100 Feet Assistive device: Rolling walker (2 wheeled) Gait Pattern/deviations: Step-through pattern   Gait velocity interpretation: Below normal speed for age/gender General Gait Details: weak-kneed gait.  Improves with rest in between longer walks.   Stairs            Wheelchair Mobility    Modified Rankin (Stroke Patients Only)       Balance Overall balance assessment: Needs assistance   Sitting balance-Leahy Scale: Fair     Standing balance support: Bilateral upper extremity supported Standing balance-Leahy Scale:  Poor Standing balance comment: reliant on the RW                    Cognition Arousal/Alertness: Awake/alert Behavior During Therapy: WFL for tasks assessed/performed;Agitated Overall Cognitive Status: Impaired/Different from baseline (but improving with redirection) Area of Impairment: Orientation;Attention;Following commands;Safety/judgement;Awareness Orientation Level: Situation;Time Current Attention Level: Selective Memory: Decreased short-term memory Following Commands: Follows one step commands with increased time   Awareness: Emergent Problem Solving: Slow processing      Exercises      General Comments        Pertinent Vitals/Pain Pain Assessment: Faces Faces Pain Scale: Hurts a little bit Pain Location: R knee Pain Descriptors / Indicators: Aching;Sore Pain Intervention(s): Limited activity within patient's tolerance    Home Living                      Prior Function            PT Goals (current goals can now be found in the care plan section) Acute Rehab PT Goals PT Goal Formulation: Patient unable to participate in goal setting Time For Goal Achievement: 05/21/16 Potential to Achieve Goals: Good Progress towards PT goals: Progressing toward goals    Frequency    Min 3X/week      PT Plan Current plan remains appropriate    Co-evaluation             End of Session   Activity Tolerance: Patient tolerated treatment well;Patient limited by fatigue Patient left: in chair;with call bell/phone within reach;with chair alarm set  Time: 1430-1500 PT Time Calculation (min) (ACUTE ONLY): 30 min  Charges:  $Gait Training: 8-22 mins $Therapeutic Activity: 8-22 mins                    G CodesTessie Fass Gerald Sandoval 05/08/2016, 5:16 PM  05/08/2016  Donnella Sham, [place] [phone]  (pager)

## 2016-05-09 LAB — BASIC METABOLIC PANEL
Anion gap: 8 (ref 5–15)
BUN: 19 mg/dL (ref 6–20)
CHLORIDE: 106 mmol/L (ref 101–111)
CO2: 25 mmol/L (ref 22–32)
CREATININE: 0.79 mg/dL (ref 0.61–1.24)
Calcium: 8.4 mg/dL — ABNORMAL LOW (ref 8.9–10.3)
GFR calc Af Amer: >60 mL/min (ref >60)
GFR calc non Af Amer: >60 mL/min (ref >60)
GLUCOSE: 117 mg/dL — AB (ref 65–99)
Potassium: 4.1 mmol/L (ref 3.5–5.1)
SODIUM: 139 mmol/L (ref 135–145)

## 2016-05-09 LAB — CBC
HCT: 28.3 % — ABNORMAL LOW (ref 39.0–52.0)
HEMOGLOBIN: 9.7 g/dL — AB (ref 13.0–17.0)
MCH: 33.8 pg (ref 26.0–34.0)
MCHC: 34.3 g/dL (ref 30.0–36.0)
MCV: 98.6 fL (ref 78.0–100.0)
Platelets: 109 10*3/uL — ABNORMAL LOW (ref 150–400)
RBC: 2.87 MIL/uL — ABNORMAL LOW (ref 4.22–5.81)
RDW: 14.2 % (ref 11.5–15.5)
WBC: 9.2 10*3/uL (ref 4.0–10.5)

## 2016-05-09 LAB — TYPE AND SCREEN
ABO/RH(D): A POS
Antibody Screen: NEGATIVE

## 2016-05-09 MED ORDER — QUETIAPINE FUMARATE 25 MG PO TABS: 25.0000 mg | ORAL_TABLET | Freq: Every day | ORAL | 0 refills | Status: DC

## 2016-05-09 NOTE — Care Management Note (Addendum)
Case Management Note  Patient Details  Name: Gerald Sandoval MRN: [id number] Date of Birth: 1924-04-22  Subjective/Objective:            CM continuing to follow for progression and d/c planning.         Action/Plan: 05/09/16 Pt for d/c today, this CM spoke with Ms Gerald Sandoval who assist pt with his care and planning. She is aware of plan to d/c today and has spoken with pt's niece, who is HCPOA. They wish for pt to return to [facility] and are currently awaiting a response from that facility about assisted living availability. If assisted living is available they would like for pt to d/c to that area, if it is not available at this time they will take the pt back to his independent living area and hire 24 hr care for him. Await family decision, pt will d/c today.  4pm Family will take pt back to independent living with aides to provide 24 hr care. This CM has requested [facility] to provide [place] as they have been a preferred provider of [place] and that facility has no one available to arrange [facility] services today.  Pt niece, Ms Gerald Sandoval is aware of the arrangement for [facility] services to be provided by [facility] and the plan for dressing changes to be provided beginning Saturday, May 11, 2016 as the dressing has been changed today by the pt RN, Caitlyn.  Caitlyn will provide d/c instruction to pt family/care givers at the time of d/c.  Expected Discharge Date:     05/09/2016             Expected Discharge Plan:   Home with [facility] services.  In-House Referral:     Discharge planning Services   [facility] and HHPT [facility]  Post Acute Care Choice:    Choice offered to:   Pt niece, Ms Gerald Sandoval.  DME Arranged:   None DME Agency:   None  HH Arranged:   HHRN and [place] Agency:   [facility]  Status of Service:   Complete  If discussed at [employer], dates discussed:    Additional Comments:  Adron Bene, RN 05/09/2016, 11:10 AM

## 2016-05-09 NOTE — Discharge Summary (Signed)
Physician Discharge Summary  Gerald Sandoval [id number] DOB: 01/22/1924 DOA: 05/05/2016  PCP: Jerlyn Ly, MD  Admit date: 05/05/2016 Discharge date: 05/09/2016  Time spent: 45 minutes  Recommendations for Outpatient Follow-up:  1. PCP Dr.Perrini in 1 week, please consider weaning off Seroquel as tolerated 2. Eliquis stopped due to large Left leg hematoma/wound, please reevaluate in 4-6weeks if Eliquis can be resumed  3. Wound care to Left lower leg   Discharge Diagnoses:  Principal Problem:   Hematoma of leg, left, initial encounter   Fall   Delirium/agitation   Goals of care, counseling/discussion   Persistent atrial fibrillation ([place])   AKI (acute kidney injury) ([place])   Hyperglycemia   Chronic anemia   Acute encephalopathy   Protein-calorie malnutrition, severe   Discharge Condition: stable  Diet recommendation: heart healthy  Filed Weights   05/06/16 0109 05/06/16 1955  Weight: 58.5 kg (128 lb 15.5 oz) 60.3 kg (132 lb 15 oz)    History of present illness:  Pt. with PMH of A. fib on anticoagulation with Apixaban; admitted on 05/05/2016, with complaint of fall, was found to have a large left leg hematoma. Hospital stay complicated by agitation and delirium  Hospital Course:  1. Hematoma, left lower leg - Patient experienced a fall and was brought into the ER - CT pelvis and x-ray tibia-fibula was negative for fracture, sustained a large hematoma and wound to LLE. -We stopped his ELiquis, was seen by ORtho, recommended supportive care only -PT eval completed, family decided against SNF and elected to take him back to his facility with more support -continue wound care   2. Acute encephalopathy/Hospital delirium  -UTI ruled out, urine culture was negative -B12 and TSh-WNL, He was started on low dose Seroquel Qhs by Dr.Patel for agitation and delirium and he improved after this, now mostly back to baseline. -consider slowly weaning off Seroquel as  outpatient  3. Afib on anticoagulation - HR controlled - Will continue Toprol XL at 12.5 given soft blood pressure. - Eliquis on hold due to above - Will need to re-assess risk/benefit of anticoagulation in 4-6weeks  4.  Chronic anemia - Hemoglobin stable   5 . AKI - Creatinine 1.30, was 0.79 on 07/24/15 - improved with hydration to normal range   Consultations:  Ortho Dr.Xu  Discharge Exam: Vitals:   05/09/16 1029 05/09/16 1053  BP: 112/78 110/63  Pulse: (!) 114 (!) 106  Resp: 16   Temp: 98.5 F (36.9 C)     General: AAox2 Cardiovascular: S1S2/RRR Respiratory: CTAB  Discharge Instructions   Discharge Instructions    Diet - low sodium heart healthy    Complete by:  As directed    Increase activity slowly    Complete by:  As directed      Current Discharge Medication List    START taking these medications   Details  QUEtiapine (SEROQUEL) 25 MG tablet Take 1 tablet (25 mg total) by mouth at bedtime. Qty: 15 tablet, Refills: 0      CONTINUE these medications which have NOT CHANGED   Details  cetirizine (ZYRTEC) 10 MG tablet Take 10 mg by mouth daily.    Cyanocobalamin (VITAMIN B-12) 2500 MCG SUBL Place 2,500 mcg under the tongue daily.     ferrous sulfate (IRON SUPPLEMENT) 325 (65 FE) MG tablet Take 325 mg by mouth daily with breakfast.      finasteride (PROSCAR) 5 MG tablet Take 5 mg by mouth daily.      metoprolol (TOPROL-XL) 50  MG 24 hr tablet Take 1.5 tablets (75 mg total) by mouth daily. Qty: 30 tablet, Refills: 8    mirtazapine (REMERON) 15 MG tablet Take 15 mg by mouth at bedtime.    senna-docusate (SENOKOT-S) 8.6-50 MG tablet Take 1 tablet by mouth at bedtime.    tamsulosin (FLOMAX) 0.4 MG CAPS capsule Take 0.4 mg by mouth daily.     HYDROcodone-acetaminophen (NORCO) 7.5-325 MG tablet Take 1-2 tablets by mouth every 6 (six) hours as needed for moderate pain. Qty: 90 tablet, Refills: 0      STOP taking these medications     apixaban  (ELIQUIS) 2.5 MG TABS tablet      sulfamethoxazole-trimethoprim (BACTRIM DS) 800-160 MG per tablet        Allergies  Allergen Reactions  . Primidone    Follow-up Information    PERINI,MARK A, MD. Schedule an appointment as soon as possible for a visit in 1 week(s).   Specialty:  Internal Medicine Contact information: [place] [address] [phone]            The results of significant diagnostics from this hospitalization (including imaging, microbiology, ancillary and laboratory) are listed below for reference.    Significant Diagnostic Studies: Dg Tibia/fibula Left  Result Date: 05/05/2016 CLINICAL DATA:  Pain after fall with hematoma noted laterally. EXAM: LEFT TIBIA AND FIBULA - 2 VIEW COMPARISON:  None. FINDINGS: No acute displaced fracture or bone destruction. Popliteal and tibial arteriosclerosis. The knee and ankle joints are maintained. Small ossicles are noted off the tip of the fibula consistent with old remote trauma. More focal calf soft tissue swelling noted posterolaterally the left leg consistent with reported hematoma. IMPRESSION: Soft tissue swelling of the posterolateral leg without acute osseous abnormality. Electronically Signed   By: Ashley Royalty M.D.   On: 05/05/2016 20:00   Ct Head Wo Contrast  Result Date: 05/05/2016 CLINICAL DATA:  Headache and neck pain after fall today. No loss of consciousness. No EXAM: CT HEAD WITHOUT CONTRAST CT CERVICAL SPINE WITHOUT CONTRAST TECHNIQUE: Multidetector CT imaging of the head and cervical spine was performed following the standard protocol without intravenous contrast. Multiplanar CT image reconstructions of the cervical spine were also generated. COMPARISON:  None. FINDINGS: CT HEAD FINDINGS BRAIN: There is sulcal and ventricular prominence consistent with superficial and central atrophy. No intraparenchymal hemorrhage, mass effect nor midline shift. There is a chronic stable moderate degree of  patchy periventricular and subcortical white matter hypodensity consistent with chronic small vessel ischemic disease. No acute large vascular territory infarcts. No abnormal extra-axial fluid collections. Basal cisterns are patent. VASCULAR: Moderate calcific atherosclerosis of the carotid siphons. SKULL: No skull fracture. No significant scalp soft tissue swelling. The walls of the left maxillary sinus are somewhat thickened relative to right likely related to change from chronic sinusitis. There bold SINUSES/ORBITS: The mastoid air-cells are clear. The included paranasal sinuses are well-aerated.The included ocular globes and orbital contents are non-suspicious. OTHER: None. CT CERVICAL SPINE FINDINGS Alignment: Normal alignment of the cervical spine. Skull base and vertebrae: There are anterior wedge compression fractures of T1 and T3 possibly be with T3 as is not apparent on the lateral chest radiograph from 06/22/2007 and also incompletely included on the prior cervical spine CT from 06/25/2015. No retropulsion is identified. Soft tissues and spinal canal: No spinal stenosis nor intra canal hemorrhage. Disc levels: Chronic mild disc space narrowing at C5-6 with small posterior marginal osteophytes. Facet joints are maintained without malalignment. There is mild multilevel  degenerative facet arthropathy more so on the right from C3 through C6 and on the left at C2-3. The atlantodental interval is maintained. Upper chest: Negative for acute disease Other: Atherosclerosis of the visualized aortic arch. IMPRESSION: Chronic small vessel ischemic disease of periventricular and subcortical white matter. No acute intracranial abnormality. There is mild anterior wedge compression deformity of the T3 vertebral body which appears new relative to the prior CT from 06/25/2015. It is however incompletely included on the prior exam. No retropulsion nor intraspinal hematoma is seen. Clinical correlation would be helpful over  the T3 level. Non emergent MRI or bone scan may also help for further evaluation. Stable mild compression of T1. No acute cervical spinal fracture. Degenerative disc disease at C5-6. Electronically Signed   By: Ashley Royalty M.D.   On: 05/05/2016 20:20   Ct Cervical Spine Wo Contrast  Result Date: 05/05/2016 CLINICAL DATA:  Headache and neck pain after fall today. No loss of consciousness. No EXAM: CT HEAD WITHOUT CONTRAST CT CERVICAL SPINE WITHOUT CONTRAST TECHNIQUE: Multidetector CT imaging of the head and cervical spine was performed following the standard protocol without intravenous contrast. Multiplanar CT image reconstructions of the cervical spine were also generated. COMPARISON:  None. FINDINGS: CT HEAD FINDINGS BRAIN: There is sulcal and ventricular prominence consistent with superficial and central atrophy. No intraparenchymal hemorrhage, mass effect nor midline shift. There is a chronic stable moderate degree of patchy periventricular and subcortical white matter hypodensity consistent with chronic small vessel ischemic disease. No acute large vascular territory infarcts. No abnormal extra-axial fluid collections. Basal cisterns are patent. VASCULAR: Moderate calcific atherosclerosis of the carotid siphons. SKULL: No skull fracture. No significant scalp soft tissue swelling. The walls of the left maxillary sinus are somewhat thickened relative to right likely related to change from chronic sinusitis. There bold SINUSES/ORBITS: The mastoid air-cells are clear. The included paranasal sinuses are well-aerated.The included ocular globes and orbital contents are non-suspicious. OTHER: None. CT CERVICAL SPINE FINDINGS Alignment: Normal alignment of the cervical spine. Skull base and vertebrae: There are anterior wedge compression fractures of T1 and T3 possibly be with T3 as is not apparent on the lateral chest radiograph from 06/22/2007 and also incompletely included on the prior cervical spine CT from  06/25/2015. No retropulsion is identified. Soft tissues and spinal canal: No spinal stenosis nor intra canal hemorrhage. Disc levels: Chronic mild disc space narrowing at C5-6 with small posterior marginal osteophytes. Facet joints are maintained without malalignment. There is mild multilevel degenerative facet arthropathy more so on the right from C3 through C6 and on the left at C2-3. The atlantodental interval is maintained. Upper chest: Negative for acute disease Other: Atherosclerosis of the visualized aortic arch. IMPRESSION: Chronic small vessel ischemic disease of periventricular and subcortical white matter. No acute intracranial abnormality. There is mild anterior wedge compression deformity of the T3 vertebral body which appears new relative to the prior CT from 06/25/2015. It is however incompletely included on the prior exam. No retropulsion nor intraspinal hematoma is seen. Clinical correlation would be helpful over the T3 level. Non emergent MRI or bone scan may also help for further evaluation. Stable mild compression of T1. No acute cervical spinal fracture. Degenerative disc disease at C5-6. Electronically Signed   By: Ashley Royalty M.D.   On: 05/05/2016 20:20   Ct Pelvis Wo Contrast  Result Date: 05/05/2016 CLINICAL DATA:  Bilateral hip pain after a fall today, greater on the right. Prior arthroplasties. EXAM: CT PELVIS WITHOUT CONTRAST TECHNIQUE: Multidetector  CT imaging of the pelvis was performed following the standard protocol without intravenous contrast. COMPARISON:  Hip radiographs earlier today. CT abdomen and pelvis 06/26/2015. FINDINGS: Urinary Tract: Partially visualized right lower pole renal cyst. Bladder is largely obscured by streak artifact. Bowel: Left-sided colonic diverticulosis without evidence of diverticulitis. Vascular/Lymphatic: Extensive, diffuse atherosclerotic vascular calcification. No enlarged lymph nodes. Reproductive:  Prostate obscured by artifact. Other: No  intraperitoneal free fluid identified within limitations of artifact. No lower abdominal wall mass or hernia. Musculoskeletal: Bilateral total hip arthroplasties are again seen with associated streak artifact. There is also lateral plate and screw and cerclage wire fixation of an old periprosthetic right femur fracture at the level of the femoral stem. No acute fracture is identified within limitations of streak artifact. IMPRESSION: Bilateral total hip arthroplasties and old healed right periprosthetic femur fracture. No acute fracture identified. Electronically Signed   By: Logan Bores M.D.   On: 05/05/2016 21:40   Dg Hips Bilat W Or Wo Pelvis 3-4 Views  Result Date: 05/05/2016 CLINICAL DATA:  Bilateral hip pain after fall left worse than right with large hematoma lateral to the tibia and fibula. EXAM: DG HIP (WITH OR WITHOUT PELVIS) 3-4V BILAT COMPARISON:  06/25/2015 pelvic and right hip radiographs. CT 06/25/2015 of the right femur. FINDINGS: Bilateral hip arthroplasties with cerclage wire fixation are noted bilaterally. Plate and screw fixation of the proximal right femur is noted since prior exam extending approximately 7.4 cm beyond the tip of the prosthetic femoral stem. Calcified atherosclerosis of the external iliac through femoral arteries bilaterally. Heterotopic new bone formation is seen bilaterally more so on the left. IMPRESSION: No acute osseous abnormality. Bilateral hip arthroplasties supported by by plate and screw fixation on the right and cerclage wires bilaterally. Electronically Signed   By: Ashley Royalty M.D.   On: 05/05/2016 19:57    Microbiology: Recent Results (from the past 240 hour(s))  MRSA PCR Screening     Status: Abnormal   Collection Time: 05/06/16  6:55 AM  Result Value Ref Range Status   MRSA by PCR POSITIVE (A) NEGATIVE Final    Comment:        The GeneXpert MRSA Assay (FDA approved for NASAL specimens only), is one component of a comprehensive MRSA  colonization surveillance program. It is not intended to diagnose MRSA infection nor to guide or monitor treatment for MRSA infections. RESULT CALLED TO, READ BACK BY AND VERIFIED WITH: [facility] RN AT 0900 05/06/16 BY A.DAVIS   Urine culture     Status: Abnormal   Collection Time: 05/06/16  5:20 PM  Result Value Ref Range Status   Specimen Description URINE, RANDOM  Final   Special Requests NONE  Final   Culture MULTIPLE SPECIES PRESENT, SUGGEST RECOLLECTION (A)  Final   Report Status 05/07/2016 FINAL  Final     Labs: Basic Metabolic Panel:  Recent Labs Lab 05/05/16 1645 05/06/16 0059 05/07/16 0539 05/08/16 1000 05/09/16 0355  NA 135 135 136 140 139  K 4.6 5.0 3.3* 3.6 4.1  CL 101 105 102 109 106  CO2 21* 20* 22 24 25   GLUCOSE 174* 177* 140* 153* 117*  BUN 10 13 15 13 19   CREATININE 1.30* 1.19 1.30* 0.88 0.79  CALCIUM 8.8* 8.3* 8.6* 8.2* 8.4*  MG  --   --  1.8 1.7  --    Liver Function Tests:  Recent Labs Lab 05/07/16 0539 05/08/16 1000  AST 48* 43*  ALT 28 25  ALKPHOS 50 40  BILITOT  1.4* 1.4*  PROT 6.0* 5.2*  ALBUMIN 3.7 3.0*   No results for input(s): LIPASE, AMYLASE in the last 168 hours.  Recent Labs Lab 05/07/16 1629  AMMONIA 20   CBC:  Recent Labs Lab 05/05/16 1645 05/06/16 0059 05/06/16 0959 05/07/16 0539 05/08/16 1000 05/09/16 0355  WBC 15.1* 12.4* 10.8* 13.7* 10.5 9.2  NEUTROABS 13.7*  --  9.0* 11.8* 9.0*  --   HGB 12.1* 11.2* 10.0* 10.8* 9.3* 9.7*  HCT 35.4* 32.0* 28.3* 31.4* 27.0* 28.3*  MCV 97.8 97.6 97.3 99.1 98.5 98.6  PLT 121* 87* 106* 103* 86* 109*   Cardiac Enzymes:  Recent Labs Lab 05/07/16 0539  CKTOTAL 325   BNP: BNP (last 3 results) No results for input(s): BNP in the last 8760 hours.  ProBNP (last 3 results) No results for input(s): PROBNP in the last 8760 hours.  CBG:  Recent Labs Lab 05/06/16 0413 05/06/16 0741 05/06/16 1211 05/06/16 1714 05/07/16 1215  GLUCAP 160* 131* 113* 154* 148*        SignedDomenic Polite MD.  [facility] 05/09/2016, 11:22 AM

## 2016-05-09 NOTE — Progress Notes (Signed)
Patient was discharged to home, [facility]. Discharge instructions and AVS reviewed with patient and daughter. Patients IV was discontinued, prescriptions were provided and family was working on scheduling follow up appointments. Patient left the floor via wheelchair with family and tech.

## 2016-05-10 DIAGNOSIS — J439 Emphysema, unspecified: Secondary | ICD-10-CM | POA: Diagnosis not present

## 2016-05-10 DIAGNOSIS — I482 Chronic atrial fibrillation: Secondary | ICD-10-CM | POA: Diagnosis not present

## 2016-05-10 DIAGNOSIS — S8012XD Contusion of left lower leg, subsequent encounter: Secondary | ICD-10-CM | POA: Diagnosis not present

## 2016-05-10 DIAGNOSIS — G9340 Encephalopathy, unspecified: Secondary | ICD-10-CM | POA: Diagnosis not present

## 2016-05-10 DIAGNOSIS — C4A9 Merkel cell carcinoma, unspecified: Secondary | ICD-10-CM | POA: Diagnosis not present

## 2016-05-10 DIAGNOSIS — S81812D Laceration without foreign body, left lower leg, subsequent encounter: Secondary | ICD-10-CM | POA: Diagnosis not present

## 2016-05-17 DIAGNOSIS — M6281 Muscle weakness (generalized): Secondary | ICD-10-CM | POA: Diagnosis not present

## 2016-05-17 DIAGNOSIS — Z96641 Presence of right artificial hip joint: Secondary | ICD-10-CM | POA: Diagnosis not present

## 2016-05-17 DIAGNOSIS — Z4789 Encounter for other orthopedic aftercare: Secondary | ICD-10-CM | POA: Diagnosis not present

## 2016-05-17 DIAGNOSIS — R609 Edema, unspecified: Secondary | ICD-10-CM | POA: Diagnosis not present

## 2016-05-17 DIAGNOSIS — Z5181 Encounter for therapeutic drug level monitoring: Secondary | ICD-10-CM | POA: Diagnosis not present

## 2016-05-17 DIAGNOSIS — Z96661 Presence of right artificial ankle joint: Secondary | ICD-10-CM | POA: Diagnosis not present

## 2016-05-17 NOTE — Care Management (Signed)
05/17/2016 Received a call from Mr Marceaux's niece in [place], [place] Call[place], sister of Ms Doren Custard who had previously handling this pt d/c needs. Per Ms Ronnald Ramp the pt is doing poorly at his independently apartment and needs SNF. This CM advised Ms Ronnald Ramp that as the pt is no longer in the hospital we are unable to assist with needs , however his [facility] agency could send a Education officer, museum to work with the pt and his MD to arrange SNF placement.  Also placed a call to [facility], who states that they will be able to assist.  Called Ms Ronnald Ramp back and gave her the number for Tim from [facility] to discuss pt needs.

## 2016-05-19 ENCOUNTER — Encounter (HOSPITAL_COMMUNITY): Payer: Self-pay

## 2016-05-19 ENCOUNTER — Emergency Department (HOSPITAL_COMMUNITY)
Admission: EM | Admit: 2016-05-19 | Discharge: 2016-05-19 | Disposition: A | Discharge status: Home / Self Care | Payer: Medicare Other | Attending: Emergency Medicine | Admitting: Emergency Medicine

## 2016-05-19 ENCOUNTER — Emergency Department (HOSPITAL_COMMUNITY): Payer: Medicare Other

## 2016-05-19 DIAGNOSIS — M7981 Nontraumatic hematoma of soft tissue: Secondary | ICD-10-CM | POA: Insufficient documentation

## 2016-05-19 DIAGNOSIS — Z4801 Encounter for change or removal of surgical wound dressing: Secondary | ICD-10-CM | POA: Insufficient documentation

## 2016-05-19 DIAGNOSIS — Z96643 Presence of artificial hip joint, bilateral: Secondary | ICD-10-CM | POA: Diagnosis not present

## 2016-05-19 DIAGNOSIS — T148XXA Other injury of unspecified body region, initial encounter: Secondary | ICD-10-CM

## 2016-05-19 DIAGNOSIS — M79606 Pain in leg, unspecified: Secondary | ICD-10-CM | POA: Diagnosis not present

## 2016-05-19 DIAGNOSIS — S8992XA Unspecified injury of left lower leg, initial encounter: Secondary | ICD-10-CM | POA: Diagnosis not present

## 2016-05-19 DIAGNOSIS — I621 Nontraumatic extradural hemorrhage: Secondary | ICD-10-CM | POA: Diagnosis not present

## 2016-05-19 DIAGNOSIS — Z5189 Encounter for other specified aftercare: Secondary | ICD-10-CM

## 2016-05-19 DIAGNOSIS — Z79899 Other long term (current) drug therapy: Secondary | ICD-10-CM | POA: Insufficient documentation

## 2016-05-19 DIAGNOSIS — R031 Nonspecific low blood-pressure reading: Secondary | ICD-10-CM | POA: Diagnosis not present

## 2016-05-19 DIAGNOSIS — M7989 Other specified soft tissue disorders: Secondary | ICD-10-CM | POA: Diagnosis not present

## 2016-05-19 DIAGNOSIS — S8012XA Contusion of left lower leg, initial encounter: Secondary | ICD-10-CM | POA: Diagnosis not present

## 2016-05-19 LAB — CBC WITH DIFFERENTIAL/PLATELET
Basophils Absolute: 0 10*3/uL (ref 0.0–0.1)
Basophils Relative: 0 %
Eosinophils Absolute: 0.2 10*3/uL (ref 0.0–0.7)
Eosinophils Relative: 3 %
HCT: 28.4 % — ABNORMAL LOW (ref 39.0–52.0)
Hemoglobin: 10.1 g/dL — ABNORMAL LOW (ref 13.0–17.0)
Lymphocytes Relative: 8 %
Lymphs Abs: 0.6 10*3/uL — ABNORMAL LOW (ref 0.7–4.0)
MCH: 34.8 pg — ABNORMAL HIGH (ref 26.0–34.0)
MCHC: 35.6 g/dL (ref 30.0–36.0)
MCV: 97.9 fL (ref 78.0–100.0)
Monocytes Absolute: 0.7 10*3/uL (ref 0.1–1.0)
Monocytes Relative: 9 %
Neutro Abs: 6.1 10*3/uL (ref 1.7–7.7)
Neutrophils Relative %: 80 %
Platelets: 264 10*3/uL (ref 150–400)
RBC: 2.9 MIL/uL — ABNORMAL LOW (ref 4.22–5.81)
RDW: 14.9 % (ref 11.5–15.5)
WBC: 7.6 10*3/uL (ref 4.0–10.5)

## 2016-05-19 LAB — BASIC METABOLIC PANEL
Anion gap: 7 (ref 5–15)
BUN: 37 mg/dL — ABNORMAL HIGH (ref 6–20)
CO2: 25 mmol/L (ref 22–32)
Calcium: 8.6 mg/dL — ABNORMAL LOW (ref 8.9–10.3)
Chloride: 100 mmol/L — ABNORMAL LOW (ref 101–111)
Creatinine, Ser: 1.01 mg/dL (ref 0.61–1.24)
GFR calc Af Amer: >60 mL/min (ref >60)
GFR calc non Af Amer: >60 mL/min (ref >60)
Glucose, Bld: 114 mg/dL — ABNORMAL HIGH (ref 65–99)
Potassium: 4.9 mmol/L (ref 3.5–5.1)
Sodium: 132 mmol/L — ABNORMAL LOW (ref 135–145)

## 2016-05-19 MED ORDER — CEPHALEXIN 500 MG PO CAPS
500.0000 mg | ORAL_CAPSULE | Freq: Once | ORAL | Status: AC
  Administered 2016-05-19: 500 mg via ORAL
  Filled 2016-05-19: qty 1

## 2016-05-19 MED ORDER — CEPHALEXIN 500 MG PO CAPS: 500.0000 mg | ORAL_CAPSULE | Freq: Four times a day (QID) | ORAL | 0 refills | Status: DC

## 2016-05-19 NOTE — ED Notes (Signed)
Non-Emergent transportation services called to transport patient back to [facility]. Spoke with his caregiver Mrs. Whitt and informed her that he would be returning to the facility also.

## 2016-05-19 NOTE — ED Notes (Signed)
Clent Ridges is his caretaker [phone].

## 2016-05-19 NOTE — ED Triage Notes (Signed)
Left lower leg hematoma being treated but now bleeding again sent here from [facility] for evaluation and treatment.

## 2016-05-19 NOTE — ED Provider Notes (Signed)
[facility] DEPT Provider Note   CSN: [id number] Arrival date & time: 05/19/16  0109     History   Chief Complaint Chief Complaint  Patient presents with  . Wound Check    hematoma bleeding    HPI Gerald Sandoval is a 82 y.o. male.  HPI   82 year old male presents today for wound check. Patient is a resident of [facility]. Patient suffered a fall on 05/05/2016 a large hematoma to his left lower extremity. He was brought to the hospital, CT pelvis and plain films were obtained which showed no fractures. Anticoagulation was stopped, orthopedics consult patient and recommended supportive care. Family decided against SNF placement and wanted him to go back to [facility].  Per the nursing staff they report the wound has become worse and was bleeding today. They deny any reported fevers.   Past Medical History:  Diagnosis Date  . Abscess of left hip    infection left hip  . Anemia   . BPH (benign prostatic hyperplasia)   . Colon polyp   . Diastolic dysfunction   . Dysphagia    unspecified  . Emphysema   . Gastric ulcer   . Gastritis   . Hernia    Rt. Inguinial  . History of colonic polyps   . Hypogonadism in male   . Kidney stones   . Osteoarthritis   . Osteoporosis   . Permanent atrial fibrillation ([place])   . RBBB (right bundle branch block)   . Rotator cuff injury     Patient Active Problem List   Diagnosis Date Noted  . Protein-calorie malnutrition, severe 05/08/2016  . Acute encephalopathy 05/07/2016  . Hematoma of leg, left, initial encounter 05/06/2016  . Hyperglycemia 05/06/2016  . Chronic anemia 05/06/2016  . AKI (acute kidney injury) ([place]) 05/05/2016  . Persistent atrial fibrillation ([place])   . Chronic anticoagulation-Pradaxa prior to admission 06/28/2015  . RBBB 06/28/2015  . DVT of Rt lower limb, acute ([place]) 06/28/2015  . S/P IVC filter 06/27/15 06/28/2015  . S/P Lt hip replacement, chronically infected [facility]) 06/28/2015  . Lactic  acidosis   . Encounter for palliative care   . Goals of care, counseling/discussion   . Fracture - Rt hip prosthesis this adm   . Severe sepsis- found down at nursing home 06/25/2015  . Sepsis ([place]) 06/25/2015  . Edema of right lower extremity 06/25/2015  . History of right hip replacement 06/25/2015  . Hyperkalemia 06/25/2015  . Metabolic acidosis [id number]  . EMPHYSEMA 08/03/2009  . OSTEOARTHRITIS 08/03/2009  . ARTHRITIS 08/03/2009  . GASTRIC ULCER 06/02/2009  . GASTRITIS 06/02/2009  . COLONIC POLYPS 05/03/2009  . DYSPHAGIA UNSPECIFIED 05/03/2009    Past Surgical History:  Procedure Laterality Date  . INGUINAL HERNIA REPAIR     RT  . ORIF PERIPROSTHETIC FRACTURE Right 06/29/2015   Procedure: OPEN REDUCTION INTERNAL FIXATION (ORIF) RIGHT HIP PERIPROSTHETIC FRACTURE;  Surgeon: Leandrew Koyanagi, MD;  Location: [facility];  Service: Orthopedics;  Laterality: Right;  . Resection of Ribs    . TOTAL HIP ARTHROPLASTY     Both hips; Left in 2004, Right in 2007  . TRANSURETHRAL RESECTION OF PROSTATE         Home Medications    Prior to Admission medications   Medication Sig Start Date End Date Taking? Authorizing Provider  cephALEXin (KEFLEX) 500 MG capsule Take 1 capsule (500 mg total) by mouth 4 (four) times daily. 05/19/16   Okey Regal, PA-C  cetirizine (ZYRTEC) 10 MG tablet  Take 10 mg by mouth daily.    Historical Provider, MD  Cyanocobalamin (VITAMIN B-12) 2500 MCG SUBL Place 2,500 mcg under the tongue daily.     Historical Provider, MD  ferrous sulfate (IRON SUPPLEMENT) 325 (65 FE) MG tablet Take 325 mg by mouth daily with breakfast.      Historical Provider, MD  finasteride (PROSCAR) 5 MG tablet Take 5 mg by mouth daily.      Historical Provider, MD  HYDROcodone-acetaminophen (NORCO) 7.5-325 MG tablet Take 1-2 tablets by mouth every 6 (six) hours as needed for moderate pain. Patient not taking: Reported on 05/06/2016 06/29/15   Leandrew Koyanagi, MD  metoprolol (TOPROL-XL) 50 MG 24 hr  tablet Take 1.5 tablets (75 mg total) by mouth daily. 04/15/11   Thompson Grayer, MD  mirtazapine (REMERON) 15 MG tablet Take 15 mg by mouth at bedtime.    Historical Provider, MD  QUEtiapine (SEROQUEL) 25 MG tablet Take 1 tablet (25 mg total) by mouth at bedtime. 05/09/16   Domenic Polite, MD  senna-docusate (SENOKOT-S) 8.6-50 MG tablet Take 1 tablet by mouth at bedtime. Patient taking differently: Take 1 tablet by mouth daily.  07/03/15   Thurnell Lose, MD  tamsulosin (FLOMAX) 0.4 MG CAPS capsule Take 0.4 mg by mouth daily.     Historical Provider, MD    Family History Family History  Problem Relation Age of Onset  . Colon cancer Maternal Uncle   . Diabetes Maternal Aunt     Social History Social History  Substance Use Topics  . Smoking status: Never Smoker  . Smokeless tobacco: Never Used  . Alcohol use No     Allergies   Primidone   Review of Systems Review of Systems  All other systems reviewed and are negative.    Physical Exam Updated Vital Signs BP 118/72 (BP Location: Right Arm)   Pulse 65   Temp 97.8 F (36.6 C) (Oral)   Resp 20   Ht 5\' 9"  (1.753 m)   Wt 59.9 kg   SpO2 99%   BMI 19.49 kg/m   Physical Exam  Constitutional: He is oriented to person, place, and time. He appears well-developed and well-nourished.  HENT:  Head: Normocephalic and atraumatic.  Eyes: Conjunctivae are normal. Pupils are equal, round, and reactive to light. Right eye exhibits no discharge. Left eye exhibits no discharge. No scleral icterus.  Neck: Normal range of motion. No JVD present. No tracheal deviation present.  Pulmonary/Chest: Effort normal. No stridor.  Musculoskeletal:  Hematoma to the left lower extremity. No active bleeding. Chronic surrounding changes and bruising, very minor warmth to touch  Neurological: He is alert and oriented to person, place, and time. Coordination normal.  Psychiatric: He has a normal mood and affect. His behavior is normal. Judgment and  thought content normal.  Nursing note and vitals reviewed.   ED Treatments / Results  Labs (all labs ordered are listed, but only abnormal results are displayed) Labs Reviewed  CBC WITH DIFFERENTIAL/PLATELET - Abnormal; Notable for the following:       Result Value   RBC 2.90 (*)    Hemoglobin 10.1 (*)    HCT 28.4 (*)    MCH 34.8 (*)    Lymphs Abs 0.6 (*)    All other components within normal limits  BASIC METABOLIC PANEL - Abnormal; Notable for the following:    Sodium 132 (*)    Chloride 100 (*)    Glucose, Bld 114 (*)    BUN 37 (*)  Calcium 8.6 (*)    All other components within normal limits    EKG  EKG Interpretation None       Radiology Dg Tibia/fibula Left  Result Date: 05/19/2016 CLINICAL DATA:  Infection and bleeding of mid leg EXAM: LEFT TIBIA AND FIBULA - 2 VIEW COMPARISON:  Tibia/ fibula radiograph 05/05/2016. FINDINGS: There is no evidence of fracture or other focal bone lesions. Vascular calcifications are noted. There is persistent soft tissue swelling of the lateral aspect of the left leg. IMPRESSION: Persistent soft tissue swelling of the lateral aspect of the left leg without focal osseous abnormality. Electronically Signed   By: Ulyses Jarred M.D.   On: 05/19/2016 03:45    Procedures Procedures (including critical care time)  Medications Ordered in ED Medications  cephALEXin (KEFLEX) capsule 500 mg (500 mg Oral Given 05/19/16 0524)     Initial Impression / Assessment and Plan / ED Course  I have reviewed the triage vital signs and the nursing notes.  Pertinent labs & imaging results that were available during my care of the patient were reviewed by me and considered in my medical decision making (see chart for details).  Clinical Course      Final Clinical Impressions(s) / ED Diagnoses   Final diagnoses:  Visit for wound check  Hematoma    Labs:  Imaging:  Consults:  Therapeutics:  Discharge Meds:   Assessment/Plan:   21-year-old male presents today for wound check. He has a hematoma to the left lower extremity, surrounding bruising and chronic changes. Patient has questionable surrounding infection, this does not appear to be severe, he has no active bleeding while here in the ED. Patient will be started on antibiotics, will need follow-up with wound care. He has a reassuring laboratory analysis, reassuring vital signs.     New Prescriptions Discharge Medication List as of 05/19/2016  6:35 AM    START taking these medications   Details  cephALEXin (KEFLEX) 500 MG capsule Take 1 capsule (500 mg total) by mouth 4 (four) times daily., Starting Sun 05/19/2016, Print         Okey Regal, PA-C 05/19/16 [id number]    Quintella Reichert, MD 05/22/16 [phone]

## 2016-05-19 NOTE — Discharge Instructions (Signed)
Patient will need to continue wound care with the addition of antibiotics. He will need primary care follow-up next week for reevaluation. Please return immediately if any new or worsening signs or symptoms present.

## 2016-05-22 ENCOUNTER — Emergency Department (HOSPITAL_COMMUNITY)
Admission: EM | Admit: 2016-05-22 | Discharge: 2016-05-23 | Disposition: A | Discharge status: Home / Self Care | Payer: Medicare Other | Attending: Emergency Medicine | Admitting: Emergency Medicine

## 2016-05-22 ENCOUNTER — Emergency Department (HOSPITAL_COMMUNITY): Payer: Medicare Other

## 2016-05-22 ENCOUNTER — Encounter (HOSPITAL_COMMUNITY): Payer: Self-pay | Admitting: Emergency Medicine

## 2016-05-22 DIAGNOSIS — W1830XD Fall on same level, unspecified, subsequent encounter: Secondary | ICD-10-CM | POA: Diagnosis not present

## 2016-05-22 DIAGNOSIS — J9811 Atelectasis: Secondary | ICD-10-CM | POA: Diagnosis not present

## 2016-05-22 DIAGNOSIS — D6489 Other specified anemias: Secondary | ICD-10-CM | POA: Diagnosis not present

## 2016-05-22 DIAGNOSIS — Z96643 Presence of artificial hip joint, bilateral: Secondary | ICD-10-CM | POA: Diagnosis not present

## 2016-05-22 DIAGNOSIS — S81802A Unspecified open wound, left lower leg, initial encounter: Secondary | ICD-10-CM | POA: Diagnosis not present

## 2016-05-22 DIAGNOSIS — S8012XD Contusion of left lower leg, subsequent encounter: Secondary | ICD-10-CM

## 2016-05-22 DIAGNOSIS — L97922 Non-pressure chronic ulcer of unspecified part of left lower leg with fat layer exposed: Secondary | ICD-10-CM | POA: Diagnosis not present

## 2016-05-22 DIAGNOSIS — I480 Paroxysmal atrial fibrillation: Secondary | ICD-10-CM | POA: Diagnosis not present

## 2016-05-22 DIAGNOSIS — Z79899 Other long term (current) drug therapy: Secondary | ICD-10-CM | POA: Insufficient documentation

## 2016-05-22 DIAGNOSIS — S80812A Abrasion, left lower leg, initial encounter: Secondary | ICD-10-CM | POA: Diagnosis not present

## 2016-05-22 DIAGNOSIS — Z6821 Body mass index (BMI) 21.0-21.9, adult: Secondary | ICD-10-CM | POA: Diagnosis not present

## 2016-05-22 LAB — CBC WITH DIFFERENTIAL/PLATELET
BASOS PCT: 0 %
Basophils Absolute: 0 10*3/uL (ref 0.0–0.1)
EOS ABS: 0.1 10*3/uL (ref 0.0–0.7)
EOS PCT: 1 %
HCT: 28.5 % — ABNORMAL LOW (ref 39.0–52.0)
Hemoglobin: 9.8 g/dL — ABNORMAL LOW (ref 13.0–17.0)
Lymphocytes Relative: 7 %
Lymphs Abs: 0.6 10*3/uL — ABNORMAL LOW (ref 0.7–4.0)
MCH: 33.6 pg (ref 26.0–34.0)
MCHC: 34.4 g/dL (ref 30.0–36.0)
MCV: 97.6 fL (ref 78.0–100.0)
Monocytes Absolute: 1 10*3/uL (ref 0.1–1.0)
Monocytes Relative: 12 %
NEUTROS PCT: 80 %
Neutro Abs: 7 10*3/uL (ref 1.7–7.7)
PLATELETS: 262 10*3/uL (ref 150–400)
RBC: 2.92 MIL/uL — AB (ref 4.22–5.81)
RDW: 15 % (ref 11.5–15.5)
WBC: 8.7 10*3/uL (ref 4.0–10.5)

## 2016-05-22 LAB — BASIC METABOLIC PANEL
Anion gap: 9 (ref 5–15)
BUN: 26 mg/dL — AB (ref 6–20)
CALCIUM: 8.2 mg/dL — AB (ref 8.9–10.3)
CO2: 23 mmol/L (ref 22–32)
CREATININE: 1.13 mg/dL (ref 0.61–1.24)
Chloride: 100 mmol/L — ABNORMAL LOW (ref 101–111)
GFR, EST NON AFRICAN AMERICAN: 55 mL/min — AB (ref >60)
Glucose, Bld: 123 mg/dL — ABNORMAL HIGH (ref 65–99)
Potassium: 5 mmol/L (ref 3.5–5.1)
SODIUM: 132 mmol/L — AB (ref 135–145)

## 2016-05-22 MED ORDER — SODIUM CHLORIDE 0.9 % IV BOLUS (SEPSIS)
1000.0000 mL | Freq: Once | INTRAVENOUS | Status: AC
  Administered 2016-05-22: 1000 mL via INTRAVENOUS

## 2016-05-22 NOTE — Progress Notes (Signed)
CSW spoke to the admissions coordinator at [facility] who is aware of pt and is willing to accept him pending paperwork and [facility] authorization.  FL2 has been prepared and faxed to [facility].  [place] has been filed and number has been received.  Daytime ED CSW to file for insurance authorization and facilitate tx to facility.  Family (niece/husband) will contact facility re: completion of admission paperwork and transport pt to facility.  CSW will continue to follow for disposition.

## 2016-05-22 NOTE — ED Notes (Signed)
Alert to BP change; pt to go to next room

## 2016-05-22 NOTE — Consult Note (Signed)
Reason for Consult:Left lateral leg hematoma Referring Physician: Dr. Rex Kras Consulting Physician:Bruno E Nitka  Orthopedic Diagnosis:Left subcutaneous hematoma with overlying dermal necrosis and draining serohematoma.  Gerald Sandoval is an 82 y.o. male.History of falls, last year fell sustaining a periprosthetic hip fracture treated by Dr. Erlinda Hong with ORIF and eventually healled. Apparently he fell 12/10 was admitted with bilateral hip pain and a left leg hematoma. Noted by medicine to have been discussed with Dr. Erlinda Hong concerning the left leg hematoma and he recommended conservative supportive care. Has been seen by ER for wound checks and today saw [facility] and was then referred to the Clinic to be seen, unfortunately the clinic is not open today and he was referred to the ER for evaluation. I am on call for Dr. Erlinda Hong and an here to see and treat him.    Past Medical History:  Diagnosis Date  . Abscess of left hip    infection left hip  . Anemia   . BPH (benign prostatic hyperplasia)   . Colon polyp   . Diastolic dysfunction   . Dysphagia    unspecified  . Emphysema   . Gastric ulcer   . Gastritis   . Hernia    Rt. Inguinial  . History of colonic polyps   . Hypogonadism in male   . Kidney stones   . Osteoarthritis   . Osteoporosis   . Permanent atrial fibrillation ([place])   . RBBB (right bundle branch block)   . Rotator cuff injury     Past Surgical History:  Procedure Laterality Date  . INGUINAL HERNIA REPAIR     RT  . ORIF PERIPROSTHETIC FRACTURE Right 06/29/2015   Procedure: OPEN REDUCTION INTERNAL FIXATION (ORIF) RIGHT HIP PERIPROSTHETIC FRACTURE;  Surgeon: Leandrew Koyanagi, MD;  Location: [facility];  Service: Orthopedics;  Laterality: Right;  . Resection of Ribs    . TOTAL HIP ARTHROPLASTY     Both hips; Left in 2004, Right in 2007  . TRANSURETHRAL RESECTION OF PROSTATE      Family History  Problem Relation Age of Onset  . Colon cancer Maternal Uncle   .  Diabetes Maternal Aunt     Social History:  reports that he has never smoked. He has never used smokeless tobacco. He reports that he does not drink alcohol or use drugs.  Allergies:  Allergies  Allergen Reactions  . Hydrocodone-Acetaminophen Other (See Comments)    DELIRIUM, AGITATION, HYPERACTIVITY  . Seroquel [Quetiapine] Other (See Comments)    DELIRIUM, AGITATION, HYPERACTIVITY  . Primidone Other (See Comments)    Medications: Prior to Admission:  (Not in a hospital admission) Scheduled: PRN:  Results for orders placed or performed during the hospital encounter of 05/22/16 (from the past 48 hour(s))  Basic metabolic panel     Status: Abnormal   Collection Time: 05/22/16  5:20 PM  Result Value Ref Range   Sodium 132 (L) 135 - 145 mmol/L   Potassium 5.0 3.5 - 5.1 mmol/L   Chloride 100 (L) 101 - 111 mmol/L   CO2 23 22 - 32 mmol/L   Glucose, Bld 123 (H) 65 - 99 mg/dL   BUN 26 (H) 6 - 20 mg/dL   Creatinine, Ser 1.13 0.61 - 1.24 mg/dL   Calcium 8.2 (L) 8.9 - 10.3 mg/dL   GFR calc non Af Amer 55 (L) >60 mL/min   GFR calc Af Amer >60 >60 mL/min    Comment: (NOTE) The eGFR has been  calculated using the CKD EPI equation. This calculation has not been validated in all clinical situations. eGFR's persistently <60 mL/min signify possible Chronic Kidney Disease.    Anion gap 9 5 - 15  CBC with Differential     Status: Abnormal   Collection Time: 05/22/16  5:20 PM  Result Value Ref Range   WBC 8.7 4.0 - 10.5 K/uL   RBC 2.92 (L) 4.22 - 5.81 MIL/uL   Hemoglobin 9.8 (L) 13.0 - 17.0 g/dL   HCT 28.5 (L) 39.0 - 52.0 %   MCV 97.6 78.0 - 100.0 fL   MCH 33.6 26.0 - 34.0 pg   MCHC 34.4 30.0 - 36.0 g/dL   RDW 15.0 11.5 - 15.5 %   Platelets 262 150 - 400 K/uL   Neutrophils Relative % 80 %   Neutro Abs 7.0 1.7 - 7.7 K/uL   Lymphocytes Relative 7 %   Lymphs Abs 0.6 (L) 0.7 - 4.0 K/uL   Monocytes Relative 12 %   Monocytes Absolute 1.0 0.1 - 1.0 K/uL   Eosinophils Relative 1 %    Eosinophils Absolute 0.1 0.0 - 0.7 K/uL   Basophils Relative 0 %   Basophils Absolute 0.0 0.0 - 0.1 K/uL    Dg Chest 2 View  Result Date: 05/22/2016 CLINICAL DATA:  Left lower leg hematoma for 2 weeks. Laceration with pain. EXAM: CHEST  2 VIEW COMPARISON:  06/26/2015. FINDINGS: Stable cardiomegaly and aortic atherosclerosis. There is a probable hiatal hernia. Mild atelectasis is present at both lung bases. There is no confluent airspace opacity, edema or significant pleural effusion. Evidence of chronic rotator cuff tear on the right. A mid thoracic compression deformity appears grossly stable, not optimally seen on previous portable study. IMPRESSION: No definite acute findings. Cardiomegaly with mild bibasilar atelectasis. Electronically Signed   By: Richardean Sale M.D.   On: 05/22/2016 18:00   Dg Tibia/fibula Left  Result Date: 05/22/2016 CLINICAL DATA:  Deep left leg wound. EXAM: LEFT TIBIA AND FIBULA - 2 VIEW COMPARISON:  None. FINDINGS: Soft tissue defect/laceration noted in the lateral soft tissues. No underlying bony abnormality. No fracture, subluxation or dislocation. Vascular calcifications noted. No radiopaque foreign body. IMPRESSION: No acute bony abnormality. Electronically Signed   By: Rolm Baptise M.D.   On: 05/22/2016 17:59    ROS Blood pressure 102/58, pulse 85, temperature 98.4 F (36.9 C), temperature source Axillary, resp. rate 23, SpO2 100 %. Physical Exam  Orthopaedic Exam: 82 year old male with left leg hematoma, irregular about 6" x 4" with eschar necrotic skin over 80% of the area and open  Areas over the anterior eschar with drainage of serohematoma. The left foot is warm and sensate.   Assessment/Plan: Left Lateral calf hematoma with overlying hematoma and necrotic skin. History of eloquis treatment for Afib. History of rotator cuff injury. Osteoporosis, on reclast, fall with fractures, most recent CT of the spine showing compression deformities T1 and  T3.  Plan:I debrided the left leg hematoma in the ER and performed irrigation of the left leg. Now with leg packed with saline wet to dry, 4x4s, ABDs, and ACE wraps.  Daughter lives in [place] and wants admission to allow for an easier transfer from the current assisted living facility at [facility] to [facility], he apparently has a bed at [facility] beginning tomorrow.   Will need waterpik irrigation, then saline wet to dry dressings and packing under superior fold of skin until the area superior to the wound declares itself.  Recommend dressings be done twice a day until granulating well enough to allow for a vac to be applied or eventual STSG to the hematoma bed following granulation. He can follow up in my office in one week. I used a kerlix soaked in normal saline and packed the upper incision under flap and then applied 4x4s and ABDs and kerlix with ACE wrap to hold in place.      Gerald Sandoval 05/22/2016, 9:32 PM

## 2016-05-22 NOTE — ED Provider Notes (Signed)
Gerald Sandoval Provider Note   CSN: [id number] Arrival date & time: 05/22/16 1223     History    Chief Complaint  Patient presents with  . Wound Check     HPI Gerald Sandoval is a 82 y.o. male.  82yo M w/ PMH below including A fib currently off anticoagulation, RBBB who p/w L lower leg wound. The patient fell on 05/05/16 and sustained a large hematoma on his left lower leg. He has been receiving wound care and follow-up by his PCP. He was recently on Keflex to cover for bacterial infection. Daughter reports that today he followed up routinely with his wound care specialist and primary care provider who both recommended evaluation by orthopedics for a debridement. [facility] was closed today therefore they recommended he go to the ER for evaluation. Daughter states he has not had any fevers, vomiting, or other infectious symptoms. He has not complained of pain although they report high pain threshold. He does have a lot of clear/bloody drainage from the wound. He is not currently on anticoagulation in an attempt to prevent bleeding from wound.   Past Medical History:  Diagnosis Date  . Abscess of left hip    infection left hip  . Anemia   . BPH (benign prostatic hyperplasia)   . Colon polyp   . Diastolic dysfunction   . Dysphagia    unspecified  . Emphysema   . Gastric ulcer   . Gastritis   . Hernia    Rt. Inguinial  . History of colonic polyps   . Hypogonadism in male   . Kidney stones   . Osteoarthritis   . Osteoporosis   . Permanent atrial fibrillation ([place])   . RBBB (right bundle branch block)   . Rotator cuff injury      Patient Active Problem List   Diagnosis Date Noted  . Protein-calorie malnutrition, severe 05/08/2016  . Acute encephalopathy 05/07/2016  . Hematoma of leg, left, initial encounter 05/06/2016  . Hyperglycemia 05/06/2016  . Chronic anemia 05/06/2016  . AKI (acute kidney injury) ([place]) 05/05/2016  . Persistent atrial fibrillation  ([place])   . Chronic anticoagulation-Pradaxa prior to admission 06/28/2015  . RBBB 06/28/2015  . DVT of Rt lower limb, acute ([place]) 06/28/2015  . S/P IVC filter 06/27/15 06/28/2015  . S/P Lt hip replacement, chronically infected [facility]) 06/28/2015  . Lactic acidosis   . Encounter for palliative care   . Goals of care, counseling/discussion   . Fracture - Rt hip prosthesis this adm   . Severe sepsis- found down at nursing home 06/25/2015  . Sepsis ([place]) 06/25/2015  . Edema of right lower extremity 06/25/2015  . History of right hip replacement 06/25/2015  . Hyperkalemia 06/25/2015  . Metabolic acidosis A999333  . EMPHYSEMA 08/03/2009  . OSTEOARTHRITIS 08/03/2009  . ARTHRITIS 08/03/2009  . GASTRIC ULCER 06/02/2009  . GASTRITIS 06/02/2009  . COLONIC POLYPS 05/03/2009  . DYSPHAGIA UNSPECIFIED 05/03/2009    Past Surgical History:  Procedure Laterality Date  . INGUINAL HERNIA REPAIR     RT  . ORIF PERIPROSTHETIC FRACTURE Right 06/29/2015   Procedure: OPEN REDUCTION INTERNAL FIXATION (ORIF) RIGHT HIP PERIPROSTHETIC FRACTURE;  Surgeon: Leandrew Koyanagi, MD;  Location: [place];  Service: Orthopedics;  Laterality: Right;  . Resection of Ribs    . TOTAL HIP ARTHROPLASTY     Both hips; Left in 2004, Right in 2007  . TRANSURETHRAL RESECTION OF PROSTATE          Home Medications  Prior to Admission medications   Medication Sig Start Date End Date Taking? Authorizing Provider  cetirizine (ZYRTEC) 10 MG tablet Take 10 mg by mouth daily.   Yes Historical Provider, MD  Cyanocobalamin (VITAMIN B-12) 2500 MCG SUBL Place 2,500 mcg under the tongue daily.    Yes Historical Provider, MD  ferrous sulfate (IRON SUPPLEMENT) 325 (65 FE) MG tablet Take 325 mg by mouth daily with breakfast.     Yes Historical Provider, MD  finasteride (PROSCAR) 5 MG tablet Take 5 mg by mouth daily.     Yes Historical Provider, MD  metoprolol (TOPROL-XL) 50 MG 24 hr tablet Take 1.5 tablets (75 mg total) by mouth daily.  04/15/11  Yes Thompson Grayer, MD  mirtazapine (REMERON) 15 MG tablet Take 15 mg by mouth at bedtime.   Yes Historical Provider, MD  senna-docusate (SENOKOT-S) 8.6-50 MG tablet Take 1 tablet by mouth at bedtime. Patient taking differently: Take 1 tablet by mouth daily.  07/03/15  Yes Thurnell Lose, MD  sulfamethoxazole-trimethoprim (BACTRIM DS,SEPTRA DS) 800-160 MG tablet Take 1 tablet by mouth 2 (two) times daily.   Yes Historical Provider, MD  tamsulosin (FLOMAX) 0.4 MG CAPS capsule Take 0.4 mg by mouth daily.    Yes Historical Provider, MD  HYDROcodone-acetaminophen (NORCO) 7.5-325 MG tablet Take 1-2 tablets by mouth every 6 (six) hours as needed for moderate pain. Patient not taking: Reported on 05/06/2016 06/29/15   Leandrew Koyanagi, MD      Family History  Problem Relation Age of Onset  . Colon cancer Maternal Uncle   . Diabetes Maternal Aunt      Social History  Substance Use Topics  . Smoking status: Never Smoker  . Smokeless tobacco: Never Used  . Alcohol use No     Allergies     Hydrocodone-acetaminophen; Seroquel [quetiapine]; and Primidone    Review of Systems  10 Systems reviewed and are negative for acute change except as noted in the HPI.   Physical Exam Updated Vital Signs BP 90/64   Pulse 70   Temp 98.4 F (36.9 C) (Axillary)   Resp 18   SpO2 100%   Physical Exam  Constitutional: He is oriented to person, place, and time. No distress.  Thin, frail elderly man resting comfortably  HENT:  Head: Normocephalic and atraumatic.  Moist mucous membranes  Eyes: Conjunctivae are normal.  Neck: Neck supple.  Cardiovascular: Normal rate and normal heart sounds.  An irregularly irregular rhythm present.  No murmur heard. Pulmonary/Chest: Effort normal and breath sounds normal.  Abdominal: Soft. Bowel sounds are normal. He exhibits no distension. There is no tenderness.  Musculoskeletal: He exhibits edema.  Large 10cm x 8cm wound on L lateral lower leg with  mild oozing of blood  Neurological: He is alert and oriented to person, place, and time.  Fluent speech  Skin: Skin is warm.  See photo  Psychiatric: He has a normal mood and affect.  Nursing note and vitals reviewed.      ED Treatments / Results  Labs (all labs ordered are listed, but only abnormal results are displayed) Labs Reviewed  BASIC METABOLIC PANEL - Abnormal; Notable for the following:       Result Value   Sodium 132 (*)    Chloride 100 (*)    Glucose, Bld 123 (*)    BUN 26 (*)    Calcium 8.2 (*)    GFR calc non Af Amer 55 (*)    All other components within normal  limits  CBC WITH DIFFERENTIAL/PLATELET - Abnormal; Notable for the following:    RBC 2.92 (*)    Hemoglobin 9.8 (*)    HCT 28.5 (*)    Lymphs Abs 0.6 (*)    All other components within normal limits     EKG  EKG Interpretation  Date/Time:    Ventricular Rate:    PR Interval:    QRS Duration:   QT Interval:    QTC Calculation:   R Axis:     Text Interpretation:           Radiology Dg Chest 2 View  Result Date: 05/22/2016 CLINICAL DATA:  Left lower leg hematoma for 2 weeks. Laceration with pain. EXAM: CHEST  2 VIEW COMPARISON:  06/26/2015. FINDINGS: Stable cardiomegaly and aortic atherosclerosis. There is a probable hiatal hernia. Mild atelectasis is present at both lung bases. There is no confluent airspace opacity, edema or significant pleural effusion. Evidence of chronic rotator cuff tear on the right. A mid thoracic compression deformity appears grossly stable, not optimally seen on previous portable study. IMPRESSION: No definite acute findings. Cardiomegaly with mild bibasilar atelectasis. Electronically Signed   By: Richardean Sale M.D.   On: 05/22/2016 18:00   Dg Tibia/fibula Left  Result Date: 05/22/2016 CLINICAL DATA:  Deep left leg wound. EXAM: LEFT TIBIA AND FIBULA - 2 VIEW COMPARISON:  None. FINDINGS: Soft tissue defect/laceration noted in the lateral soft tissues. No  underlying bony abnormality. No fracture, subluxation or dislocation. Vascular calcifications noted. No radiopaque foreign body. IMPRESSION: No acute bony abnormality. Electronically Signed   By: Rolm Baptise M.D.   On: 05/22/2016 17:59    Procedures Procedures (including critical care time) Procedures  Medications Ordered in ED  Medications  sodium chloride 0.9 % bolus 1,000 mL (0 mLs Intravenous Stopped 05/22/16 2143)     Initial Impression / Assessment and Plan / ED Course  I have reviewed the triage vital signs and the nursing notes.  Pertinent labs & imaging results that were available during my care of the patient were reviewed by me and considered in my medical decision making (see chart for details).  Clinical Course     PT w/ 2 weeks of L lower leg hematoma turned into chronic wound, sent from PCP and wound clinic for ortho eval and consideration of debridement. He was resting comfortably on exam with reassuring vital signs, afebrile. Large, deep hematoma on left lower leg with some venous oozing. No purulent discharge. Obtained above labs as well as plain film of leg.  Clinic had noted some hypoxia but he has had normal O2 saturation on room air and normal work of breathing here. Chest x-ray without acute findings. Plain film of leg unremarkable. His lab work here is reassuring including normal WBC count. Patient is afebrile and without complaints. I contacted [facility] and discussed with Dr. Louanne Skye, who evaluated the patient in the ED and debrided wound at bedside. I appreciate his assistance with patient's care. Jody, Education officer, museum as well as Mariann Laster, Tourist information centre manager, discussed the patient's care with his family members who did not feel comfortable taking him home due to his requirement of bandage changes. Placement at [facility] has already been initiated and pt will be held overnight in the ED to be placed at facility tomorrow morning.   Final Clinical Impressions(s) /  ED Diagnoses   Final diagnoses:  None     New Prescriptions   No medications on file  Sharlett Iles, MD 05/23/16 [phone]

## 2016-05-22 NOTE — ED Triage Notes (Signed)
Pt here for hematoma to left lower leg x 2 weeks; pt sent from PCP for eval

## 2016-05-22 NOTE — ED Notes (Signed)
Pt is slightly confused according to daughter, but has been his norm since the fall 2 weeks ago

## 2016-05-22 NOTE — NC FL2 (Signed)
[place] LEVEL OF CARE SCREENING TOOL     IDENTIFICATION  Patient Name: Gerald Sandoval Birthdate: 04/18/1924 Sex: male Admission Date (Current Location): 05/22/2016  [facility] and [place] Number:  Herbalist and Address:         Provider Number: [id number]  Attending Physician Name and Address:  Sharlett Iles, MD  Relative Name and Phone Number:       Current Level of Care: Hospital Recommended Level of Care: [place] Prior Approval Number:    Date Approved/Denied:   PASRR Number:    Discharge Plan: SNF    Current Diagnoses: Patient Active Problem List   Diagnosis Date Noted  . Protein-calorie malnutrition, severe 05/08/2016  . Acute encephalopathy 05/07/2016  . Hematoma of leg, left, initial encounter 05/06/2016  . Hyperglycemia 05/06/2016  . Chronic anemia 05/06/2016  . AKI (acute kidney injury) ([place]) 05/05/2016  . Persistent atrial fibrillation ([place])   . Chronic anticoagulation-Pradaxa prior to admission 06/28/2015  . RBBB 06/28/2015  . DVT of Rt lower limb, acute ([place]) 06/28/2015  . S/P IVC filter 06/27/15 06/28/2015  . S/P Lt hip replacement, chronically infected [facility]) 06/28/2015  . Lactic acidosis   . Encounter for palliative care   . Goals of care, counseling/discussion   . Fracture - Rt hip prosthesis this adm   . Severe sepsis- found down at nursing home 06/25/2015  . Sepsis ([place]) 06/25/2015  . Edema of right lower extremity 06/25/2015  . History of right hip replacement 06/25/2015  . Hyperkalemia 06/25/2015  . Metabolic acidosis A999333  . EMPHYSEMA 08/03/2009  . OSTEOARTHRITIS 08/03/2009  . ARTHRITIS 08/03/2009  . GASTRIC ULCER 06/02/2009  . GASTRITIS 06/02/2009  . COLONIC POLYPS 05/03/2009  . DYSPHAGIA UNSPECIFIED 05/03/2009    Orientation RESPIRATION BLADDER Height & Weight     Situation, Place, Time, Self  Normal Incontinent Weight:   Height:     BEHAVIORAL SYMPTOMS/MOOD NEUROLOGICAL  BOWEL NUTRITION STATUS      Continent Diet (heart healthy)  AMBULATORY STATUS COMMUNICATION OF NEEDS Skin   Limited Assist Verbally  (bid dressings to LLE waterpik irrigation saline wet to dry dressings)                       Personal Care Assistance Level of Assistance  Bathing, Feeding, Dressing Bathing Assistance: Limited assistance Feeding assistance: Independent Dressing Assistance: Limited assistance     Functional Limitations Info  Sight, Hearing, Speech Sight Info: Adequate Hearing Info: Adequate Speech Info: Adequate    SPECIAL CARE FACTORS FREQUENCY  PT (By licensed PT), OT (By licensed OT)     PT Frequency:  (5x/week) OT Frequency: 5x/week            Contractures Contractures Info: Present    Additional Factors Info  Code Status (full code)               Current Medications (05/22/2016):  This is the current hospital active medication list No current facility-administered medications for this encounter.    Current Outpatient Prescriptions  Medication Sig Dispense Refill  . cetirizine (ZYRTEC) 10 MG tablet Take 10 mg by mouth daily.    . Cyanocobalamin (VITAMIN B-12) 2500 MCG SUBL Place 2,500 mcg under the tongue daily.     . ferrous sulfate (IRON SUPPLEMENT) 325 (65 FE) MG tablet Take 325 mg by mouth daily with breakfast.      . finasteride (PROSCAR) 5 MG tablet Take 5 mg by mouth daily.      Marland Kitchen  metoprolol (TOPROL-XL) 50 MG 24 hr tablet Take 1.5 tablets (75 mg total) by mouth daily. 30 tablet 8  . mirtazapine (REMERON) 15 MG tablet Take 15 mg by mouth at bedtime.    . senna-docusate (SENOKOT-S) 8.6-50 MG tablet Take 1 tablet by mouth at bedtime. (Patient taking differently: Take 1 tablet by mouth daily. )    . sulfamethoxazole-trimethoprim (BACTRIM DS,SEPTRA DS) 800-160 MG tablet Take 1 tablet by mouth 2 (two) times daily.    . tamsulosin (FLOMAX) 0.4 MG CAPS capsule Take 0.4 mg by mouth daily.     Marland Kitchen HYDROcodone-acetaminophen (NORCO) 7.5-325 MG  tablet Take 1-2 tablets by mouth every 6 (six) hours as needed for moderate pain. (Patient not taking: Reported on 05/06/2016) 90 tablet 0     Discharge Medications: Please see discharge summary for a list of discharge medications.  Relevant Imaging Results:  Relevant Lab Results:   Additional Information  ([id number])  Kanaya Gunnarson M, LCSW

## 2016-05-22 NOTE — Care Management (Addendum)
CM spoke with patient's niece Prudencio Pair [phone]) concerning goals of care, she reports that she has been in contact with admissions at [facility] and patient has a bed, she states that paper work has already been done. Patient has been a [place] with [employer] ([facility]) RN,PT .and private duty. She was concerned that his wound was not improving so was taken to PCP who evaluated and sent to the ED for further evaluation of  Left lower leg wound.   EDP consulted Ortho on management plan.

## 2016-05-22 NOTE — ED Notes (Signed)
Ortho at bedside.

## 2016-05-22 NOTE — Progress Notes (Signed)
Pt's HHC, [facility], had begun to work on [employer] for pt at [facility] several days ago.  However, he will need FL2, [place] number, and insurance authorization before [facility] will admit.  CSW will continue to follow for disposition and facilitate placement as appropriate.

## 2016-05-22 NOTE — NC FL2 (Deleted)
[place] LEVEL OF CARE SCREENING TOOL     IDENTIFICATION  Patient Name: Gerald Sandoval Birthdate: [birth year] Sex: male Admission Date (Current Location): 05/22/2016  [facility] and [place] Number:  Herbalist and Address:         Provider Number: [id number]  Attending Physician Name and Address:  Sharlett Iles, MD  Relative Name and Phone Number:       Current Level of Care: Hospital Recommended Level of Care: [place] Prior Approval Number:    Date Approved/Denied:   PASRR Number:    Discharge Plan: SNF    Current Diagnoses: Patient Active Problem List   Diagnosis Date Noted  . Protein-calorie malnutrition, severe 05/08/2016  . Acute encephalopathy 05/07/2016  . Hematoma of leg, left, initial encounter 05/06/2016  . Hyperglycemia 05/06/2016  . Chronic anemia 05/06/2016  . AKI (acute kidney injury) ([place]) 05/05/2016  . Persistent atrial fibrillation ([place])   . Chronic anticoagulation-Pradaxa prior to admission 06/28/2015  . RBBB 06/28/2015  . DVT of Rt lower limb, acute ([place]) 06/28/2015  . S/P IVC filter 06/27/15 06/28/2015  . S/P Lt hip replacement, chronically infected [facility]) 06/28/2015  . Lactic acidosis   . Encounter for palliative care   . Goals of care, counseling/discussion   . Fracture - Rt hip prosthesis this adm   . Severe sepsis- found down at nursing home 06/25/2015  . Sepsis ([place]) 06/25/2015  . Edema of right lower extremity 06/25/2015  . History of right hip replacement 06/25/2015  . Hyperkalemia 06/25/2015  . Metabolic acidosis A999333  . EMPHYSEMA 08/03/2009  . OSTEOARTHRITIS 08/03/2009  . ARTHRITIS 08/03/2009  . GASTRIC ULCER 06/02/2009  . GASTRITIS 06/02/2009  . COLONIC POLYPS 05/03/2009  . DYSPHAGIA UNSPECIFIED 05/03/2009    Orientation RESPIRATION BLADDER Height & Weight     Situation, Place, Time, Self  Normal Incontinent Weight:   Height:     BEHAVIORAL SYMPTOMS/MOOD NEUROLOGICAL  BOWEL NUTRITION STATUS      Continent Diet (heart healthy)  AMBULATORY STATUS COMMUNICATION OF NEEDS Skin   Limited Assist Verbally  (bid dressings to LLE waterpik irrigation saline wet to dry dressings)                       Personal Care Assistance Level of Assistance  Bathing, Feeding, Dressing Bathing Assistance: Limited assistance Feeding assistance: Independent Dressing Assistance: Limited assistance     Functional Limitations Info  Sight, Hearing, Speech Sight Info: Adequate Hearing Info: Adequate Speech Info: Adequate    SPECIAL CARE FACTORS FREQUENCY  PT (By licensed PT), OT (By licensed OT)     PT Frequency:  (5x/week) OT Frequency: 5x/week            Contractures Contractures Info: Present    Additional Factors Info  Code Status (full code)               Current Medications (05/22/2016):  This is the current hospital active medication list No current facility-administered medications for this encounter.    Current Outpatient Prescriptions  Medication Sig Dispense Refill  . cetirizine (ZYRTEC) 10 MG tablet Take 10 mg by mouth daily.    . Cyanocobalamin (VITAMIN B-12) 2500 MCG SUBL Place 2,500 mcg under the tongue daily.     . ferrous sulfate (IRON SUPPLEMENT) 325 (65 FE) MG tablet Take 325 mg by mouth daily with breakfast.      . finasteride (PROSCAR) 5 MG tablet Take 5 mg by mouth daily.      Marland Kitchen  metoprolol (TOPROL-XL) 50 MG 24 hr tablet Take 1.5 tablets (75 mg total) by mouth daily. 30 tablet 8  . mirtazapine (REMERON) 15 MG tablet Take 15 mg by mouth at bedtime.    . senna-docusate (SENOKOT-S) 8.6-50 MG tablet Take 1 tablet by mouth at bedtime. (Patient taking differently: Take 1 tablet by mouth daily. )    . sulfamethoxazole-trimethoprim (BACTRIM DS,SEPTRA DS) 800-160 MG tablet Take 1 tablet by mouth 2 (two) times daily.    . tamsulosin (FLOMAX) 0.4 MG CAPS capsule Take 0.4 mg by mouth daily.     Marland Kitchen HYDROcodone-acetaminophen (NORCO) 7.5-325 MG  tablet Take 1-2 tablets by mouth every 6 (six) hours as needed for moderate pain. (Patient not taking: Reported on 05/06/2016) 90 tablet 0     Discharge Medications: Please see discharge summary for a list of discharge medications.  Relevant Imaging Results:  Relevant Lab Results:   Additional Information  ([id number])  Abrham Maslowski M, LCSW

## 2016-05-23 DIAGNOSIS — R531 Weakness: Secondary | ICD-10-CM | POA: Diagnosis not present

## 2016-05-23 DIAGNOSIS — K5909 Other constipation: Secondary | ICD-10-CM | POA: Diagnosis not present

## 2016-05-23 DIAGNOSIS — E43 Unspecified severe protein-calorie malnutrition: Secondary | ICD-10-CM | POA: Diagnosis not present

## 2016-05-23 DIAGNOSIS — N400 Enlarged prostate without lower urinary tract symptoms: Secondary | ICD-10-CM | POA: Diagnosis not present

## 2016-05-23 DIAGNOSIS — M90859 Osteopathy in diseases classified elsewhere, unspecified thigh: Secondary | ICD-10-CM | POA: Diagnosis not present

## 2016-05-23 DIAGNOSIS — E871 Hypo-osmolality and hyponatremia: Secondary | ICD-10-CM | POA: Diagnosis not present

## 2016-05-23 DIAGNOSIS — I480 Paroxysmal atrial fibrillation: Secondary | ICD-10-CM | POA: Diagnosis not present

## 2016-05-23 DIAGNOSIS — Z6820 Body mass index (BMI) 20.0-20.9, adult: Secondary | ICD-10-CM | POA: Diagnosis not present

## 2016-05-23 DIAGNOSIS — S81802D Unspecified open wound, left lower leg, subsequent encounter: Secondary | ICD-10-CM | POA: Diagnosis not present

## 2016-05-23 DIAGNOSIS — S8012XS Contusion of left lower leg, sequela: Secondary | ICD-10-CM | POA: Diagnosis not present

## 2016-05-23 DIAGNOSIS — S8012XD Contusion of left lower leg, subsequent encounter: Secondary | ICD-10-CM | POA: Diagnosis not present

## 2016-05-23 DIAGNOSIS — R2689 Other abnormalities of gait and mobility: Secondary | ICD-10-CM | POA: Diagnosis not present

## 2016-05-23 DIAGNOSIS — M6281 Muscle weakness (generalized): Secondary | ICD-10-CM | POA: Diagnosis not present

## 2016-05-23 DIAGNOSIS — Z96642 Presence of left artificial hip joint: Secondary | ICD-10-CM | POA: Diagnosis not present

## 2016-05-23 DIAGNOSIS — J309 Allergic rhinitis, unspecified: Secondary | ICD-10-CM | POA: Diagnosis not present

## 2016-05-23 DIAGNOSIS — R2681 Unsteadiness on feet: Secondary | ICD-10-CM | POA: Diagnosis not present

## 2016-05-23 DIAGNOSIS — M7981 Nontraumatic hematoma of soft tissue: Secondary | ICD-10-CM | POA: Diagnosis not present

## 2016-05-23 DIAGNOSIS — S8012XA Contusion of left lower leg, initial encounter: Secondary | ICD-10-CM | POA: Diagnosis not present

## 2016-05-23 DIAGNOSIS — I10 Essential (primary) hypertension: Secondary | ICD-10-CM | POA: Diagnosis not present

## 2016-05-23 DIAGNOSIS — D62 Acute posthemorrhagic anemia: Secondary | ICD-10-CM | POA: Diagnosis not present

## 2016-05-23 DIAGNOSIS — D6489 Other specified anemias: Secondary | ICD-10-CM | POA: Diagnosis not present

## 2016-05-23 MED ORDER — TRAMADOL HCL 50 MG PO TABS: 50.0000 mg | ORAL_TABLET | Freq: Four times a day (QID) | ORAL | 0 refills | Status: DC | PRN

## 2016-05-23 NOTE — ED Notes (Signed)
Physical therapy being deferred until patient transferred to SNF.

## 2016-05-23 NOTE — NC FL2 (Signed)
[place] LEVEL OF CARE SCREENING TOOL     IDENTIFICATION  Patient Name: Gerald Sandoval Birthdate: 1923-08-18 Sex: male Admission Date (Current Location): 05/22/2016  [facility] and [place] Number:  Herbalist and Address:  [facility] [place] [place] [address] [place], [place] [address]      Provider Number: [id number]  Attending Physician Name and Address:  Sharlett Iles, MD  Relative Name and Phone Number:  Prudencio Pair (Niece) [phone]    Current Level of Care: Hospital Recommended Level of Care: [place] Prior Approval Number:    Date Approved/Denied:   PASRR Number: [id number]  Discharge Plan: SNF    Current Diagnoses: Patient Active Problem List   Diagnosis Date Noted  . Protein-calorie malnutrition, severe 05/08/2016  . Acute encephalopathy 05/07/2016  . Hematoma of leg, left, initial encounter 05/06/2016  . Hyperglycemia 05/06/2016  . Chronic anemia 05/06/2016  . AKI (acute kidney injury) ([place]) 05/05/2016  . Persistent atrial fibrillation ([place])   . Chronic anticoagulation-Pradaxa prior to admission 06/28/2015  . RBBB 06/28/2015  . DVT of Rt lower limb, acute ([place]) 06/28/2015  . S/P IVC filter 06/27/15 06/28/2015  . S/P Lt hip replacement, chronically infected [facility]) 06/28/2015  . Lactic acidosis   . Encounter for palliative care   . Goals of care, counseling/discussion   . Fracture - Rt hip prosthesis this adm   . Severe sepsis- found down at nursing home 06/25/2015  . Sepsis ([place]) 06/25/2015  . Edema of right lower extremity 06/25/2015  . History of right hip replacement 06/25/2015  . Hyperkalemia 06/25/2015  . Metabolic acidosis A999333  . EMPHYSEMA 08/03/2009  . OSTEOARTHRITIS 08/03/2009  . ARTHRITIS 08/03/2009  . GASTRIC ULCER 06/02/2009  . GASTRITIS 06/02/2009  . COLONIC POLYPS 05/03/2009  . DYSPHAGIA UNSPECIFIED 05/03/2009    Orientation RESPIRATION BLADDER Height & Weight      Self, Time, Situation, Place  Normal Incontinent Weight:   Height:     BEHAVIORAL SYMPTOMS/MOOD NEUROLOGICAL BOWEL NUTRITION STATUS   (NONE)  (NONE) Continent Diet  AMBULATORY STATUS COMMUNICATION OF NEEDS Skin   Limited Assist Verbally Other (Comment) (bid dressings to LLE waterpik irrigation saline wet to dry dressings)                       Personal Care Assistance Level of Assistance  Bathing, Feeding, Dressing Bathing Assistance: Limited assistance Feeding assistance: Independent Dressing Assistance: Limited assistance     Functional Limitations Info  Sight, Hearing, Speech Sight Info: Adequate Hearing Info: Adequate Speech Info: Adequate    SPECIAL CARE FACTORS FREQUENCY  PT (By licensed PT), OT (By licensed OT)     PT Frequency: 5X/WEEK OT Frequency: 5X/WEEK            Contractures Contractures Info: Present    Additional Factors Info  Code Status Code Status Info: FULL             Current Medications (05/23/2016):  This is the current hospital active medication list No current facility-administered medications for this encounter.    Current Outpatient Prescriptions  Medication Sig Dispense Refill  . cetirizine (ZYRTEC) 10 MG tablet Take 10 mg by mouth daily.    . Cyanocobalamin (VITAMIN B-12) 2500 MCG SUBL Place 2,500 mcg under the tongue daily.     . ferrous sulfate (IRON SUPPLEMENT) 325 (65 FE) MG tablet Take 325 mg by mouth daily with breakfast.      . finasteride (PROSCAR)  5 MG tablet Take 5 mg by mouth daily.      . metoprolol (TOPROL-XL) 50 MG 24 hr tablet Take 1.5 tablets (75 mg total) by mouth daily. 30 tablet 8  . mirtazapine (REMERON) 15 MG tablet Take 15 mg by mouth at bedtime.    . senna-docusate (SENOKOT-S) 8.6-50 MG tablet Take 1 tablet by mouth at bedtime. (Patient taking differently: Take 1 tablet by mouth daily. )    . sulfamethoxazole-trimethoprim (BACTRIM DS,SEPTRA DS) 800-160 MG tablet Take 1 tablet by mouth 2 (two) times  daily.    . tamsulosin (FLOMAX) 0.4 MG CAPS capsule Take 0.4 mg by mouth daily.     Marland Kitchen HYDROcodone-acetaminophen (NORCO) 7.5-325 MG tablet Take 1-2 tablets by mouth every 6 (six) hours as needed for moderate pain. (Patient not taking: Reported on 05/06/2016) 90 tablet 0     Discharge Medications: Please see discharge summary for a list of discharge medications.  Relevant Imaging Results:  Relevant Lab Results:   Additional Information SS# [id number]  Lind Covert, LCSW

## 2016-05-23 NOTE — Progress Notes (Addendum)
CSW has submitted clinicals and requested initial authorization request with [place]. [facility] hospital liaison to meet with Patient's family at 2:30 and she will be in contact with CSW when paperwork is completed. CSW continues to follow.    Lorrine Kin, MSW, LCSW [facility] ED/63M Clinical Social Worker [phone]  13:16 CSW contacted [place] re: authorization number. Per Venetia Constable [id number]) authorization request still pending.

## 2016-05-23 NOTE — ED Notes (Signed)
Breakfast tray set up for pt.  ?

## 2016-05-23 NOTE — ED Notes (Signed)
Spoke with previous nurse, Whitney, regarding patient being in AFib.  She stated that it is normal.

## 2016-05-23 NOTE — ED Notes (Signed)
Pt assisted in eating graham crackers, apple sauce and orange juice. He ate 100%-[facility]

## 2016-05-23 NOTE — Progress Notes (Signed)
PT Cancellation and Discharge Note  Patient Details Name: Gerald Sandoval MRN: [id number] DOB: 11-Oct-1923   Cancelled Treatment:    Reason Eval/Treat Not Completed: PT screened, no needs identified, will sign off.  Per CM and SW notes pt has been accepted by [facility] and already ahs a bed.  Spoke with RN about if pt truly needs PT while in ED vs deferring PT to SNF.  At this time will defer to SNF and need new order should acute PT needs arise.     [place], [place]  [phone] 05/23/2016, 1:54 PM

## 2016-05-28 ENCOUNTER — Non-Acute Institutional Stay (SKILLED_NURSING_FACILITY): Payer: Medicare Other | Admitting: Internal Medicine

## 2016-05-28 ENCOUNTER — Encounter: Payer: Self-pay | Admitting: Internal Medicine

## 2016-05-28 DIAGNOSIS — J309 Allergic rhinitis, unspecified: Secondary | ICD-10-CM

## 2016-05-28 DIAGNOSIS — E43 Unspecified severe protein-calorie malnutrition: Secondary | ICD-10-CM | POA: Diagnosis not present

## 2016-05-28 DIAGNOSIS — R531 Weakness: Secondary | ICD-10-CM

## 2016-05-28 DIAGNOSIS — D62 Acute posthemorrhagic anemia: Secondary | ICD-10-CM | POA: Diagnosis not present

## 2016-05-28 DIAGNOSIS — Z96642 Presence of left artificial hip joint: Secondary | ICD-10-CM | POA: Diagnosis not present

## 2016-05-28 DIAGNOSIS — K5909 Other constipation: Secondary | ICD-10-CM | POA: Diagnosis not present

## 2016-05-28 DIAGNOSIS — R2681 Unsteadiness on feet: Secondary | ICD-10-CM

## 2016-05-28 DIAGNOSIS — S81802D Unspecified open wound, left lower leg, subsequent encounter: Secondary | ICD-10-CM

## 2016-05-28 DIAGNOSIS — E871 Hypo-osmolality and hyponatremia: Secondary | ICD-10-CM

## 2016-05-28 DIAGNOSIS — I10 Essential (primary) hypertension: Secondary | ICD-10-CM

## 2016-05-28 DIAGNOSIS — N400 Enlarged prostate without lower urinary tract symptoms: Secondary | ICD-10-CM

## 2016-05-28 DIAGNOSIS — I480 Paroxysmal atrial fibrillation: Secondary | ICD-10-CM

## 2016-05-28 NOTE — Progress Notes (Signed)
LOCATION: [place]  PCP: Jerlyn Ly, MD   Code Status: DNR  Goals of care: Advanced Directive information Advanced Directives 05/28/2016  Does Patient Have a Medical Advance Directive? Yes  Type of Advance Directive Out of facility DNR (pink MOST or yellow form)  Does patient want to make changes to medical advance directive? No - Patient declined  Copy of [place] in Chart? -       Extended Emergency Contact Information Primary Emergency Contact: Dixon,Lynn Address: [address]          [place], [place] [address] Johnnette Litter of [place] Phone: [phone] Mobile Phone: [phone] Relation: Niece Secondary Emergency Contact: Marcille Buffy Address: [address]          [place], [place] [address] Johnnette Litter of [place] Phone: [phone] Relation: Friend   Allergies  Allergen Reactions  . Hydrocodone-Acetaminophen Other (See Comments)    DELIRIUM, AGITATION, HYPERACTIVITY  . Seroquel [Quetiapine] Other (See Comments)    DELIRIUM, AGITATION, HYPERACTIVITY  . Primidone Other (See Comments)    Chief Complaint  Patient presents with  . New Admit To SNF    New Admission Visit      HPI:  Patient is a 83 y.o. male seen today for short term rehabilitation post hospital admission from 05/05/2016-05/09/2016 post fall with large left leg hematoma. His eliquis was held and he was discharged home. On 05/19/2016 he was in the emergency room with worsening pain and bleeding from the wound. There was concern for infection and he was started on Keflex. On 05/22/2016, patient was again in the emergency department with pain and increased drainage from his wound. He underwent surgical debridement of the wound by Dr. Delane Ginger from [facility] in the emergency room and was sent to the skilled nursing facility to undergo rehabilitation. He has medical history of atrial fibrillation, history of chronic disease among others. he is seen in his room today.    Review of Systems:  Constitutional: Negative for fever, chills, diaphoresis.  HENT: Negative for headache, congestion, nasal discharge. He is hard of hearing. Eyes: Negative for double vision and discharge. Wears glasses.   Respiratory: Negative for cough, shortness of breath and wheezing.   Cardiovascular: Negative for chest pain, palpitations, leg swelling.  Gastrointestinal: Negative for heartburn, nausea, vomiting, abdominal pain. Doesn't remember his last bowel movement. Not passing gas. Positive for poor appetite and chronic constipation. Genitourinary: Negative for dysuria and flank pain.  Musculoskeletal: Negative for back pain, fall in the facility.  Skin: Negative for itching, rash.  Neurological: Negative for dizziness. Psychiatric/Behavioral: Negative for depression.   Past Medical History:  Diagnosis Date  . Abscess of left hip    infection left hip  . Anemia   . BPH (benign prostatic hyperplasia)   . Colon polyp   . Diastolic dysfunction   . Dysphagia    unspecified  . Emphysema   . Gastric ulcer   . Gastritis   . Hernia    Rt. Inguinial  . History of colonic polyps   . Hypogonadism in male   . Kidney stones   . Osteoarthritis   . Osteoporosis   . Permanent atrial fibrillation ([place])   . RBBB (right bundle branch block)   . Rotator cuff injury    Past Surgical History:  Procedure Laterality Date  . INGUINAL HERNIA REPAIR     RT  . ORIF PERIPROSTHETIC FRACTURE Right 06/29/2015   Procedure: OPEN REDUCTION INTERNAL FIXATION (ORIF) RIGHT HIP PERIPROSTHETIC FRACTURE;  Surgeon: Leandrew Koyanagi, MD;  Location: [place];  Service: Orthopedics;  Laterality: Right;  . Resection of Ribs    . TOTAL HIP ARTHROPLASTY     Both hips; Left in 2004, Right in 2007  . TRANSURETHRAL RESECTION OF PROSTATE     Social History:   reports that he has never smoked. He has never used smokeless tobacco. He reports that he does not drink alcohol or use drugs.  Family History  Problem  Relation Age of Onset  . Colon cancer Maternal Uncle   . Diabetes Maternal Aunt     Medications: Allergies as of 05/28/2016      Reactions   Hydrocodone-acetaminophen Other (See Comments)   DELIRIUM, AGITATION, HYPERACTIVITY   Seroquel [quetiapine] Other (See Comments)   DELIRIUM, AGITATION, HYPERACTIVITY   Primidone Other (See Comments)      Medication List       Accurate as of 05/28/16 11:06 AM. Always use your most recent med list.          cetirizine 10 MG tablet Commonly known as:  ZYRTEC Take 10 mg by mouth daily.   finasteride 5 MG tablet Commonly known as:  PROSCAR Take 5 mg by mouth daily.   IRON SUPPLEMENT 325 (65 FE) MG tablet Generic drug:  ferrous sulfate Take 325 mg by mouth daily with breakfast.   metoprolol succinate 50 MG 24 hr tablet Commonly known as:  TOPROL-XL Take 1.5 tablets (75 mg total) by mouth daily.   mirtazapine 15 MG tablet Commonly known as:  REMERON Take 15 mg by mouth at bedtime.   senna-docusate 8.6-50 MG tablet Commonly known as:  Senokot-S Take 1 tablet by mouth at bedtime.   sulfamethoxazole-trimethoprim 800-160 MG tablet Commonly known as:  BACTRIM DS,SEPTRA DS Take 1 tablet by mouth 2 (two) times daily.   tamsulosin 0.4 MG Caps capsule Commonly known as:  FLOMAX Take 0.4 mg by mouth daily.   UNABLE TO FIND Med Name: Med pass 120 mL by mouth 3 times daily   Vitamin B-12 2500 MCG Subl Place 2,500 mcg under the tongue daily.       Immunizations:  There is no immunization history on file for this patient.   Physical Exam: Vitals:   05/28/16 1056  BP: 118/65  Pulse: 84  Resp: 20  Temp: 98.5 F (36.9 C)  TempSrc: Oral  SpO2: 98%  Weight: 132 lb (59.9 kg)  Height: 5\' 9"  (1.753 m)   Body mass index is 19.49 kg/m.  General- elderly frail and thin built male,  in no acute distress Head- normocephalic, atraumatic, edentulous Cardiovascular- irregular heart rate, no murmur Respiratory- bilateral clear to  auscultation, no wheeze, no rhonchi, no crackles, no use of accessory muscles Abdomen- bowel sounds present, soft, non tender Musculoskeletal- able to move all 4 extremities, generalized weakness mainly to his lower extremities, limited range of motion to right shoulder Neurological- alert and oriented to person, place and time Skin- warm and dry, left lower leg open wound 11.5 x 7 x 2.4 cm status post debridement, irregular border with open wound edge, fragile wound base with some slough and some granulation tissue, the wound surface bleeds easily, some undermining and tunneling noted, periwound mild erythema noted, chronic skin changes to both his lower legs Psychiatry- normal mood and affect    Labs reviewed: Basic Metabolic Panel:  Recent Labs  05/07/16 0539 05/08/16 1000 05/09/16 0355 05/19/16 0347 05/22/16 1720  NA 136 140 139 132* 132*  K 3.3* 3.6 4.1  4.9 5.0  CL 102 109 106 100* 100*  CO2 22 24 25 25 23   GLUCOSE 140* 153* 117* 114* 123*  BUN 15 13 19  37* 26*  CREATININE 1.30* 0.88 0.79 1.01 1.13  CALCIUM 8.6* 8.2* 8.4* 8.6* 8.2*  MG 1.8 1.7  --   --   --    Liver Function Tests:  Recent Labs  06/30/15 0414 05/07/16 0539 05/08/16 1000  AST 36 48* 43*  ALT 21 28 25   ALKPHOS 39 50 40  BILITOT 1.2 1.4* 1.4*  PROT 4.3* 6.0* 5.2*  ALBUMIN 2.2* 3.7 3.0*   No results for input(s): LIPASE, AMYLASE in the last 8760 hours.  Recent Labs  05/07/16 1629  AMMONIA 20   CBC:  Recent Labs  05/08/16 1000 05/09/16 0355 05/19/16 0347 05/22/16 1720  WBC 10.5 9.2 7.6 8.7  NEUTROABS 9.0*  --  6.1 7.0  HGB 9.3* 9.7* 10.1* 9.8*  HCT 27.0* 28.3* 28.4* 28.5*  MCV 98.5 98.6 97.9 97.6  PLT 86* 109* 264 262   Cardiac Enzymes:  Recent Labs  06/25/15 0832 06/26/15 1837 06/26/15 2220 06/28/15 0630 05/07/16 0539  CKTOTAL 444* 1,042*  --  435* 325  TROPONINI 0.04* 0.07* 0.09*  --   --    BNP: Invalid input(s): POCBNP CBG:  Recent Labs  05/06/16 1211  05/06/16 1714 05/07/16 1215  GLUCAP 113* 154* 148*    Radiological Exams: Dg Chest 2 View  Result Date: 05/22/2016 CLINICAL DATA:  Left lower leg hematoma for 2 weeks. Laceration with pain. EXAM: CHEST  2 VIEW COMPARISON:  06/26/2015. FINDINGS: Stable cardiomegaly and aortic atherosclerosis. There is a probable hiatal hernia. Mild atelectasis is present at both lung bases. There is no confluent airspace opacity, edema or significant pleural effusion. Evidence of chronic rotator cuff tear on the right. A mid thoracic compression deformity appears grossly stable, not optimally seen on previous portable study. IMPRESSION: No definite acute findings. Cardiomegaly with mild bibasilar atelectasis. Electronically Signed   By: Richardean Sale M.D.   On: 05/22/2016 18:00   Dg Tibia/fibula Left  Result Date: 05/22/2016 CLINICAL DATA:  Deep left leg wound. EXAM: LEFT TIBIA AND FIBULA - 2 VIEW COMPARISON:  None. FINDINGS: Soft tissue defect/laceration noted in the lateral soft tissues. No underlying bony abnormality. No fracture, subluxation or dislocation. Vascular calcifications noted. No radiopaque foreign body. IMPRESSION: No acute bony abnormality. Electronically Signed   By: Rolm Baptise M.D.   On: 05/22/2016 17:59   Dg Tibia/fibula Left  Result Date: 05/19/2016 CLINICAL DATA:  Infection and bleeding of mid leg EXAM: LEFT TIBIA AND FIBULA - 2 VIEW COMPARISON:  Tibia/ fibula radiograph 05/05/2016. FINDINGS: There is no evidence of fracture or other focal bone lesions. Vascular calcifications are noted. There is persistent soft tissue swelling of the lateral aspect of the left leg. IMPRESSION: Persistent soft tissue swelling of the lateral aspect of the left leg without focal osseous abnormality. Electronically Signed   By: Ulyses Jarred M.D.   On: 05/19/2016 03:45   Dg Tibia/fibula Left  Result Date: 05/05/2016 CLINICAL DATA:  Pain after fall with hematoma noted laterally. EXAM: LEFT TIBIA AND  FIBULA - 2 VIEW COMPARISON:  None. FINDINGS: No acute displaced fracture or bone destruction. Popliteal and tibial arteriosclerosis. The knee and ankle joints are maintained. Small ossicles are noted off the tip of the fibula consistent with old remote trauma. More focal calf soft tissue swelling noted posterolaterally the left leg consistent with reported hematoma. IMPRESSION: Soft tissue swelling  of the posterolateral leg without acute osseous abnormality. Electronically Signed   By: Ashley Royalty M.D.   On: 05/05/2016 20:00   Ct Head Wo Contrast  Result Date: 05/05/2016 CLINICAL DATA:  Headache and neck pain after fall today. No loss of consciousness. No EXAM: CT HEAD WITHOUT CONTRAST CT CERVICAL SPINE WITHOUT CONTRAST TECHNIQUE: Multidetector CT imaging of the head and cervical spine was performed following the standard protocol without intravenous contrast. Multiplanar CT image reconstructions of the cervical spine were also generated. COMPARISON:  None. FINDINGS: CT HEAD FINDINGS BRAIN: There is sulcal and ventricular prominence consistent with superficial and central atrophy. No intraparenchymal hemorrhage, mass effect nor midline shift. There is a chronic stable moderate degree of patchy periventricular and subcortical white matter hypodensity consistent with chronic small vessel ischemic disease. No acute large vascular territory infarcts. No abnormal extra-axial fluid collections. Basal cisterns are patent. VASCULAR: Moderate calcific atherosclerosis of the carotid siphons. SKULL: No skull fracture. No significant scalp soft tissue swelling. The walls of the left maxillary sinus are somewhat thickened relative to right likely related to change from chronic sinusitis. There bold SINUSES/ORBITS: The mastoid air-cells are clear. The included paranasal sinuses are well-aerated.The included ocular globes and orbital contents are non-suspicious. OTHER: None. CT CERVICAL SPINE FINDINGS Alignment: Normal  alignment of the cervical spine. Skull base and vertebrae: There are anterior wedge compression fractures of T1 and T3 possibly be with T3 as is not apparent on the lateral chest radiograph from 06/22/2007 and also incompletely included on the prior cervical spine CT from 06/25/2015. No retropulsion is identified. Soft tissues and spinal canal: No spinal stenosis nor intra canal hemorrhage. Disc levels: Chronic mild disc space narrowing at C5-6 with small posterior marginal osteophytes. Facet joints are maintained without malalignment. There is mild multilevel degenerative facet arthropathy more so on the right from C3 through C6 and on the left at C2-3. The atlantodental interval is maintained. Upper chest: Negative for acute disease Other: Atherosclerosis of the visualized aortic arch. IMPRESSION: Chronic small vessel ischemic disease of periventricular and subcortical white matter. No acute intracranial abnormality. There is mild anterior wedge compression deformity of the T3 vertebral body which appears new relative to the prior CT from 06/25/2015. It is however incompletely included on the prior exam. No retropulsion nor intraspinal hematoma is seen. Clinical correlation would be helpful over the T3 level. Non emergent MRI or bone scan may also help for further evaluation. Stable mild compression of T1. No acute cervical spinal fracture. Degenerative disc disease at C5-6. Electronically Signed   By: Ashley Royalty M.D.   On: 05/05/2016 20:20   Ct Cervical Spine Wo Contrast  Result Date: 05/05/2016 CLINICAL DATA:  Headache and neck pain after fall today. No loss of consciousness. No EXAM: CT HEAD WITHOUT CONTRAST CT CERVICAL SPINE WITHOUT CONTRAST TECHNIQUE: Multidetector CT imaging of the head and cervical spine was performed following the standard protocol without intravenous contrast. Multiplanar CT image reconstructions of the cervical spine were also generated. COMPARISON:  None. FINDINGS: CT HEAD  FINDINGS BRAIN: There is sulcal and ventricular prominence consistent with superficial and central atrophy. No intraparenchymal hemorrhage, mass effect nor midline shift. There is a chronic stable moderate degree of patchy periventricular and subcortical white matter hypodensity consistent with chronic small vessel ischemic disease. No acute large vascular territory infarcts. No abnormal extra-axial fluid collections. Basal cisterns are patent. VASCULAR: Moderate calcific atherosclerosis of the carotid siphons. SKULL: No skull fracture. No significant scalp soft tissue swelling. The walls of  the left maxillary sinus are somewhat thickened relative to right likely related to change from chronic sinusitis. There bold SINUSES/ORBITS: The mastoid air-cells are clear. The included paranasal sinuses are well-aerated.The included ocular globes and orbital contents are non-suspicious. OTHER: None. CT CERVICAL SPINE FINDINGS Alignment: Normal alignment of the cervical spine. Skull base and vertebrae: There are anterior wedge compression fractures of T1 and T3 possibly be with T3 as is not apparent on the lateral chest radiograph from 06/22/2007 and also incompletely included on the prior cervical spine CT from 06/25/2015. No retropulsion is identified. Soft tissues and spinal canal: No spinal stenosis nor intra canal hemorrhage. Disc levels: Chronic mild disc space narrowing at C5-6 with small posterior marginal osteophytes. Facet joints are maintained without malalignment. There is mild multilevel degenerative facet arthropathy more so on the right from C3 through C6 and on the left at C2-3. The atlantodental interval is maintained. Upper chest: Negative for acute disease Other: Atherosclerosis of the visualized aortic arch. IMPRESSION: Chronic small vessel ischemic disease of periventricular and subcortical white matter. No acute intracranial abnormality. There is mild anterior wedge compression deformity of the T3  vertebral body which appears new relative to the prior CT from 06/25/2015. It is however incompletely included on the prior exam. No retropulsion nor intraspinal hematoma is seen. Clinical correlation would be helpful over the T3 level. Non emergent MRI or bone scan may also help for further evaluation. Stable mild compression of T1. No acute cervical spinal fracture. Degenerative disc disease at C5-6. Electronically Signed   By: Ashley Royalty M.D.   On: 05/05/2016 20:20   Ct Pelvis Wo Contrast  Result Date: 05/05/2016 CLINICAL DATA:  Bilateral hip pain after a fall today, greater on the right. Prior arthroplasties. EXAM: CT PELVIS WITHOUT CONTRAST TECHNIQUE: Multidetector CT imaging of the pelvis was performed following the standard protocol without intravenous contrast. COMPARISON:  Hip radiographs earlier today. CT abdomen and pelvis 06/26/2015. FINDINGS: Urinary Tract: Partially visualized right lower pole renal cyst. Bladder is largely obscured by streak artifact. Bowel: Left-sided colonic diverticulosis without evidence of diverticulitis. Vascular/Lymphatic: Extensive, diffuse atherosclerotic vascular calcification. No enlarged lymph nodes. Reproductive:  Prostate obscured by artifact. Other: No intraperitoneal free fluid identified within limitations of artifact. No lower abdominal wall mass or hernia. Musculoskeletal: Bilateral total hip arthroplasties are again seen with associated streak artifact. There is also lateral plate and screw and cerclage wire fixation of an old periprosthetic right femur fracture at the level of the femoral stem. No acute fracture is identified within limitations of streak artifact. IMPRESSION: Bilateral total hip arthroplasties and old healed right periprosthetic femur fracture. No acute fracture identified. Electronically Signed   By: Logan Bores M.D.   On: 05/05/2016 21:40   Dg Hips Bilat W Or Wo Pelvis 3-4 Views  Result Date: 05/05/2016 CLINICAL DATA:  Bilateral hip  pain after fall left worse than right with large hematoma lateral to the tibia and fibula. EXAM: DG HIP (WITH OR WITHOUT PELVIS) 3-4V BILAT COMPARISON:  06/25/2015 pelvic and right hip radiographs. CT 06/25/2015 of the right femur. FINDINGS: Bilateral hip arthroplasties with cerclage wire fixation are noted bilaterally. Plate and screw fixation of the proximal right femur is noted since prior exam extending approximately 7.4 cm beyond the tip of the prosthetic femoral stem. Calcified atherosclerosis of the external iliac through femoral arteries bilaterally. Heterotopic new bone formation is seen bilaterally more so on the left. IMPRESSION: No acute osseous abnormality. Bilateral hip arthroplasties supported by by plate and screw  fixation on the right and cerclage wires bilaterally. Electronically Signed   By: Ashley Royalty M.D.   On: 05/05/2016 19:57    Assessment/Plan  Unsteady gait With frequent falls. Will have him to work with physical therapy and occupational therapy for gait training. Fall precautions to be taken.  Generalized weakness From physical deconditioning. Will have him work with physical therapy and occupational therapy to help regain her strength and balance.  Traumatic open wound of left lower leg Status post surgical debridement in the emergency room. Has follow-up with orthopedic on 05/29/2016. Will get wound care consult for patient to be followed. Start decubivite for her to promote wound healing. Will need gel overlay mattress to prevent pressure ulcer given his limited mobility.  Blood loss anemia With recent hematoma status post a pretreatment and had some blood loss. Off anticoagulation at present. Check CBC. Continue ferrous sulfate supplement.  Hyponatremia Monitor BMP  Severe protein calorie malnutrition Registered dietitian to evaluate. Monitor weekly weight. Encouraged oral intake. Continue Remeron to help stimulate appetite. Continue his feeding  supplement.  Atrial fibrillation Rate controlled. Continue metoprolol 75 mg daily. Off anticoagulation because of recent fall and hematoma. Monitor clinically  Chronic constipation Continue senna-docusate 1 tab by mouth daily at bedtime.  Hypertension Monitor blood pressure readings every shift. Continue Toprol-XL 75 mg daily.  BPH Continue finasteride and Flomax.  Allergic rhinitis Continue sedimentation rates and 10 mg daily.  History of hip replacement with chronic infection Continue chronic prophylaxis with Bactrim double strength 1 tablet every 12 hours.    Goals of care: short term rehabilitation   Labs/tests ordered: cbc, cmp 05/29/16  Family/ staff Communication: reviewed care plan with patient and nursing supervisor    Blanchie Serve, MD Internal Medicine [place], [place] [address] Cell Phone (Monday-Friday 8 am - 5 pm): [phone] On Call: [phone] and follow prompts after 5 pm and on weekends Office Phone: [phone] Office Fax: [phone]

## 2016-05-30 ENCOUNTER — Telehealth (INDEPENDENT_AMBULATORY_CARE_PROVIDER_SITE_OTHER): Payer: Self-pay | Admitting: Orthopaedic Surgery

## 2016-05-30 LAB — CBC AND DIFFERENTIAL
HEMATOCRIT: 28 % — AB (ref 41–53)
Hemoglobin: 9.3 g/dL — AB (ref 13.5–17.5)
Neutrophils Absolute: 4 /uL
Platelets: 205 10*3/uL (ref 150–399)
WBC: 5.4 10*3/mL

## 2016-05-30 LAB — BASIC METABOLIC PANEL
BUN: 14 mg/dL (ref 4–21)
Creatinine: 0.9 mg/dL (ref 0.6–1.3)
GLUCOSE: 129 mg/dL
Potassium: 4.8 mmol/L (ref 3.4–5.3)
Sodium: 138 mmol/L (ref 137–147)

## 2016-05-30 LAB — HEPATIC FUNCTION PANEL
ALT: 35 U/L (ref 10–40)
AST: 28 U/L (ref 14–40)
Alkaline Phosphatase: 138 U/L — AB (ref 25–125)
BILIRUBIN, TOTAL: 0.5 mg/dL

## 2016-05-30 NOTE — Telephone Encounter (Signed)
Please advise 

## 2016-05-30 NOTE — Telephone Encounter (Signed)
yes

## 2016-05-30 NOTE — Telephone Encounter (Signed)
Patient's niece and POA Prudencio Pair) called asked if Dr Erlinda Hong can order a WD Vac for the patient. She asked if the order can be done today if possible. The number to contact the WD care nurse Anderson Malta  is [place]. The number to contact her is [phone]

## 2016-05-31 NOTE — Telephone Encounter (Signed)
Called jennifer nurse back at [phone] and advised that per Dr Erlinda Hong he approved on Wound vac and jennifer will write order and pt will get wound vac

## 2016-06-03 ENCOUNTER — Ambulatory Visit (INDEPENDENT_AMBULATORY_CARE_PROVIDER_SITE_OTHER): Payer: Medicare Other | Admitting: Orthopaedic Surgery

## 2016-06-03 DIAGNOSIS — S8012XA Contusion of left lower leg, initial encounter: Secondary | ICD-10-CM | POA: Diagnosis not present

## 2016-06-03 NOTE — Progress Notes (Signed)
Office Visit Note   Patient: Gerald Sandoval           Date of Birth: Jul 13, 1923           MRN: [id number] Visit Date: 06/03/2016              Requested by: Crist Infante, MD [address] [place], [place] [address] PCP: Jerlyn Ly, MD   Assessment & Plan: Visit Diagnoses:  1. Hematoma of leg, left, initial encounter     Plan: At this point I want him to continue with wound VAC for 2 more weeks and I'll want to see some more improvement. I do think that he will benefit from skin grafting of the wound.  Follow-Up Instructions: Return in about 2 weeks (around 06/17/2016).   Orders:  No orders of the defined types were placed in this encounter.  No orders of the defined types were placed in this encounter.     Procedures: No procedures performed   Clinical Data: No additional findings.   Subjective: Chief Complaint  Patient presents with  . Left Leg - Drainage from Incision    Gerald Sandoval is status post hematoma debridement on December 10 by Dr. Louanne Skye in the hospital. He has had a wound VAC since Friday. He denies any drainage. He denies any pain.    Review of Systems   Objective: Vital Signs: There were no vitals taken for this visit.  Physical Exam  Ortho Exam Physical exam shows good healing beefy red wound bed with viable skin edges. Specialty Comments:  No specialty comments available.  Imaging: No results found.   PMFS History: Patient Active Problem List   Diagnosis Date Noted  . Protein-calorie malnutrition, severe 05/08/2016  . Acute encephalopathy 05/07/2016  . Hematoma of leg, left, initial encounter 05/06/2016  . Hyperglycemia 05/06/2016  . Chronic anemia 05/06/2016  . AKI (acute kidney injury) ([place]) 05/05/2016  . Persistent atrial fibrillation ([place])   . Chronic anticoagulation-Pradaxa prior to admission 06/28/2015  . RBBB 06/28/2015  . DVT of Rt lower limb, acute ([place]) 06/28/2015  . S/P IVC filter 06/27/15 06/28/2015  . S/P Lt hip  replacement, chronically infected [facility]) 06/28/2015  . Lactic acidosis   . Encounter for palliative care   . Goals of care, counseling/discussion   . Fracture - Rt hip prosthesis this adm   . Severe sepsis- found down at nursing home 06/25/2015  . Sepsis ([place]) 06/25/2015  . Edema of right lower extremity 06/25/2015  . History of right hip replacement 06/25/2015  . Hyperkalemia 06/25/2015  . Metabolic acidosis [id number]  . EMPHYSEMA 08/03/2009  . OSTEOARTHRITIS 08/03/2009  . ARTHRITIS 08/03/2009  . GASTRIC ULCER 06/02/2009  . GASTRITIS 06/02/2009  . COLONIC POLYPS 05/03/2009  . DYSPHAGIA UNSPECIFIED 05/03/2009   Past Medical History:  Diagnosis Date  . Abscess of left hip    infection left hip  . Anemia   . BPH (benign prostatic hyperplasia)   . Colon polyp   . Diastolic dysfunction   . Dysphagia    unspecified  . Emphysema   . Gastric ulcer   . Gastritis   . Hernia    Rt. Inguinial  . History of colonic polyps   . Hypogonadism in male   . Kidney stones   . Osteoarthritis   . Osteoporosis   . Permanent atrial fibrillation ([place])   . RBBB (right bundle branch block)   . Rotator cuff injury     Family History  Problem Relation Age of  Onset  . Colon cancer Maternal Uncle   . Diabetes Maternal Aunt     Past Surgical History:  Procedure Laterality Date  . INGUINAL HERNIA REPAIR     RT  . ORIF PERIPROSTHETIC FRACTURE Right 06/29/2015   Procedure: OPEN REDUCTION INTERNAL FIXATION (ORIF) RIGHT HIP PERIPROSTHETIC FRACTURE;  Surgeon: Leandrew Koyanagi, MD;  Location: [facility];  Service: Orthopedics;  Laterality: Right;  . Resection of Ribs    . TOTAL HIP ARTHROPLASTY     Both hips; Left in 2004, Right in 2007  . TRANSURETHRAL RESECTION OF PROSTATE     Social History   Occupational History  . Retired: [employer]    Social History Main Topics  . Smoking status: Never Smoker  . Smokeless tobacco: Never Used  . Alcohol use No  . Drug use: No  . Sexual activity:  Not on file

## 2016-06-07 ENCOUNTER — Non-Acute Institutional Stay (SKILLED_NURSING_FACILITY): Payer: Medicare Other | Admitting: Adult Health

## 2016-06-07 ENCOUNTER — Encounter: Payer: Self-pay | Admitting: Adult Health

## 2016-06-07 DIAGNOSIS — E43 Unspecified severe protein-calorie malnutrition: Secondary | ICD-10-CM

## 2016-06-07 DIAGNOSIS — K5909 Other constipation: Secondary | ICD-10-CM

## 2016-06-07 DIAGNOSIS — R531 Weakness: Secondary | ICD-10-CM | POA: Diagnosis not present

## 2016-06-07 DIAGNOSIS — N400 Enlarged prostate without lower urinary tract symptoms: Secondary | ICD-10-CM | POA: Diagnosis not present

## 2016-06-07 DIAGNOSIS — I10 Essential (primary) hypertension: Secondary | ICD-10-CM

## 2016-06-07 DIAGNOSIS — I480 Paroxysmal atrial fibrillation: Secondary | ICD-10-CM | POA: Diagnosis not present

## 2016-06-07 DIAGNOSIS — Z96642 Presence of left artificial hip joint: Secondary | ICD-10-CM | POA: Diagnosis not present

## 2016-06-07 DIAGNOSIS — J309 Allergic rhinitis, unspecified: Secondary | ICD-10-CM

## 2016-06-07 DIAGNOSIS — S81802D Unspecified open wound, left lower leg, subsequent encounter: Secondary | ICD-10-CM

## 2016-06-07 DIAGNOSIS — D62 Acute posthemorrhagic anemia: Secondary | ICD-10-CM

## 2016-06-07 NOTE — Progress Notes (Signed)
DATE:  06/07/2016   MRN:  [id number]  BIRTHDAY: 1923-12-17  Facility:  Nursing Home Location:  [facility] Room Number: 1003-A  LEVEL OF CARE:  SNF [phone])  Contact Information    Name [place] Niece [phone]  [phone]   Eben Burow [phone]     Dixon,Will    [phone]   Jones,Catherine Niece [phone]         Code Status History    Date Active Date Inactive Code Status Order ID Comments User Context   05/06/2016 12:38 AM 05/09/2016  9:51 PM DNR [id number]  Karmen Bongo, MD Inpatient   06/25/2015  3:48 PM 07/03/2015  9:23 PM DNR [id number]  Willia Craze, NP Inpatient   06/25/2015 10:04 AM 06/25/2015  3:48 PM DNR [id number]  Leo Grosser, MD ED    Questions for Most Recent Historical Code Status (Order [id number])    Question Answer Comment   In the event of cardiac or respiratory ARREST Do not call a "code blue"    In the event of cardiac or respiratory ARREST Do not perform Intubation, CPR, defibrillation or ACLS    In the event of cardiac or respiratory ARREST Use medication by any route, position, wound care, and other measures to relive pain and suffering. May use oxygen, suction and manual treatment of airway obstruction as needed for comfort.         Advance Directive Documentation   Flowsheet Row Most Recent Value  Type of Advance Directive  Out of facility DNR (pink MOST or yellow form)  Pre-existing out of facility DNR order (yellow form or pink MOST form)  No data  "MOST" Form in Place?  No data       Chief Complaint  Patient presents with  . Discharge Note    HISTORY OF PRESENT ILLNESS:  This is a 52-YO male seen for a discharge visit.  He was admitted to [facility] on 05/23/2016 for short-term rehabilitation following an ER encounter at [facility] for a fall with left leg hematoma with increasing pain and drainage. He had wound debridement in the emergency  room. He then was sent to [facility] for short-term rehabilitation.    He is being discharged back to [facility] on 06/09/2016 with home health PT, OT, SW, CNA, and Nursing services.  He will also discharge with DME of semi-electric hospital bed.    Patient was admitted to this facility for short-term rehabilitation after the patient's recent hospitalization.  Patient has completed SNF rehabilitation and therapy has cleared the patient for discharge.   PAST MEDICAL HISTORY:  Past Medical History:  Diagnosis Date  . Abscess of left hip    infection left hip  . Anemia   . BPH (benign prostatic hyperplasia)   . Colon polyp   . Diastolic dysfunction   . Dysphagia    unspecified  . Emphysema   . Gastric ulcer   . Gastritis   . Hernia    Rt. Inguinial  . History of colonic polyps   . Hypogonadism in male   . Kidney stones   . Osteoarthritis   . Osteoporosis   . Permanent atrial fibrillation ([place])   . RBBB (right bundle branch block)   . Rotator cuff injury      CURRENT MEDICATIONS: Reviewed  Patient's Medications  New Prescriptions   No medications on file  Previous Medications   CETIRIZINE (ZYRTEC) 10  MG TABLET    Take 10 mg by mouth daily.    CYANOCOBALAMIN (VITAMIN B-12) 2500 MCG SUBL    Place 2,500 mcg under the tongue daily.    FERROUS SULFATE (IRON SUPPLEMENT) 325 (65 FE) MG TABLET    Take 325 mg by mouth daily with breakfast.     FINASTERIDE (PROSCAR) 5 MG TABLET    Take 5 mg by mouth daily.    GERIATRIC MULTIVITAMINS-MINERALS (ELDERTONIC/GEVRABON) ELIX    Take 15 mLs by mouth 2 (two) times daily.   METOPROLOL (TOPROL-XL) 50 MG 24 HR TABLET    Take 1.5 tablets (75 mg total) by mouth daily.   MIRTAZAPINE (REMERON) 15 MG TABLET    Take 15 mg by mouth at bedtime.    MULTIPLE VITAMINS-MINERALS (DECUBI-VITE) CAPS    Take 1 capsule by mouth daily.   SENNA-DOCUSATE (SENOKOT-S) 8.6-50 MG TABLET    Take 1 tablet by mouth at bedtime.    SULFAMETHOXAZOLE-TRIMETHOPRIM (BACTRIM DS,SEPTRA DS) 800-160 MG TABLET    Take 1 tablet by mouth 2 (two) times daily.   TAMSULOSIN (FLOMAX) 0.4 MG CAPS CAPSULE    Take 0.4 mg by mouth daily.    UNABLE TO FIND    Med Name: Med pass 120 mL by mouth 3 times daily  Modified Medications   No medications on file  Discontinued Medications   No medications on file     Allergies  Allergen Reactions  . Hydrocodone-Acetaminophen Other (See Comments)    DELIRIUM, AGITATION, HYPERACTIVITY  . Seroquel [Quetiapine] Other (See Comments)    DELIRIUM, AGITATION, HYPERACTIVITY  . Primidone Other (See Comments)     REVIEW OF SYSTEMS:  GENERAL: no change in appetite, no fatigue, no weight changes, no fever, chills or weakness EYES: Denies change in vision, dry eyes, eye pain, itching or discharge EARS: Denies change in hearing, ringing in ears, or earache NOSE: Denies nasal congestion or epistaxis MOUTH and THROAT: Denies oral discomfort, gingival pain or bleeding, pain from teeth or hoarseness   RESPIRATORY: no cough, SOB, DOE, wheezing, hemoptysis CARDIAC: no chest pain, edema or palpitations GI: no abdominal pain, diarrhea, heart burn, nausea or vomiting, + constipation GU: Denies dysuria, frequency, hematuria, incontinence, or discharge PSYCHIATRIC: Denies feeling of depression or anxiety. No report of hallucinations, insomnia, paranoia, or agitation    PHYSICAL EXAMINATION  GENERAL APPEARANCE: Well nourished. In no acute distress. Normal body habitus SKIN:  Has wound vac attaches to left leg covered with ACE wrap HEAD: Normal in size and contour. No evidence of trauma EYES: Lids open and close normally. No blepharitis, entropion or ectropion. PERRL. Conjunctivae are clear and sclerae are white. Lenses are without opacity EARS: Pinnae are normal. Hard of hearing MOUTH and THROAT: Lips are without lesions. Oral mucosa is moist and without lesions. Tongue is normal in shape, size, and color  and without lesions NECK: supple, trachea midline, no neck masses, no thyroid tenderness, no thyromegaly LYMPHATICS: no LAN in the neck, no supraclavicular LAN RESPIRATORY: breathing is even & unlabored, BS CTAB CARDIAC: RRR, no murmur,no extra heart sounds, no edema GI: abdomen soft, normal BS, no masses, no tenderness, no hepatomegaly, no splenomegaly EXTREMITIES:  Able to move X 4 extremities PSYCHIATRIC: Alert and oriented X 3. Affect and behavior are appropriate  LABS/RADIOLOGY: Labs reviewed: Basic Metabolic Panel:  Recent Labs  05/07/16 0539 05/08/16 1000 05/09/16 0355 05/19/16 0347 05/22/16 1720 05/30/16  NA 136 140 139 132* 132* 138  K 3.3* 3.6 4.1 4.9 5.0 4.8  CL  102 109 106 100* 100*  --   CO2 22 24 25 25 23   --   GLUCOSE 140* 153* 117* 114* 123*  --   BUN 15 13 19  37* 26* 14  CREATININE 1.30* 0.88 0.79 1.01 1.13 0.9  CALCIUM 8.6* 8.2* 8.4* 8.6* 8.2*  --   MG 1.8 1.7  --   --   --   --    Liver Function Tests:  Recent Labs  06/30/15 0414 05/07/16 0539 05/08/16 1000 05/30/16  AST 36 48* 43* 28  ALT 21 28 25  35  ALKPHOS 39 50 40 138*  BILITOT 1.2 1.4* 1.4*  --   PROT 4.3* 6.0* 5.2*  --   ALBUMIN 2.2* 3.7 3.0*  --     Recent Labs  05/07/16 1629  AMMONIA 20   CBC:  Recent Labs  05/09/16 0355 05/19/16 0347 05/22/16 1720 05/30/16  WBC 9.2 7.6 8.7 5.4  NEUTROABS  --  6.1 7.0 4  HGB 9.7* 10.1* 9.8* 9.3*  HCT 28.3* 28.4* 28.5* 28*  MCV 98.6 97.9 97.6  --   PLT 109* 264 262 205   Cardiac Enzymes:  Recent Labs  06/25/15 0832 06/26/15 1837 06/26/15 2220 06/28/15 0630 05/07/16 0539  CKTOTAL 444* 1,042*  --  435* 325  TROPONINI 0.04* 0.07* 0.09*  --   --    CBG:  Recent Labs  05/06/16 1211 05/06/16 1714 05/07/16 1215  GLUCAP 113* 154* 148*      Dg Chest 2 View  Result Date: 05/22/2016 CLINICAL DATA:  Left lower leg hematoma for 2 weeks. Laceration with pain. EXAM: CHEST  2 VIEW COMPARISON:  06/26/2015. FINDINGS: Stable  cardiomegaly and aortic atherosclerosis. There is a probable hiatal hernia. Mild atelectasis is present at both lung bases. There is no confluent airspace opacity, edema or significant pleural effusion. Evidence of chronic rotator cuff tear on the right. A mid thoracic compression deformity appears grossly stable, not optimally seen on previous portable study. IMPRESSION: No definite acute findings. Cardiomegaly with mild bibasilar atelectasis. Electronically Signed   By: Richardean Sale M.D.   On: 05/22/2016 18:00   Dg Tibia/fibula Left  Result Date: 05/22/2016 CLINICAL DATA:  Deep left leg wound. EXAM: LEFT TIBIA AND FIBULA - 2 VIEW COMPARISON:  None. FINDINGS: Soft tissue defect/laceration noted in the lateral soft tissues. No underlying bony abnormality. No fracture, subluxation or dislocation. Vascular calcifications noted. No radiopaque foreign body. IMPRESSION: No acute bony abnormality. Electronically Signed   By: Rolm Baptise M.D.   On: 05/22/2016 17:59   Dg Tibia/fibula Left  Result Date: 05/19/2016 CLINICAL DATA:  Infection and bleeding of mid leg EXAM: LEFT TIBIA AND FIBULA - 2 VIEW COMPARISON:  Tibia/ fibula radiograph 05/05/2016. FINDINGS: There is no evidence of fracture or other focal bone lesions. Vascular calcifications are noted. There is persistent soft tissue swelling of the lateral aspect of the left leg. IMPRESSION: Persistent soft tissue swelling of the lateral aspect of the left leg without focal osseous abnormality. Electronically Signed   By: Ulyses Jarred M.D.   On: 05/19/2016 03:45    ASSESSMENT/PLAN:  Generalized weakness - for home health PT, OT, CNA, SW and Nursing, for therapeutic strengthening exercises; fall precautions  Traumatic open wound of left lower leg - S/P surgical debridement; follow-up with orthopedic surgeon; continue decubivite 1 tab by mouth daily  Anemia, acute blood loss - had hematoma S/P wound debridement; continue ferrous sulfate 325 mg 1 tab  by mouth daily Lab Results  Component  Value Date   HGB 9.3 (A) 05/30/2016   History of hip replacement with chronic infection - continue Bactrim DS 1 tab by mouth twice a day for prophylaxis  Allergic rhinitis - continue Zyrtec 10 mg 1 tab by mouth daily  BPH - continue Proscar 5 mg 1 tab by mouth daily  Hypertension - well controlled; continue metoprolol succinate ER 50 mg take 1 1/2 tab = 75 mg by mouth daily  Chronic constipation - DC senna S1 tab by mouth daily at bedtime and increase to senna S 2 tabs by mouth twice a day  Atrial fibrillation - rate controlled; continue metoprolol succinate ER 50 mg take 1 1/2 tab = 75 mg by mouth daily; off Eliquis due to a recent fall and hematoma  Severe protein calorie malnutrition - continue Remeron 15 mg by mouth daily at bedtime to increase appetite, Eldertonic 15 mL by mouth twice a day      I have filled out patient's discharge paperwork and written prescriptions.  Patient will receive home health PT, OT, SW, Nursing and CNA.  DME provided:  Semi-electric hospital bed  Total discharge time: Greater than 30 minutes Greater than 50% was spent in counseling and coordination of care with the patient.   Discharge time involved coordination of the discharge process with social worker, nursing staff and therapy department. Medical justification for home health services/DME verified.   Karelyn Brisby C. [place]  -  NP [employer] [phone]

## 2016-06-08 DIAGNOSIS — Z6820 Body mass index (BMI) 20.0-20.9, adult: Secondary | ICD-10-CM | POA: Diagnosis not present

## 2016-06-08 DIAGNOSIS — I480 Paroxysmal atrial fibrillation: Secondary | ICD-10-CM | POA: Diagnosis not present

## 2016-06-08 DIAGNOSIS — M90859 Osteopathy in diseases classified elsewhere, unspecified thigh: Secondary | ICD-10-CM | POA: Diagnosis not present

## 2016-06-08 DIAGNOSIS — D6489 Other specified anemias: Secondary | ICD-10-CM | POA: Diagnosis not present

## 2016-06-08 DIAGNOSIS — M7981 Nontraumatic hematoma of soft tissue: Secondary | ICD-10-CM | POA: Diagnosis not present

## 2016-06-14 DIAGNOSIS — T8189XA Other complications of procedures, not elsewhere classified, initial encounter: Secondary | ICD-10-CM | POA: Diagnosis not present

## 2016-06-17 ENCOUNTER — Ambulatory Visit (INDEPENDENT_AMBULATORY_CARE_PROVIDER_SITE_OTHER): Payer: Medicare Other | Admitting: Orthopaedic Surgery

## 2016-06-17 ENCOUNTER — Encounter (INDEPENDENT_AMBULATORY_CARE_PROVIDER_SITE_OTHER): Payer: Self-pay | Admitting: Orthopaedic Surgery

## 2016-06-17 DIAGNOSIS — S8012XA Contusion of left lower leg, initial encounter: Secondary | ICD-10-CM

## 2016-06-17 NOTE — Progress Notes (Signed)
Office Visit Note   Patient: Gerald Sandoval           Date of Birth: 07-Jul-1923           MRN: [id number] Visit Date: 06/17/2016              Requested by: Crist Infante, MD [address] [place], [place] [address] PCP: Jerlyn Ly, MD   Assessment & Plan: Visit Diagnoses:  1. Hematoma of leg, left, initial encounter     Plan: Plan is for split thickness skin grafting next Wednesday as an outpatient. We'll have patient follow-up in 5-7 days after the skin graft.  Follow-Up Instructions: Return for 5-7 day postop visit.   Orders:  No orders of the defined types were placed in this encounter.  No orders of the defined types were placed in this encounter.     Procedures: No procedures performed   Clinical Data: No additional findings.   Subjective: Chief Complaint  Patient presents with  . Left Leg - Pain    Patient comes back for wound check of his left lower extremity.    Review of Systems   Objective: Vital Signs: There were no vitals taken for this visit.  Physical Exam  Ortho Exam The wound is improving. There is great granulation tissue. Specialty Comments:  No specialty comments available.  Imaging: No results found.   PMFS History: Patient Active Problem List   Diagnosis Date Noted  . Protein-calorie malnutrition, severe 05/08/2016  . Acute encephalopathy 05/07/2016  . Hematoma of leg, left, initial encounter 05/06/2016  . Hyperglycemia 05/06/2016  . Chronic anemia 05/06/2016  . AKI (acute kidney injury) ([place]) 05/05/2016  . Persistent atrial fibrillation ([place])   . Chronic anticoagulation-Pradaxa prior to admission 06/28/2015  . RBBB 06/28/2015  . DVT of Rt lower limb, acute ([place]) 06/28/2015  . S/P IVC filter 06/27/15 06/28/2015  . S/P Lt hip replacement, chronically infected [facility]) 06/28/2015  . Lactic acidosis   . Encounter for palliative care   . Goals of care, counseling/discussion   . Fracture - Rt hip prosthesis this adm   .  Severe sepsis- found down at nursing home 06/25/2015  . Sepsis ([place]) 06/25/2015  . Edema of right lower extremity 06/25/2015  . History of right hip replacement 06/25/2015  . Hyperkalemia 06/25/2015  . Metabolic acidosis [id number]  . EMPHYSEMA 08/03/2009  . OSTEOARTHRITIS 08/03/2009  . ARTHRITIS 08/03/2009  . GASTRIC ULCER 06/02/2009  . GASTRITIS 06/02/2009  . COLONIC POLYPS 05/03/2009  . DYSPHAGIA UNSPECIFIED 05/03/2009   Past Medical History:  Diagnosis Date  . Abscess of left hip    infection left hip  . Anemia   . BPH (benign prostatic hyperplasia)   . Colon polyp   . Diastolic dysfunction   . Dysphagia    unspecified  . Emphysema   . Gastric ulcer   . Gastritis   . Hernia    Rt. Inguinial  . History of colonic polyps   . Hypogonadism in male   . Kidney stones   . Osteoarthritis   . Osteoporosis   . Permanent atrial fibrillation ([place])   . RBBB (right bundle branch block)   . Rotator cuff injury     Family History  Problem Relation Age of Onset  . Colon cancer Maternal Uncle   . Diabetes Maternal Aunt     Past Surgical History:  Procedure Laterality Date  . INGUINAL HERNIA REPAIR     RT  . ORIF PERIPROSTHETIC FRACTURE Right 06/29/2015  Procedure: OPEN REDUCTION INTERNAL FIXATION (ORIF) RIGHT HIP PERIPROSTHETIC FRACTURE;  Surgeon: Leandrew Koyanagi, MD;  Location: [facility];  Service: Orthopedics;  Laterality: Right;  . Resection of Ribs    . TOTAL HIP ARTHROPLASTY     Both hips; Left in 2004, Right in 2007  . TRANSURETHRAL RESECTION OF PROSTATE     Social History   Occupational History  . Retired: [employer]    Social History Main Topics  . Smoking status: Never Smoker  . Smokeless tobacco: Never Used  . Alcohol use No  . Drug use: No  . Sexual activity: Not on file

## 2016-06-19 ENCOUNTER — Other Ambulatory Visit (INDEPENDENT_AMBULATORY_CARE_PROVIDER_SITE_OTHER): Payer: Self-pay | Admitting: Orthopaedic Surgery

## 2016-06-19 DIAGNOSIS — S81802D Unspecified open wound, left lower leg, subsequent encounter: Secondary | ICD-10-CM

## 2016-06-25 ENCOUNTER — Encounter (HOSPITAL_COMMUNITY): Payer: Self-pay | Admitting: *Deleted

## 2016-06-25 NOTE — Progress Notes (Signed)
Pt Gerald Sandoval-pre-op call completed by both pt and daughter, Sonia Baller, with pt verbal consent (DPR). Pt denies SOB, chest pain, and being under the care of a cardiologist. Pt stated that Afib is currently being managed by PCP, Dr. Joylene Draft. Pt denies having a cardiac cath and stress test. Pt made aware to stop taking Aspirin ( not taking currently), vitamins, fish oil   and herbal medications. Do not take any NSAIDs ie: Ibuprofen, Advil, Naproxen, BC and Goody Powder. Daughter verbalized understanding of all pre-op instructions. Anesthesia asked to review pt cardiac history; see note.

## 2016-06-25 NOTE — Progress Notes (Signed)
Anesthesia Chart Review:  Pt is a same day work up.   Pt is a 83 year old male scheduled for L leg irrigation and debridement, split-thickness skin graft, wound VAC on 06/26/2016 with Frankey Shown, MD.   - PCP is Crist Infante, MD, who also manages pt's afib.  - Pt does not routinely see cardiology. Saw Lauree Chandler, MD with cardiology 06/2015 for pre-op eval for ORIF R hip, notes in Epic.   PMH includes:  Permanent atrial fibrillation, RBBB, anemia, emphysema.  Never smoker. BMI 20. S/p ORIF R hip periprosthetic fracture 06/29/15.   - Pt hospitalized 12/10-12/14/17 for large left leg hematoma, fall, delirium, AKI, acute encephalopathy. Eliquis for afib was stopped due to fall/hematoma.   Medications include: Iron, metoprolol.  Labs will be obtained DOS. H/H 9.3/28 on 05/30/16.   CXR 05/22/16: No definite acute findings. Cardiomegaly with mild bibasilar atelectasis.  EKG 05/05/16: Atrial fibrillation (130 bpm). RBBB.   Echo 06/28/15:  - Left ventricle: The cavity size was normal. Wall thickness was increased in a pattern of mild LVH. Systolic function was normal. The estimated ejection fraction was in the range of 50% to 55%. Diffuse hypokinesis. - Aortic valve: Trileaflet; mildly thickened, mildly calcified leaflets. There was trivial regurgitation. - Mitral valve: Mild prolapse, involving the posterior leaflet. There was moderate regurgitation. - Left atrium: The atrium was mildly dilated. - Right ventricle: The cavity size was mildly dilated. Wall thickness was normal. - Right atrium: The atrium was mildly dilated. - Tricuspid valve: There was moderate regurgitation. - Pulmonary arteries: Systolic pressure was mildly increased. PA peak pressure: 43 mm Hg (S).  If labs acceptable DOS, I anticipate patient can proceed as scheduled.  Willeen Cass, FNP-BC [facility]/Anesthesiology Phone: [phone] 06/25/2016 2:38 PM

## 2016-06-26 ENCOUNTER — Ambulatory Visit (HOSPITAL_COMMUNITY): Payer: Medicare Other | Admitting: Emergency Medicine

## 2016-06-26 ENCOUNTER — Encounter (HOSPITAL_COMMUNITY)
Admission: RE | Disposition: A | Discharge status: Home / Self Care | Payer: Self-pay | Source: Ambulatory Visit | Attending: Orthopaedic Surgery

## 2016-06-26 ENCOUNTER — Ambulatory Visit (HOSPITAL_COMMUNITY)
Admission: RE | Admit: 2016-06-26 | Discharge: 2016-06-26 | Disposition: A | Discharge status: Home / Self Care | Payer: Medicare Other | Source: Ambulatory Visit | Attending: Orthopaedic Surgery | Admitting: Orthopaedic Surgery

## 2016-06-26 ENCOUNTER — Encounter (HOSPITAL_COMMUNITY): Payer: Self-pay | Admitting: *Deleted

## 2016-06-26 DIAGNOSIS — M1990 Unspecified osteoarthritis, unspecified site: Secondary | ICD-10-CM | POA: Diagnosis not present

## 2016-06-26 DIAGNOSIS — Z86010 Personal history of colonic polyps: Secondary | ICD-10-CM | POA: Insufficient documentation

## 2016-06-26 DIAGNOSIS — X58XXXA Exposure to other specified factors, initial encounter: Secondary | ICD-10-CM | POA: Diagnosis not present

## 2016-06-26 DIAGNOSIS — Z885 Allergy status to narcotic agent status: Secondary | ICD-10-CM | POA: Insufficient documentation

## 2016-06-26 DIAGNOSIS — I482 Chronic atrial fibrillation: Secondary | ICD-10-CM | POA: Diagnosis not present

## 2016-06-26 DIAGNOSIS — Z9889 Other specified postprocedural states: Secondary | ICD-10-CM | POA: Diagnosis not present

## 2016-06-26 DIAGNOSIS — S81802A Unspecified open wound, left lower leg, initial encounter: Secondary | ICD-10-CM

## 2016-06-26 DIAGNOSIS — Z8719 Personal history of other diseases of the digestive system: Secondary | ICD-10-CM | POA: Diagnosis not present

## 2016-06-26 DIAGNOSIS — I739 Peripheral vascular disease, unspecified: Secondary | ICD-10-CM | POA: Diagnosis not present

## 2016-06-26 DIAGNOSIS — S81802D Unspecified open wound, left lower leg, subsequent encounter: Secondary | ICD-10-CM

## 2016-06-26 DIAGNOSIS — D649 Anemia, unspecified: Secondary | ICD-10-CM | POA: Insufficient documentation

## 2016-06-26 DIAGNOSIS — Z79899 Other long term (current) drug therapy: Secondary | ICD-10-CM | POA: Diagnosis not present

## 2016-06-26 DIAGNOSIS — J439 Emphysema, unspecified: Secondary | ICD-10-CM | POA: Diagnosis not present

## 2016-06-26 DIAGNOSIS — J449 Chronic obstructive pulmonary disease, unspecified: Secondary | ICD-10-CM | POA: Diagnosis not present

## 2016-06-26 DIAGNOSIS — I10 Essential (primary) hypertension: Secondary | ICD-10-CM | POA: Diagnosis not present

## 2016-06-26 DIAGNOSIS — Z792 Long term (current) use of antibiotics: Secondary | ICD-10-CM | POA: Diagnosis not present

## 2016-06-26 DIAGNOSIS — Z96643 Presence of artificial hip joint, bilateral: Secondary | ICD-10-CM | POA: Diagnosis not present

## 2016-06-26 DIAGNOSIS — I481 Persistent atrial fibrillation: Secondary | ICD-10-CM | POA: Diagnosis not present

## 2016-06-26 DIAGNOSIS — I4510 Unspecified right bundle-branch block: Secondary | ICD-10-CM | POA: Diagnosis not present

## 2016-06-26 DIAGNOSIS — N400 Enlarged prostate without lower urinary tract symptoms: Secondary | ICD-10-CM | POA: Insufficient documentation

## 2016-06-26 DIAGNOSIS — Z87442 Personal history of urinary calculi: Secondary | ICD-10-CM | POA: Insufficient documentation

## 2016-06-26 DIAGNOSIS — Z7901 Long term (current) use of anticoagulants: Secondary | ICD-10-CM | POA: Insufficient documentation

## 2016-06-26 DIAGNOSIS — Z888 Allergy status to other drugs, medicaments and biological substances status: Secondary | ICD-10-CM | POA: Insufficient documentation

## 2016-06-26 HISTORY — PX: SKIN SPLIT GRAFT: SHX444

## 2016-06-26 HISTORY — PX: I & D EXTREMITY: SHX5045

## 2016-06-26 HISTORY — DX: Unspecified open wound, left lower leg, initial encounter: S81.802A

## 2016-06-26 LAB — CBC
HEMATOCRIT: 30.6 % — AB (ref 39.0–52.0)
Hemoglobin: 10.2 g/dL — ABNORMAL LOW (ref 13.0–17.0)
MCH: 32.9 pg (ref 26.0–34.0)
MCHC: 33.3 g/dL (ref 30.0–36.0)
MCV: 98.7 fL (ref 78.0–100.0)
Platelets: 139 10*3/uL — ABNORMAL LOW (ref 150–400)
RBC: 3.1 MIL/uL — ABNORMAL LOW (ref 4.22–5.81)
RDW: 15 % (ref 11.5–15.5)
WBC: 4.8 10*3/uL (ref 4.0–10.5)

## 2016-06-26 LAB — PROTIME-INR
INR: 1.12
PROTHROMBIN TIME: 14.5 s (ref 11.4–15.2)

## 2016-06-26 SURGERY — IRRIGATION AND DEBRIDEMENT EXTREMITY
Anesthesia: General | Site: Leg Lower | Laterality: Left

## 2016-06-26 MED ORDER — ARTIFICIAL TEARS OP OINT
TOPICAL_OINTMENT | OPHTHALMIC | Status: AC
  Filled 2016-06-26: qty 3.5

## 2016-06-26 MED ORDER — FENTANYL CITRATE (PF) 100 MCG/2ML IJ SOLN: 25.0000 ug | INTRAMUSCULAR | Status: DC | PRN

## 2016-06-26 MED ORDER — ARTIFICIAL TEARS OP OINT
TOPICAL_OINTMENT | OPHTHALMIC | Status: DC | PRN
  Administered 2016-06-26: 1 via OPHTHALMIC

## 2016-06-26 MED ORDER — PROPOFOL 10 MG/ML IV BOLUS
INTRAVENOUS | Status: AC
  Filled 2016-06-26: qty 20

## 2016-06-26 MED ORDER — PROPOFOL 10 MG/ML IV BOLUS
INTRAVENOUS | Status: DC | PRN
  Administered 2016-06-26: 30 mg via INTRAVENOUS
  Administered 2016-06-26: 120 mg via INTRAVENOUS

## 2016-06-26 MED ORDER — TRAMADOL HCL 50 MG PO TABS: 50.0000 mg | ORAL_TABLET | Freq: Four times a day (QID) | ORAL | 0 refills | Status: DC | PRN

## 2016-06-26 MED ORDER — ONDANSETRON HCL 4 MG/2ML IJ SOLN
INTRAMUSCULAR | Status: DC | PRN
  Administered 2016-06-26: 4 mg via INTRAVENOUS

## 2016-06-26 MED ORDER — PHENYLEPHRINE 40 MCG/ML (10ML) SYRINGE FOR IV PUSH (FOR BLOOD PRESSURE SUPPORT)
PREFILLED_SYRINGE | INTRAVENOUS | Status: AC
  Filled 2016-06-26: qty 10

## 2016-06-26 MED ORDER — METOPROLOL TARTRATE 5 MG/5ML IV SOLN
2.0000 mg | Freq: Once | INTRAVENOUS | Status: AC
  Administered 2016-06-26: 2 mg via INTRAVENOUS

## 2016-06-26 MED ORDER — FENTANYL CITRATE (PF) 100 MCG/2ML IJ SOLN
INTRAMUSCULAR | Status: AC
  Filled 2016-06-26: qty 2

## 2016-06-26 MED ORDER — SODIUM CHLORIDE 0.9 % IR SOLN
Status: DC | PRN
  Administered 2016-06-26: 1000 mL

## 2016-06-26 MED ORDER — PHENYLEPHRINE HCL 10 MG/ML IJ SOLN
INTRAMUSCULAR | Status: DC | PRN
  Administered 2016-06-26: 80 ug via INTRAVENOUS
  Administered 2016-06-26: 160 ug via INTRAVENOUS
  Administered 2016-06-26 (×2): 120 ug via INTRAVENOUS
  Administered 2016-06-26 (×2): 160 ug via INTRAVENOUS

## 2016-06-26 MED ORDER — EPINEPHRINE PF 1 MG/ML IJ SOLN
INTRAMUSCULAR | Status: DC | PRN
  Administered 2016-06-26: 1 mg via SUBCUTANEOUS

## 2016-06-26 MED ORDER — GLYCOPYRROLATE 0.2 MG/ML IJ SOLN
INTRAMUSCULAR | Status: DC | PRN
  Administered 2016-06-26: 0.1 mg via INTRAVENOUS

## 2016-06-26 MED ORDER — LACTATED RINGERS IV SOLN
INTRAVENOUS | Status: DC
  Administered 2016-06-26 (×2): via INTRAVENOUS

## 2016-06-26 MED ORDER — MINERAL OIL LIGHT 100 % EX OIL
TOPICAL_OIL | CUTANEOUS | Status: DC | PRN
  Administered 2016-06-26: 1 via TOPICAL

## 2016-06-26 MED ORDER — LIDOCAINE HCL (CARDIAC) 20 MG/ML IV SOLN
INTRAVENOUS | Status: DC | PRN
  Administered 2016-06-26: 60 mg via INTRATRACHEAL

## 2016-06-26 MED ORDER — ONDANSETRON HCL 4 MG/2ML IJ SOLN
INTRAMUSCULAR | Status: AC
  Filled 2016-06-26: qty 2

## 2016-06-26 MED ORDER — METOPROLOL TARTRATE 5 MG/5ML IV SOLN
INTRAVENOUS | Status: AC
  Administered 2016-06-26: 2 mg via INTRAVENOUS
  Filled 2016-06-26: qty 5

## 2016-06-26 MED ORDER — LIDOCAINE 2% (20 MG/ML) 5 ML SYRINGE
INTRAMUSCULAR | Status: AC
  Filled 2016-06-26: qty 5

## 2016-06-26 MED ORDER — PHENYLEPHRINE HCL 10 MG/ML IJ SOLN
INTRAVENOUS | Status: DC | PRN
  Administered 2016-06-26: 50 ug/min via INTRAVENOUS

## 2016-06-26 MED ORDER — FENTANYL CITRATE (PF) 100 MCG/2ML IJ SOLN
INTRAMUSCULAR | Status: DC | PRN
Start: 2016-06-26 — End: 2016-06-26
  Administered 2016-06-26 (×2): 25 ug via INTRAVENOUS

## 2016-06-26 MED ORDER — CEFAZOLIN SODIUM-DEXTROSE 2-4 GM/100ML-% IV SOLN
2.0000 g | INTRAVENOUS | Status: AC
  Administered 2016-06-26: 2 g via INTRAVENOUS

## 2016-06-26 MED ORDER — CEFAZOLIN SODIUM-DEXTROSE 2-4 GM/100ML-% IV SOLN
INTRAVENOUS | Status: AC
  Filled 2016-06-26: qty 100

## 2016-06-26 SURGICAL SUPPLY — 38 items
BANDAGE ACE 6X5 VEL STRL LF (GAUZE/BANDAGES/DRESSINGS) ×2 IMPLANT
BLADE DERMATOME SS (BLADE) ×2 IMPLANT
BNDG COHESIVE 6X5 TAN STRL LF (GAUZE/BANDAGES/DRESSINGS) ×2 IMPLANT
BNDG GAUZE ELAST 4 BULKY (GAUZE/BANDAGES/DRESSINGS) ×2 IMPLANT
COVER SURGICAL LIGHT HANDLE (MISCELLANEOUS) ×2 IMPLANT
DERMACARRIERS GRAFT 1 TO 1.5 (DISPOSABLE) ×2
DRAPE INCISE IOBAN 66X45 STRL (DRAPES) ×2 IMPLANT
DRAPE U-SHAPE 47X51 STRL (DRAPES) ×2 IMPLANT
DRSG ADAPTIC 3X8 NADH LF (GAUZE/BANDAGES/DRESSINGS) ×4 IMPLANT
DRSG PAD ABDOMINAL 8X10 ST (GAUZE/BANDAGES/DRESSINGS) IMPLANT
DRSG TELFA 3X8 NADH (GAUZE/BANDAGES/DRESSINGS) ×2 IMPLANT
DRSG VAC ATS MED SENSATRAC (GAUZE/BANDAGES/DRESSINGS) ×2 IMPLANT
ELECT REM PT RETURN 9FT ADLT (ELECTROSURGICAL)
ELECTRODE REM PT RTRN 9FT ADLT (ELECTROSURGICAL) IMPLANT
GAUZE XEROFORM 5X9 LF (GAUZE/BANDAGES/DRESSINGS) ×2 IMPLANT
GLOVE SKINSENSE NS SZ7.5 (GLOVE) ×2
GLOVE SKINSENSE STRL SZ7.5 (GLOVE) ×2 IMPLANT
GOWN STRL REIN XL XLG (GOWN DISPOSABLE) ×2 IMPLANT
GRAFT DERMACARRIERS 1 TO 1.5 (DISPOSABLE) ×1 IMPLANT
HANDPIECE INTERPULSE COAX TIP (DISPOSABLE)
KIT BASIN OR (CUSTOM PROCEDURE TRAY) ×2 IMPLANT
KIT ROOM TURNOVER OR (KITS) ×2 IMPLANT
MANIFOLD NEPTUNE II (INSTRUMENTS) ×2 IMPLANT
NS IRRIG 1000ML POUR BTL (IV SOLUTION) ×2 IMPLANT
PACK ORTHO EXTREMITY (CUSTOM PROCEDURE TRAY) ×2 IMPLANT
PAD ARMBOARD 7.5X6 YLW CONV (MISCELLANEOUS) ×4 IMPLANT
PADDING CAST ABS 4INX4YD NS (CAST SUPPLIES) ×1
PADDING CAST ABS COTTON 4X4 ST (CAST SUPPLIES) ×1 IMPLANT
PADDING CAST COTTON 6X4 STRL (CAST SUPPLIES) ×2 IMPLANT
SET HNDPC FAN SPRY TIP SCT (DISPOSABLE) IMPLANT
SPONGE LAP 18X18 X RAY DECT (DISPOSABLE) ×2 IMPLANT
STAPLER VISISTAT 35W (STAPLE) IMPLANT
TOWEL OR 17X24 6PK STRL BLUE (TOWEL DISPOSABLE) ×2 IMPLANT
TOWEL OR 17X26 10 PK STRL BLUE (TOWEL DISPOSABLE) ×2 IMPLANT
TUBE CONNECTING 12X1/4 (SUCTIONS) ×2 IMPLANT
TUBING CYSTO DISP (UROLOGICAL SUPPLIES) ×2 IMPLANT
UNDERPAD 30X30 (UNDERPADS AND DIAPERS) ×4 IMPLANT
YANKAUER SUCT BULB TIP NO VENT (SUCTIONS) ×2 IMPLANT

## 2016-06-26 NOTE — Discharge Instructions (Signed)
1. Do not change wound vac 2. Change dressings to left thigh donor site daily with vaseline ointment and dry dressing

## 2016-06-26 NOTE — Anesthesia Procedure Notes (Signed)
Procedure Name: Intubation Date/Time: 06/26/2016 11:21 AM Performed by: Suzette Battiest Pre-anesthesia Checklist: Patient identified, Emergency Drugs available, Suction available and Patient being monitored Patient Re-evaluated:Patient Re-evaluated prior to inductionOxygen Delivery Method: Circle system utilized Preoxygenation: Pre-oxygenation with 100% oxygen Intubation Type: IV induction Ventilation: Mask ventilation without difficulty LMA: LMA inserted LMA Size: 4.0 Tube type: Oral Number of attempts: 1 Placement Confirmation: breath sounds checked- equal and bilateral and positive ETCO2 Tube secured with: Tape Dental Injury: Teeth and Oropharynx as per pre-operative assessment

## 2016-06-26 NOTE — Transfer of Care (Signed)
Immediate Anesthesia Transfer of Care Note  Patient: Gerald Sandoval.  Procedure(s) Performed: Procedure(s): LEFT LEG IRRIGATION AND DEBRIDEMENT, SPLIT THICKNESS SKIN GRAFT, WOUND VAC (Left) SKIN GRAFT SPLIT THICKNESS (Left)  Patient Location: PACU  Anesthesia Type:General  Level of Consciousness: awake, alert , patient cooperative and confused  Airway & Oxygen Therapy: Patient Spontanous Breathing and Patient connected to face mask oxygen  Post-op Assessment: Report given to RN and Post -op Vital signs reviewed and stable  Post vital signs: Reviewed 130/85, 115, 18, 100% Last Vitals:  Vitals:   06/26/16 0841 06/26/16 0843  BP: 117/73   Pulse:  83  Resp: 16   Temp: 36.5 C     Last Pain: There were no vitals filed for this visit.    Patients Stated Pain Goal: 4 ([id number])  Complications: No apparent anesthesia complications

## 2016-06-26 NOTE — H&P (Signed)
PREOPERATIVE H&P  Chief Complaint: left leg wound  HPI: Gerald Sandoval. is a 83 y.o. male who presents for surgical treatment of left leg wound.  He denies any changes in medical history.  Past Medical History:  Diagnosis Date  . Abscess of left hip    infection left hip  . Anemia   . BPH (benign prostatic hyperplasia)   . Colon polyp   . Diastolic dysfunction   . Dysphagia    unspecified  . Emphysema   . Gastric ulcer   . Gastritis   . Hernia    Rt. Inguinial  . History of colonic polyps   . Hypogonadism in male   . Kidney stones   . Leg wound, left   . Osteoarthritis   . Osteoporosis   . Permanent atrial fibrillation ([place])   . RBBB (right bundle branch block)   . Rotator cuff injury    Past Surgical History:  Procedure Laterality Date  . INGUINAL HERNIA REPAIR     RT  . IRRIGATION AND DEBRIDEMENT HEMATOMA     Left leg  . ORIF PERIPROSTHETIC FRACTURE Right 06/29/2015   Procedure: OPEN REDUCTION INTERNAL FIXATION (ORIF) RIGHT HIP PERIPROSTHETIC FRACTURE;  Surgeon: Leandrew Koyanagi, MD;  Location: [facility];  Service: Orthopedics;  Laterality: Right;  . Resection of Ribs    . TOTAL HIP ARTHROPLASTY     Both hips; Left in 2004, Right in 2007  . TRANSURETHRAL RESECTION OF PROSTATE     Social History   Social History  . Marital status: Single    Spouse name: N/A  . Number of children: 0  . Years of education: N/A   Occupational History  . Retired: [employer]    Social History Main Topics  . Smoking status: Never Smoker  . Smokeless tobacco: Never Used  . Alcohol use No  . Drug use: No  . Sexual activity: Not Asked   Other Topics Concern  . None   Social History Narrative   Patient lives in [place] alone.   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   His closest living relative is his sister who is 58 and lives in nursing home.   No caffeine   Retired [employer]   Family History  Problem Relation  Age of Onset  . Colon cancer Maternal Uncle   . Diabetes Maternal Aunt    Allergies  Allergen Reactions  . Hydrocodone-Acetaminophen Other (See Comments)    DELIRIUM, AGITATION, HYPERACTIVITY  . Seroquel [Quetiapine] Other (See Comments)    DELIRIUM, AGITATION, HYPERACTIVITY  . Primidone Other (See Comments)   Prior to Admission medications   Medication Sig Start Date End Date Taking? Authorizing Provider  cetirizine (ZYRTEC) 10 MG tablet Take 10 mg by mouth daily.    Yes Historical Provider, MD  Cyanocobalamin (VITAMIN B-12) 2500 MCG SUBL Place 2,500 mcg under the tongue 2 (two) times daily.    Yes Historical Provider, MD  ferrous sulfate (IRON SUPPLEMENT) 325 (65 FE) MG tablet Take 325 mg by mouth daily with breakfast.     Yes Historical Provider, MD  finasteride (PROSCAR) 5 MG tablet Take 5 mg by mouth daily.    Yes Historical Provider, MD  metoprolol (TOPROL-XL) 50 MG 24 hr tablet Take 1.5 tablets (75 mg total) by mouth daily. 04/15/11  Yes Thompson Grayer, MD  mirtazapine (REMERON) 15 MG tablet Take 15 mg by mouth at bedtime.  Yes Historical Provider, MD  senna-docusate (SENOKOT-S) 8.6-50 MG tablet Take 1 tablet by mouth at bedtime. 07/03/15  Yes Thurnell Lose, MD  sulfamethoxazole-trimethoprim (BACTRIM DS,SEPTRA DS) 800-160 MG tablet Take 1 tablet by mouth 2 (two) times daily.   Yes Historical Provider, MD  tamsulosin (FLOMAX) 0.4 MG CAPS capsule Take 0.4 mg by mouth daily.    Yes Historical Provider, MD  apixaban (ELIQUIS) 2.5 MG TABS tablet Take 2.5 mg by mouth 2 (two) times daily.    Historical Provider, MD     Positive ROS: All other systems have been reviewed and were otherwise negative with the exception of those mentioned in the HPI and as above.  Physical Exam: General: Alert, no acute distress Cardiovascular: No pedal edema Respiratory: No cyanosis, no use of accessory musculature GI: abdomen soft Skin: No lesions in the area of chief complaint Neurologic: Sensation  intact distally Psychiatric: Patient is competent for consent with normal mood and affect Lymphatic: no lymphedema  MUSCULOSKELETAL: exam stable  Assessment: left leg wound  Plan: Plan for Procedure(s): LEFT LEG IRRIGATION AND DEBRIDEMENT, SPLIT THICKNESS SKIN GRAFT, WOUND VAC SKIN GRAFT SPLIT THICKNESS  The risks benefits and alternatives were discussed with the patient including but not limited to the risks of nonoperative treatment, versus surgical intervention including infection, bleeding, nerve injury,  blood clots, cardiopulmonary complications, morbidity, mortality, among others, and they were willing to proceed.   Eduard Roux, MD   06/26/2016 10:32 AM

## 2016-06-26 NOTE — Anesthesia Postprocedure Evaluation (Signed)
Anesthesia Post Note  Patient: Gerald Sandoval.  Procedure(s) Performed: Procedure(s) (LRB): LEFT LEG IRRIGATION AND DEBRIDEMENT, SPLIT THICKNESS SKIN GRAFT, WOUND VAC (Left) SKIN GRAFT SPLIT THICKNESS (Left)  Patient location during evaluation: PACU Anesthesia Type: General Level of consciousness: awake and alert Pain management: pain level controlled Vital Signs Assessment: post-procedure vital signs reviewed and stable Respiratory status: spontaneous breathing, nonlabored ventilation, respiratory function stable and patient connected to nasal cannula oxygen Cardiovascular status: blood pressure returned to baseline and stable Postop Assessment: no signs of nausea or vomiting Anesthetic complications: no       Last Vitals:  Vitals:   06/26/16 1500 06/26/16 1522  BP: 115/68 118/88  Pulse: (!) 43 68  Resp: 17 20  Temp:      Last Pain:  Vitals:   06/26/16 1215  PainSc: Asleep                 Tiajuana Amass

## 2016-06-26 NOTE — Anesthesia Preprocedure Evaluation (Addendum)
Anesthesia Evaluation  Patient identified by MRN, date of birth, ID band Patient awake    Reviewed: Allergy & Precautions, NPO status , Patient's Chart, lab work & pertinent test results  Airway Mallampati: III  TM Distance: >3 FB Neck ROM: Full    Dental  (+) Edentulous Upper, Edentulous Lower   Pulmonary COPD,    breath sounds clear to auscultation       Cardiovascular hypertension, Pt. on medications and Pt. on home beta blockers + Peripheral Vascular Disease  + dysrhythmias Atrial Fibrillation  Rhythm:Regular Rate:Normal     Neuro/Psych negative neurological ROS     GI/Hepatic Neg liver ROS, PUD,   Endo/Other  negative endocrine ROS  Renal/GU Renal disease     Musculoskeletal  (+) Arthritis ,   Abdominal   Peds  Hematology  (+) anemia ,   Anesthesia Other Findings   Reproductive/Obstetrics                            Lab Results  Component Value Date   WBC 4.8 06/26/2016   HGB 10.2 (L) 06/26/2016   HCT 30.6 (L) 06/26/2016   MCV 98.7 06/26/2016   PLT 139 (L) 06/26/2016   Lab Results  Component Value Date   CREATININE 0.9 05/30/2016   BUN 14 05/30/2016   NA 138 05/30/2016   K 4.8 05/30/2016   CL 100 (L) 05/22/2016   CO2 23 05/22/2016    Anesthesia Physical Anesthesia Plan  ASA: III  Anesthesia Plan: General   Post-op Pain Management:    Induction: Intravenous  Airway Management Planned: LMA  Additional Equipment:   Intra-op Plan:   Post-operative Plan: Extubation in OR  Informed Consent: I have reviewed the patients History and Physical, chart, labs and discussed the procedure including the risks, benefits and alternatives for the proposed anesthesia with the patient or authorized representative who has indicated his/her understanding and acceptance.   Dental advisory given  Plan Discussed with: CRNA  Anesthesia Plan Comments:         Anesthesia  Quick Evaluation

## 2016-06-26 NOTE — Op Note (Signed)
   Date of Surgery: 06/26/2016  INDICATIONS: Mr. Urben is a 83 y.o.-year-old male with a left lower leg wound;  The patient did consent to the procedure after discussion of the risks and benefits.  PREOPERATIVE DIAGNOSIS: Left lower leg wound 10 x 5 cm  POSTOPERATIVE DIAGNOSIS: Same.  PROCEDURE:  1. Preparation of recipient skin graft site of left lower extremity, 10 x 5 cm 2. Split thickness skin graft of left lower leg, 10 x 5 cm 3. Application wound VAC 50 cm  SURGEON: N. Eduard Roux, M.D.  ASSIST: Loni Muse, PA student.  ANESTHESIA:  general  IV FLUIDS AND URINE: See anesthesia.  ESTIMATED BLOOD LOSS: Minimal  mL.  IMPLANTS: None   DRAINS: Wound VAC   COMPLICATIONS: None.  DESCRIPTION OF PROCEDURE: The patient was brought to the operating room and placed supine  on the operating table.  The patient had been signed prior to the procedure and this was documented. The patient had the anesthesia placed by the anesthesiologist.  A time-out was performed to confirm that this was the correct patient, site, side and location. The patient did receive antibiotics prior to the incision and was re-dosed during the procedure as needed at indicated intervals.  A tourniquet was not  placed.  The patient had the operative extremity prepped and draped in the standard surgical fashion.    We first performed surgical preparation of the recipient site using #10 blade to gently debride the wound bed. The entire wound bed demonstrated good beefy red granulation tissue. This was then thoroughly irrigated with normal saline. I then took a split-thickness skin graft from the left thigh using a dermatome. This was then meshed 1.5:1 with a mesher. The skin graft was then applied to the wound and affixed with staples. Adaptic was placed on top of the skin graft and a wound VAC was then placed on top of that with a good seal. The donor site was dressed with Xeroform and dry dressing. Patient tolerated the  procedure well and no immediate complications   POSTOPERATIVE PLAN: Patient will return to the office next Tuesday for removal of the wound VAC and recheck of the skin graft.   Azucena Cecil, MD [place] 12:30 PM

## 2016-06-27 ENCOUNTER — Emergency Department (HOSPITAL_COMMUNITY): Payer: Medicare Other

## 2016-06-27 ENCOUNTER — Encounter (HOSPITAL_COMMUNITY): Payer: Self-pay | Admitting: Orthopaedic Surgery

## 2016-06-27 ENCOUNTER — Emergency Department (HOSPITAL_COMMUNITY)
Admission: EM | Admit: 2016-06-27 | Discharge: 2016-06-28 | Disposition: A | Discharge status: Home / Self Care | Payer: Medicare Other | Attending: Emergency Medicine | Admitting: Emergency Medicine

## 2016-06-27 DIAGNOSIS — K625 Hemorrhage of anus and rectum: Secondary | ICD-10-CM | POA: Diagnosis not present

## 2016-06-27 DIAGNOSIS — K5641 Fecal impaction: Secondary | ICD-10-CM

## 2016-06-27 DIAGNOSIS — K5900 Constipation, unspecified: Secondary | ICD-10-CM

## 2016-06-27 DIAGNOSIS — Z96642 Presence of left artificial hip joint: Secondary | ICD-10-CM | POA: Insufficient documentation

## 2016-06-27 DIAGNOSIS — Z7901 Long term (current) use of anticoagulants: Secondary | ICD-10-CM | POA: Diagnosis not present

## 2016-06-27 DIAGNOSIS — K6289 Other specified diseases of anus and rectum: Secondary | ICD-10-CM | POA: Diagnosis not present

## 2016-06-27 LAB — CBC WITH DIFFERENTIAL/PLATELET
BASOS ABS: 0 10*3/uL (ref 0.0–0.1)
Basophils Relative: 1 %
EOS PCT: 4 %
Eosinophils Absolute: 0.2 10*3/uL (ref 0.0–0.7)
HCT: 31.5 % — ABNORMAL LOW (ref 39.0–52.0)
HEMOGLOBIN: 10.3 g/dL — AB (ref 13.0–17.0)
LYMPHS ABS: 1.3 10*3/uL (ref 0.7–4.0)
LYMPHS PCT: 23 %
MCH: 31.6 pg (ref 26.0–34.0)
MCHC: 32.7 g/dL (ref 30.0–36.0)
MCV: 96.6 fL (ref 78.0–100.0)
Monocytes Absolute: 0.6 10*3/uL (ref 0.1–1.0)
Monocytes Relative: 10 %
Neutro Abs: 3.7 10*3/uL (ref 1.7–7.7)
Neutrophils Relative %: 62 %
PLATELETS: 161 10*3/uL (ref 150–400)
RBC: 3.26 MIL/uL — AB (ref 4.22–5.81)
RDW: 14.7 % (ref 11.5–15.5)
WBC: 5.9 10*3/uL (ref 4.0–10.5)

## 2016-06-27 LAB — BASIC METABOLIC PANEL
ANION GAP: 7 (ref 5–15)
BUN: 12 mg/dL (ref 6–20)
CHLORIDE: 100 mmol/L — AB (ref 101–111)
CO2: 26 mmol/L (ref 22–32)
Calcium: 8.6 mg/dL — ABNORMAL LOW (ref 8.9–10.3)
Creatinine, Ser: 0.93 mg/dL (ref 0.61–1.24)
GFR calc Af Amer: >60 mL/min (ref >60)
Glucose, Bld: 119 mg/dL — ABNORMAL HIGH (ref 65–99)
POTASSIUM: 4.7 mmol/L (ref 3.5–5.1)
SODIUM: 133 mmol/L — AB (ref 135–145)

## 2016-06-27 LAB — URINALYSIS, ROUTINE W REFLEX MICROSCOPIC
Bilirubin Urine: NEGATIVE
Glucose, UA: NEGATIVE mg/dL
HGB URINE DIPSTICK: NEGATIVE
Ketones, ur: NEGATIVE mg/dL
LEUKOCYTES UA: NEGATIVE
Nitrite: NEGATIVE
PROTEIN: NEGATIVE mg/dL
SPECIFIC GRAVITY, URINE: 1.011 (ref 1.005–1.030)
pH: 7 (ref 5.0–8.0)

## 2016-06-27 MED ORDER — POLYETHYLENE GLYCOL 3350 17 G PO PACK: 17.0000 g | PACK | Freq: Every day | ORAL | 0 refills | Status: DC

## 2016-06-27 MED ORDER — LIDOCAINE HCL 2 % EX GEL
1.0000 "application " | Freq: Once | CUTANEOUS | Status: AC
  Administered 2016-06-27: 1
  Filled 2016-06-27: qty 11

## 2016-06-27 NOTE — ED Notes (Signed)
Bed: [facility] Expected date:  Expected time:  Means of arrival:  Comments: 83 yo M  Constipation

## 2016-06-27 NOTE — Discharge Instructions (Addendum)
Read the information below.  Use the prescribed medication as directed.  Please discuss all new medications with your pharmacist.  You may return to the Emergency Department at any time for worsening condition or any new symptoms that concern you.     If you develop high fevers, worsening abdominal pain, uncontrolled vomiting, or are unable to tolerate fluids by mouth, return to the ER for a recheck.    We suspect that you have had difficulty urinating because you recently had general anesthesia and you are constipated.  Please use the constipation medications prescribed.  If you are unable to urinate tomorrow please return immediately to the Emergency Department to have a foley catheter placed in your bladder to drain your bladder.

## 2016-06-27 NOTE — ED Notes (Signed)
Patient transported to X-ray 

## 2016-06-27 NOTE — ED Triage Notes (Addendum)
Pt from [facility] with complaints of constipation and slight rectal bleed x 3 days. Pt denies abdominal pain or distention. Pt has audible bowel sounds. Pt was seen yesterday with c/o urinary retention and had his bladder drained. Pt states he has had some hematuria today.   Pt has a wound vac in his left leg due to a skin graft he has.

## 2016-06-27 NOTE — ED Provider Notes (Signed)
Constipation- impaction (disimpacted in ED), getting enema  I&O cath for UA, ?retention, bladder can here 340  UA pending Plan: treat UTI if any; patient to return with any further retention.   Re-evaluation: the patient was given an enema that did not produce a bowel movement. UA is negative for infection. Discussed discharge home with the patient as per plan of previous treatment team. The patient reports he cannot go home as he does not feel he can care for himself. I asked if he felt he needed to be placed in a nursing home and he answers "I don't know what to tell you".   12:45 - Discussed with Dr. Roxanne Mins. GoLytely ordered. Will observe for a period of time for bowel movement and reassess for final disposition.  2:30 - Patient still drinking GoLytely. Constipation is not resolved. He is passing gas. No pain.  4:45 - He has a small bowel movement. Still drinking GoLytely. No complaint of pain, nausea or vomiting. Discussed disposition. The patietn continues to endorse too much generalized weakness to go to independent living and care for himself. Discussed the patient's care with [facility]. They do not have skilled nursing to care for the patient until he can return to independent living. Consider admission to continue bowel cleansing and patient strengthening.   5:45 - Discussed admission with the hospitalist. Advises that as admission is for consideration of placement in a skilled nursing situation more than for constipation, that social work be consulted in the morning for placement. If placement cannot take place from ED, [facility] will admit to coordinate care.    Charlann Lange, PA-C 06/28/16 [phone]

## 2016-06-27 NOTE — ED Provider Notes (Signed)
Gerald Sandoval Provider Note   CSN: [id number] Arrival date & time: 06/27/16  1942     History   Chief Complaint Chief Complaint  Patient presents with  . Constipation  . Rectal Bleeding    HPI Gerald Sandoval. is a 83 y.o. male.  HPI   Pt p/w constipation and discomfort in the rectum with inability to defecate x 3 days.  Has skin graft left leg yesterday and had to have bladder emptied, has been able to urinate since but notes that it is bloody.  Pt states he is doing well following the surgery yesterday, denies any pain.  Denies abdominal pain, N/V, fevers.    Past Medical History:  Diagnosis Date  . Abscess of left hip    infection left hip  . Anemia   . BPH (benign prostatic hyperplasia)   . Colon polyp   . Diastolic dysfunction   . Dysphagia    unspecified  . Emphysema   . Gastric ulcer   . Gastritis   . Hernia    Rt. Inguinial  . History of colonic polyps   . Hypogonadism in male   . Kidney stones   . Leg wound, left   . Osteoarthritis   . Osteoporosis   . Permanent atrial fibrillation ([place])   . RBBB (right bundle branch block)   . Rotator cuff injury     Patient Active Problem List   Diagnosis Date Noted  . Wound of left leg   . Protein-calorie malnutrition, severe 05/08/2016  . Acute encephalopathy 05/07/2016  . Hematoma of leg, left, initial encounter 05/06/2016  . Hyperglycemia 05/06/2016  . Chronic anemia 05/06/2016  . AKI (acute kidney injury) ([place]) 05/05/2016  . Persistent atrial fibrillation ([place])   . Chronic anticoagulation-Pradaxa prior to admission 06/28/2015  . RBBB 06/28/2015  . DVT of Rt lower limb, acute ([place]) 06/28/2015  . S/P IVC filter 06/27/15 06/28/2015  . S/P Lt hip replacement, chronically infected [facility]) 06/28/2015  . Lactic acidosis   . Encounter for palliative care   . Goals of care, counseling/discussion   . Fracture - Rt hip prosthesis this adm   . Severe sepsis- found down at nursing home 06/25/2015  .  Sepsis ([place]) 06/25/2015  . Edema of right lower extremity 06/25/2015  . History of right hip replacement 06/25/2015  . Hyperkalemia 06/25/2015  . Metabolic acidosis [id number]  . EMPHYSEMA 08/03/2009  . OSTEOARTHRITIS 08/03/2009  . ARTHRITIS 08/03/2009  . GASTRIC ULCER 06/02/2009  . GASTRITIS 06/02/2009  . COLONIC POLYPS 05/03/2009  . DYSPHAGIA UNSPECIFIED 05/03/2009    Past Surgical History:  Procedure Laterality Date  . I&D EXTREMITY Left 06/26/2016   Procedure: LEFT LEG IRRIGATION AND DEBRIDEMENT, SPLIT THICKNESS SKIN GRAFT, WOUND VAC;  Surgeon: Leandrew Koyanagi, MD;  Location:  [facility];  Service: Orthopedics;  Laterality: Left;  . INGUINAL HERNIA REPAIR     RT  . IRRIGATION AND DEBRIDEMENT HEMATOMA     Left leg  . ORIF PERIPROSTHETIC FRACTURE Right 06/29/2015   Procedure: OPEN REDUCTION INTERNAL FIXATION (ORIF) RIGHT HIP PERIPROSTHETIC FRACTURE;  Surgeon: Leandrew Koyanagi, MD;  Location: [facility];  Service: Orthopedics;  Laterality: Right;  . Resection of Ribs    . SKIN SPLIT GRAFT Left 06/26/2016   Procedure: SKIN GRAFT SPLIT THICKNESS;  Surgeon: Leandrew Koyanagi, MD;  Location: [facility];  Service: Orthopedics;  Laterality: Left;  . TOTAL HIP ARTHROPLASTY     Both hips; Left in 2004, Right in 2007  . TRANSURETHRAL  RESECTION OF PROSTATE         Home Medications    Prior to Admission medications   Medication Sig Start Date End Date Taking? Authorizing Provider  apixaban (ELIQUIS) 2.5 MG TABS tablet Take 2.5 mg by mouth 2 (two) times daily.   Yes Historical Provider, MD  cetirizine (ZYRTEC) 10 MG tablet Take 10 mg by mouth daily.    Yes Historical Provider, MD  Cyanocobalamin (VITAMIN B-12) 2500 MCG SUBL Place 2,500 mcg under the tongue 2 (two) times daily.    Yes Historical Provider, MD  ferrous sulfate (IRON SUPPLEMENT) 325 (65 FE) MG tablet Take 325 mg by mouth daily with breakfast.     Yes Historical Provider, MD  finasteride (PROSCAR) 5 MG tablet Take 5 mg by mouth daily.    Yes Historical  Provider, MD  metoprolol (TOPROL-XL) 50 MG 24 hr tablet Take 1.5 tablets (75 mg total) by mouth daily. 04/15/11  Yes Thompson Grayer, MD  mirtazapine (REMERON) 15 MG tablet Take 15 mg by mouth at bedtime.    Yes Historical Provider, MD  senna-docusate (SENOKOT-S) 8.6-50 MG tablet Take 1 tablet by mouth at bedtime. 07/03/15  Yes Thurnell Lose, MD  sulfamethoxazole-trimethoprim (BACTRIM DS,SEPTRA DS) 800-160 MG tablet Take 1 tablet by mouth 2 (two) times daily.   Yes Historical Provider, MD  tamsulosin (FLOMAX) 0.4 MG CAPS capsule Take 0.4 mg by mouth daily.    Yes Historical Provider, MD  traMADol (ULTRAM) 50 MG tablet Take 1 tablet (50 mg total) by mouth every 6 (six) hours as needed. 06/26/16  Yes Naiping Ephriam Jenkins, MD    Family History Family History  Problem Relation Age of Onset  . Colon cancer Maternal Uncle   . Diabetes Maternal Aunt     Social History Social History  Substance Use Topics  . Smoking status: Never Smoker  . Smokeless tobacco: Never Used  . Alcohol use No     Allergies   Hydrocodone-acetaminophen; Seroquel [quetiapine]; and Primidone   Review of Systems Review of Systems   Physical Exam Updated Vital Signs BP 153/85 (BP Location: Left Arm)   Pulse (!) 57   Temp 98.2 F (36.8 C) (Oral)   Resp 18   SpO2 100%   Physical Exam  Constitutional: He appears well-developed and well-nourished. No distress.  HENT:  Head: Normocephalic and atraumatic.  Neck: Neck supple.  Cardiovascular: Normal rate and regular rhythm.   Pulmonary/Chest: Effort normal and breath sounds normal. No respiratory distress. He has no wheezes. He has no rales.  Abdominal: Soft. He exhibits no distension and no mass. There is no tenderness. There is no rebound and no guarding.  Genitourinary: Rectal exam shows anal tone normal.  Genitourinary Comments: +stool impaction   Neurological: He is alert. He exhibits normal muscle tone.  Skin: He is not diaphoretic.  Left leg wound vac in  place.   Nursing note and vitals reviewed.    ED Treatments / Results  Labs (all labs ordered are listed, but only abnormal results are displayed) Labs Reviewed  BASIC METABOLIC PANEL - Abnormal; Notable for the following:       Result Value   Sodium 133 (*)    Chloride 100 (*)    Glucose, Bld 119 (*)    Calcium 8.6 (*)    All other components within normal limits  CBC WITH DIFFERENTIAL/PLATELET - Abnormal; Notable for the following:    RBC 3.26 (*)    Hemoglobin 10.3 (*)    HCT 31.5 (*)  All other components within normal limits  URINALYSIS, ROUTINE W REFLEX MICROSCOPIC    EKG  EKG Interpretation None       Radiology Dg Abd 2 Views  Result Date: 06/27/2016 CLINICAL DATA:  Constipation. EXAM: ABDOMEN - 2 VIEW COMPARISON:  CT 06/26/2015 FINDINGS: Moderate stool distending the rectum. Small to moderate volume of stool throughout the remainder of the colon. No bowel dilatation to suggest obstruction. No free intra- abdominal air. Vascular calcifications are seen. Visualized lung bases (right greater than left) are clear. Bilateral hip arthroplasties. No acute osseous abnormality. IMPRESSION: Moderate stool distending the rectum, can be seen with constipation. No bowel obstruction. Electronically Signed   By: Jeb Levering M.D.   On: 06/27/2016 21:20    Procedures Fecal disimpaction Date/Time: 06/27/2016 8:53 PM Performed by: Clayton Bibles Authorized by: Clayton Bibles  Consent: Verbal consent obtained. Consent given by: patient Local anesthesia used: yes  Anesthesia: Local anesthesia used: yes Local Anesthetic: topical anesthetic  Sedation: Patient sedated: no Patient tolerance: Patient tolerated the procedure well with no immediate complications    (including critical care time)  Medications Ordered in ED Medications  lidocaine (XYLOCAINE) 2 % jelly 1 application (1 application Other Given by Other 06/27/16 2100)     Initial Impression / Assessment and Plan /  ED Course  I have reviewed the triage vital signs and the nursing notes.  Pertinent labs & imaging results that were available during my care of the patient were reviewed by me and considered in my medical decision making (see chart for details).  Clinical Course as of Jun 28 2307  Thu Jun 27, 2016  2219 Bladder scan measures ~320.  Discussed with Dr Kathrynn Humble.  Pt did have surgery under general anesthesia yesterday.  Will perform in and out cath with return precautions for urinary retention.    [EW]    Clinical Course User Index [EW] Clayton Bibles, PA-C    Afebrile, nontoxic patient with c/o constipation and rectal discomfort.  Had stool impaction, disimpacted be me.  Labs without significant change over the past few days.  Abd xray demonstrates constipation.  Enema to be given.    Pt with urine in his bladder and unable to urinate, he does tell me he has been able to urinate most of the day.  As above, in and out cath performed.  UA pending at change of shift.  Enema pending at change of shift.  Pt advised to take his prescribed stool softener.  Pt also advised to return to the ED tomorrow if unable to urinate.  Signed out to [employer], PA-C, at change of shift.            Final Clinical Impressions(s) / ED Diagnoses   Final diagnoses:  Constipation  Fecal impaction [facility])    New Prescriptions New Prescriptions   No medications on file     [place], PA-C 06/27/16 [place], MD 06/29/16 [phone]

## 2016-06-28 MED ORDER — POLYETHYLENE GLYCOL 3350 17 G PO PACK: 17.0000 g | PACK | Freq: Every day | ORAL | 0 refills | Status: AC

## 2016-06-28 MED ORDER — PEG 3350-KCL-NA BICARB-NACL 420 G PO SOLR
4000.0000 mL | Freq: Once | ORAL | Status: AC
  Administered 2016-06-28: 4000 mL via ORAL
  Filled 2016-06-28: qty 4000

## 2016-06-28 NOTE — ED Notes (Signed)
[facility] to transport patient back to [facility].

## 2016-06-28 NOTE — ED Notes (Signed)
Per wound nurse-will come to help remove wound vac.  Patient does not have charger for wound vac present with him.  Battery is no longer active.

## 2016-06-28 NOTE — Evaluation (Signed)
Physical Therapy Evaluation Patient Details Name: Gerald Sandoval. MRN: [id number] DOB: 07-03-1923 Today's Date: 06/28/2016   History of Present Illness   Gerald Sandoval is a 83 y.o. male with medical history significant of afib , recent skin graft left leg, brought to ED with impaction  Clinical Impression  Assisted  Patient to ambuate to BR to have a BM. The VAC was not sealed, RN aware. Per chart, since PT evaluation was performed, the patient is now returning to [facility] via [place]. If remains in hospital, continue PT for mobility.    Follow Up Recommendations SNF;Home health PT (per chart, going to Apt, not snf)    Equipment Recommendations  None recommended by PT    Recommendations for Other Services       Precautions / Restrictions Precautions Precautions: Fall Precaution Comments: left leg VAC      Mobility  Bed Mobility Overal bed mobility: Needs Assistance Bed Mobility: Supine to Sit;Sit to Supine     Supine to sit: Mod assist Sit to supine: Mod assist   General bed mobility comments: assist with the legs to prevent  dislodging the VAC  Transfers Overall transfer level: Needs assistance Equipment used: Rolling walker (2 wheeled) Transfers: Sit to/from Stand Sit to Stand: Min assist;Mod assist         General transfer comment: steady assist to rise from the bed, mod assist to rise from the  low toilet x 2  Ambulation/Gait Ambulation/Gait assistance: Mod assist Ambulation Distance (Feet): 10 Feet Assistive device: Rolling walker (2 wheeled) Gait Pattern/deviations: Step-through pattern;Step-to pattern     General Gait Details: x 2 to [place]. VAC was  held to prevent pulling  Stairs            Wheelchair Mobility    Modified Rankin (Stroke Patients Only)       Balance Overall balance assessment: Needs assistance;History of Falls Sitting-balance support: Bilateral upper extremity supported;Feet supported Sitting balance-Leahy Scale:  Fair     Standing balance support: During functional activity;Bilateral upper extremity supported Standing balance-Leahy Scale: Poor                               Pertinent Vitals/Pain Pain Assessment: Faces Faces Pain Scale: Hurts even more Pain Location: rectum Pain Descriptors / Indicators: Burning;Tender Pain Intervention(s): Monitored during session    Home Living Family/patient expects to be discharged to:: Private residence Living Arrangements: Alone Available Help at Discharge: Personal care attendant Type of Home: Independent living facility Home Access: [place]: One level Home Equipment: Environmental consultant - 4 wheels;Wheelchair - manual      Prior Function Level of Independence: Independent with assistive device(s)         Comments: per patient , is rolled to meals, has  someone come to assist with exercises from what patient described     Hand Dominance        Extremity/Trunk Assessment   Upper Extremity Assessment Upper Extremity Assessment: Generalized weakness    Lower Extremity Assessment Lower Extremity Assessment: Generalized weakness;LLE deficits/detail LLE Deficits / Details: wound vac on the lower leg that is not sealed    Cervical / Trunk Assessment Cervical / Trunk Assessment: Kyphotic  Communication   Communication: HOH  Cognition Arousal/Alertness: Awake/alert Behavior During Therapy: WFL for tasks assessed/performed Overall Cognitive Status: Within Functional Limits for tasks assessed  General Comments      Exercises     Assessment/Plan    PT Assessment Patient needs continued PT services  PT Problem List Decreased strength;Decreased activity tolerance;Decreased balance;Decreased mobility;Decreased safety awareness;Decreased knowledge of precautions;Pain;Decreased skin integrity          PT Treatment Interventions DME instruction;Gait training;Therapeutic  activities;Functional mobility training    PT Goals (Current goals can be found in the Care Plan section)  Acute Rehab PT Goals Patient Stated Goal: i want to go home, have a BM PT Goal Formulation: With patient Time For Goal Achievement: 07/12/16 Potential to Achieve Goals: Good    Frequency Min 2X/week   Barriers to discharge Decreased caregiver support      Co-evaluation               End of Session   Activity Tolerance: Patient limited by fatigue Patient left: in bed;with call bell/phone within reach;with bed alarm set Nurse Communication: Mobility status         Time: [id number] PT Time Calculation (min) (ACUTE ONLY): 26 min   Charges:   PT Evaluation $PT Eval Low Complexity: 1 Procedure PT Treatments $Gait Training: 8-22 mins   PT G Codes:        Gerald Sandoval 06/28/2016, 12:49 PM

## 2016-06-28 NOTE — NC FL2 (Signed)
[place] LEVEL OF CARE SCREENING TOOL     IDENTIFICATION  Patient Name: Gerald Sandoval. Birthdate: November 10, 1923 Sex: male Admission Date (Current Location): 06/27/2016  [facility] and [place] Number:  Herbalist and Address:  [facility],  [place] [address], [place]      Provider Number: [id number]  Attending Physician Name and Address:  No att. providers found  Relative Name and Phone Number:  Prudencio Pair (Niece) [phone]    Current Level of Care: Hospital Recommended Level of Care: [place] Prior Approval Number:    Date Approved/Denied:   PASRR Number: [id number]  Discharge Plan: SNF    Current Diagnoses: Patient Active Problem List   Diagnosis Date Noted  . Wound of left leg   . Protein-calorie malnutrition, severe 05/08/2016  . Acute encephalopathy 05/07/2016  . Hematoma of leg, left, initial encounter 05/06/2016  . Hyperglycemia 05/06/2016  . Chronic anemia 05/06/2016  . AKI (acute kidney injury) ([place]) 05/05/2016  . Persistent atrial fibrillation ([place])   . Chronic anticoagulation-Pradaxa prior to admission 06/28/2015  . RBBB 06/28/2015  . DVT of Rt lower limb, acute ([place]) 06/28/2015  . S/P IVC filter 06/27/15 06/28/2015  . S/P Lt hip replacement, chronically infected [facility]) 06/28/2015  . Lactic acidosis   . Encounter for palliative care   . Goals of care, counseling/discussion   . Fracture - Rt hip prosthesis this adm   . Severe sepsis- found down at nursing home 06/25/2015  . Sepsis ([place]) 06/25/2015  . Edema of right lower extremity 06/25/2015  . History of right hip replacement 06/25/2015  . Hyperkalemia 06/25/2015  . Metabolic acidosis [id number]  . EMPHYSEMA 08/03/2009  . OSTEOARTHRITIS 08/03/2009  . ARTHRITIS 08/03/2009  . GASTRIC ULCER 06/02/2009  . GASTRITIS 06/02/2009  . COLONIC POLYPS 05/03/2009  . DYSPHAGIA UNSPECIFIED 05/03/2009    Orientation RESPIRATION BLADDER Height &  Weight     Self, Place, Situation  Normal Incontinent Weight:   Height:     BEHAVIORAL SYMPTOMS/MOOD NEUROLOGICAL BOWEL NUTRITION STATUS      Continent Diet (Low sodium, Heart Healthy)  AMBULATORY STATUS COMMUNICATION OF NEEDS Skin   Extensive Assist Verbally Other (Comment) (Negative Pressure Wound, Surgical Leg Left)                       Personal Care Assistance Level of Assistance    Bathing Assistance: Limited assistance Feeding assistance: Independent Dressing Assistance: Limited assistance     Functional Limitations Info  Sight, Hearing, Speech Sight Info: Adequate Hearing Info: Adequate Speech Info: Adequate    SPECIAL CARE FACTORS FREQUENCY  PT (By licensed PT), OT (By licensed OT)     PT Frequency: 5X OT Frequency: 5X            Contractures Contractures Info: Present    Additional Factors Info  Code Status, Allergies Code Status Info: Fullcode Allergies Info: Hydrocodone-acetaminophen, Seroquel Quetiapine, Primidone           Current Medications (06/28/2016):  This is the current hospital active medication list No current facility-administered medications for this encounter.    Current Outpatient Prescriptions  Medication Sig Dispense Refill  . apixaban (ELIQUIS) 2.5 MG TABS tablet Take 2.5 mg by mouth 2 (two) times daily.    . cetirizine (ZYRTEC) 10 MG tablet Take 10 mg by mouth daily.     . Cyanocobalamin (VITAMIN B-12) 2500 MCG SUBL Place 2,500 mcg under the tongue 2 (two) times daily.     Marland Kitchen  ferrous sulfate (IRON SUPPLEMENT) 325 (65 FE) MG tablet Take 325 mg by mouth daily with breakfast.      . finasteride (PROSCAR) 5 MG tablet Take 5 mg by mouth daily.     . metoprolol (TOPROL-XL) 50 MG 24 hr tablet Take 1.5 tablets (75 mg total) by mouth daily. 30 tablet 8  . mirtazapine (REMERON) 15 MG tablet Take 15 mg by mouth at bedtime.     . senna-docusate (SENOKOT-S) 8.6-50 MG tablet Take 1 tablet by mouth at bedtime.    .  sulfamethoxazole-trimethoprim (BACTRIM DS,SEPTRA DS) 800-160 MG tablet Take 1 tablet by mouth 2 (two) times daily.    . tamsulosin (FLOMAX) 0.4 MG CAPS capsule Take 0.4 mg by mouth daily.     . traMADol (ULTRAM) 50 MG tablet Take 1 tablet (50 mg total) by mouth every 6 (six) hours as needed. 30 tablet 0  . polyethylene glycol (MIRALAX) packet Take 17 g by mouth daily. 3 each 0     Discharge Medications: Please see discharge summary for a list of discharge medications.  Relevant Imaging Results:  Relevant Lab Results:   Additional Information SS# [id number]  Lia Hopping, LCSW

## 2016-06-28 NOTE — Progress Notes (Signed)
LCSWA met with patient at bedside. Patient alert to self and place. Patient reports he is from independently living at [facility]. LCSWA explained, medical staff is recommending rehab at this time due to his weakness. Patient is agreeable to rehab at this time. Patient reports he has been to [facility] in the past and does not mind going back. LCSWA inform pt. [facility] may not have a bed, therefore will fax his clinical information to facilities in [place] and provide bed offers.  Patient reports he has nieces and a Environmental health practitioner Pershing Proud that can be contacted.   LCSWA Informed RN, Pt. Needs a PT consult.

## 2016-06-28 NOTE — ED Notes (Signed)
SW to see patient.

## 2016-06-28 NOTE — Progress Notes (Signed)
CSW spoke with patients niece, Jeani Hawking, regarding discharge. Patient would like to return to [facility] at this time and no longer wants to go to SNF. Patient and family are both agreeable to return to [facility] at this time. Ms. Jeani Hawking states patient receives PT/ OT and has an aid at home. Ms. Jeani Hawking would like CSW to contact her once Corey Harold is here in order to have aid at patients residence. Updated RN, EDP, and patient/family.  Kingsley Spittle, LCSWA Clinical Social Worker [phone]

## 2016-06-28 NOTE — ED Notes (Signed)
Pt. Tolerated  4 cups of the NuLYTELY, had BM once in small amount, soft .

## 2016-06-28 NOTE — ED Provider Notes (Signed)
Patient received at shift change from previous PA [place]. Please see previous note for full H&P. Briefly patient is a 84 year old male with extensive past medical history including weakness who presented from independent living facility [facility]) for constipation and slight rectal bleeding 3 days.  ED workup included a urinalysis, BMP, CBC, abdominal plain films which were all normal. No obstruction seen. Vital signs have been within normal limits so far in ED. Patient was disimpacted in ED, enema given, currently taking GoLYTELY with small successful BM. Patient continues to deny abdominal pain, nausea, vomiting. Patient is passing gas.  Wound nurse consulted for wound vac.  Prior to discharge patient expressed concern for generalized weakness and being concerned about going back to independent living in caring for himself. Social work [place]. Physical therapy consult pending prior to skilled nursing facility placement.    1128: PT recommended SNF placement, SW onboard and will place patient.  Spoke to POA [employer]) and given update. POA stated she does not want pt to go to SNF.    1215: Spoke to patient who expressed concern about constipation and not having a BM stating he does not want to go back to independent living because he doesn't think his constipation has been fixed.  Pt does have a h/o of chronic constipation.  Pt is alert and oriented to self, place and time.  Pt is a good historian.  I think patient is alert enough to make his own decision and it appears he is more concerned about constipation and having a BM. Pt states he can take care of himself but can't "take care of his constipation" at home.  I explained to pt this is a chronic issue and that we have treated it in the ED with at least 2 successful BMs. Had long discussion with pt regarding chronic constipation.  Pt agreed to go back to independent living.  Pt ready for discharge.   Kinnie Feil, PA-C 06/28/16  [place] Gerrie Castiglia, PA-C [id number]    Delora Fuel, MD [id number] [phone]

## 2016-06-28 NOTE — ED Notes (Signed)
Pt at bedside

## 2016-06-30 ENCOUNTER — Emergency Department (HOSPITAL_COMMUNITY): Payer: Medicare Other

## 2016-06-30 ENCOUNTER — Emergency Department (HOSPITAL_COMMUNITY)
Admission: EM | Admit: 2016-06-30 | Discharge: 2016-06-30 | Disposition: A | Discharge status: Home / Self Care | Payer: Medicare Other | Attending: Emergency Medicine | Admitting: Emergency Medicine

## 2016-06-30 ENCOUNTER — Encounter (HOSPITAL_COMMUNITY): Payer: Self-pay | Admitting: *Deleted

## 2016-06-30 DIAGNOSIS — Z7901 Long term (current) use of anticoagulants: Secondary | ICD-10-CM | POA: Diagnosis not present

## 2016-06-30 DIAGNOSIS — S0990XA Unspecified injury of head, initial encounter: Secondary | ICD-10-CM | POA: Diagnosis not present

## 2016-06-30 DIAGNOSIS — S7001XA Contusion of right hip, initial encounter: Secondary | ICD-10-CM

## 2016-06-30 DIAGNOSIS — W228XXA Striking against or struck by other objects, initial encounter: Secondary | ICD-10-CM | POA: Insufficient documentation

## 2016-06-30 DIAGNOSIS — Y9301 Activity, walking, marching and hiking: Secondary | ICD-10-CM | POA: Insufficient documentation

## 2016-06-30 DIAGNOSIS — Z79899 Other long term (current) drug therapy: Secondary | ICD-10-CM | POA: Diagnosis not present

## 2016-06-30 DIAGNOSIS — Y929 Unspecified place or not applicable: Secondary | ICD-10-CM | POA: Insufficient documentation

## 2016-06-30 DIAGNOSIS — Z96642 Presence of left artificial hip joint: Secondary | ICD-10-CM | POA: Insufficient documentation

## 2016-06-30 DIAGNOSIS — S79911A Unspecified injury of right hip, initial encounter: Secondary | ICD-10-CM | POA: Diagnosis present

## 2016-06-30 DIAGNOSIS — M25551 Pain in right hip: Secondary | ICD-10-CM | POA: Diagnosis not present

## 2016-06-30 DIAGNOSIS — Y999 Unspecified external cause status: Secondary | ICD-10-CM | POA: Diagnosis not present

## 2016-06-30 DIAGNOSIS — T148XXA Other injury of unspecified body region, initial encounter: Secondary | ICD-10-CM | POA: Diagnosis not present

## 2016-06-30 DIAGNOSIS — R102 Pelvic and perineal pain: Secondary | ICD-10-CM | POA: Diagnosis not present

## 2016-06-30 NOTE — ED Provider Notes (Signed)
[facility] DEPT Provider Note   CSN: [id number] Arrival date & time: 06/30/16  [id number]     History   Chief Complaint Chief Complaint  Patient presents with  . Fall    HPI Gerald Sandoval. is a 83 y.o. male.  Pt presents to the ED this morning due to right hip pain.  The pt fell on the evening of 2/2 and hit his right hip.  The pt has had pain in that hip when walking since the fall.  He did hit his head, but did not have a loc.  He is on Eliquis for a.fib (CHADVASC  3).      Past Medical History:  Diagnosis Date  . Abscess of left hip    infection left hip  . Anemia   . BPH (benign prostatic hyperplasia)   . Colon polyp   . Diastolic dysfunction   . Dysphagia    unspecified  . Emphysema   . Gastric ulcer   . Gastritis   . Hernia    Rt. Inguinial  . History of colonic polyps   . Hypogonadism in male   . Kidney stones   . Leg wound, left   . Osteoarthritis   . Osteoporosis   . Permanent atrial fibrillation ([place])   . RBBB (right bundle branch block)   . Rotator cuff injury     Patient Active Problem List   Diagnosis Date Noted  . Wound of left leg   . Protein-calorie malnutrition, severe 05/08/2016  . Acute encephalopathy 05/07/2016  . Hematoma of leg, left, initial encounter 05/06/2016  . Hyperglycemia 05/06/2016  . Chronic anemia 05/06/2016  . AKI (acute kidney injury) ([place]) 05/05/2016  . Persistent atrial fibrillation ([place])   . Chronic anticoagulation-Pradaxa prior to admission 06/28/2015  . RBBB 06/28/2015  . DVT of Rt lower limb, acute ([place]) 06/28/2015  . S/P IVC filter 06/27/15 06/28/2015  . S/P Lt hip replacement, chronically infected [facility]) 06/28/2015  . Lactic acidosis   . Encounter for palliative care   . Goals of care, counseling/discussion   . Fracture - Rt hip prosthesis this adm   . Severe sepsis- found down at nursing home 06/25/2015  . Sepsis ([place]) 06/25/2015  . Edema of right lower extremity 06/25/2015  . History of right hip  replacement 06/25/2015  . Hyperkalemia 06/25/2015  . Metabolic acidosis [id number]  . EMPHYSEMA 08/03/2009  . OSTEOARTHRITIS 08/03/2009  . ARTHRITIS 08/03/2009  . GASTRIC ULCER 06/02/2009  . GASTRITIS 06/02/2009  . COLONIC POLYPS 05/03/2009  . DYSPHAGIA UNSPECIFIED 05/03/2009    Past Surgical History:  Procedure Laterality Date  . I&D EXTREMITY Left 06/26/2016   Procedure: LEFT LEG IRRIGATION AND DEBRIDEMENT, SPLIT THICKNESS SKIN GRAFT, WOUND VAC;  Surgeon: Leandrew Koyanagi, MD;  Location: [facility];  Service: Orthopedics;  Laterality: Left;  . INGUINAL HERNIA REPAIR     RT  . IRRIGATION AND DEBRIDEMENT HEMATOMA     Left leg  . ORIF PERIPROSTHETIC FRACTURE Right 06/29/2015   Procedure: OPEN REDUCTION INTERNAL FIXATION (ORIF) RIGHT HIP PERIPROSTHETIC FRACTURE;  Surgeon: Leandrew Koyanagi, MD;  Location: [facility];  Service: Orthopedics;  Laterality: Right;  . Resection of Ribs    . SKIN SPLIT GRAFT Left 06/26/2016   Procedure: SKIN GRAFT SPLIT THICKNESS;  Surgeon: Leandrew Koyanagi, MD;  Location: [facility];  Service: Orthopedics;  Laterality: Left;  . TOTAL HIP ARTHROPLASTY     Both hips; Left in 2004, Right in 2007  . TRANSURETHRAL RESECTION OF PROSTATE  Home Medications    Prior to Admission medications   Medication Sig Start Date End Date Taking? Authorizing Provider  cetirizine (ZYRTEC) 10 MG tablet Take 10 mg by mouth daily.    Yes Historical Provider, MD  Cyanocobalamin (VITAMIN B-12) 2500 MCG SUBL Place 2,500 mcg under the tongue 2 (two) times daily.    Yes Historical Provider, MD  ferrous sulfate (IRON SUPPLEMENT) 325 (65 FE) MG tablet Take 325 mg by mouth daily with breakfast.     Yes Historical Provider, MD  finasteride (PROSCAR) 5 MG tablet Take 5 mg by mouth daily.    Yes Historical Provider, MD  metoprolol (TOPROL-XL) 50 MG 24 hr tablet Take 1.5 tablets (75 mg total) by mouth daily. 04/15/11  Yes Thompson Grayer, MD  mirtazapine (REMERON) 15 MG tablet Take 15 mg by mouth at bedtime.     Yes Historical Provider, MD  polyethylene glycol (MIRALAX) packet Take 17 g by mouth daily. 06/28/16  Yes Kinnie Feil, PA-C  senna-docusate (SENOKOT-S) 8.6-50 MG tablet Take 1 tablet by mouth at bedtime. 07/03/15  Yes Thurnell Lose, MD  sulfamethoxazole-trimethoprim (BACTRIM DS,SEPTRA DS) 800-160 MG tablet Take 1 tablet by mouth 2 (two) times daily.   Yes Historical Provider, MD  tamsulosin (FLOMAX) 0.4 MG CAPS capsule Take 0.4 mg by mouth daily.    Yes Historical Provider, MD  traMADol (ULTRAM) 50 MG tablet Take 1 tablet (50 mg total) by mouth every 6 (six) hours as needed. Patient taking differently: Take 50 mg by mouth every 6 (six) hours as needed for moderate pain.  06/26/16  Yes Naiping Ephriam Jenkins, MD    Family History Family History  Problem Relation Age of Onset  . Colon cancer Maternal Uncle   . Diabetes Maternal Aunt     Social History Social History  Substance Use Topics  . Smoking status: Never Smoker  . Smokeless tobacco: Never Used  . Alcohol use No     Allergies   Hydrocodone-acetaminophen; Seroquel [quetiapine]; and Primidone   Review of Systems Review of Systems  Musculoskeletal:       Right hip pain  All other systems reviewed and are negative.    Physical Exam Updated Vital Signs BP 108/77 (BP Location: Right Arm)   Pulse 107   Temp 98.6 F (37 C) (Oral)   Resp 16   SpO2 97%   Physical Exam  Constitutional: He is oriented to person, place, and time. He appears well-developed and well-nourished.  HENT:  Head: Normocephalic.    Right Ear: External ear normal.  Left Ear: External ear normal.  Nose: Nose normal.  Mouth/Throat: Oropharynx is clear and moist.  Eyes: Conjunctivae and EOM are normal. Pupils are equal, round, and reactive to light.  Neck: Normal range of motion. Neck supple.  Cardiovascular: Normal rate, normal heart sounds and intact distal pulses.  An irregularly irregular rhythm present.  Pulmonary/Chest: Effort normal and breath  sounds normal.  Abdominal: Soft. Bowel sounds are normal.  Musculoskeletal:       Legs: Neurological: He is alert and oriented to person, place, and time.  Skin: Skin is warm and dry.     Psychiatric: He has a normal mood and affect. His behavior is normal.  Nursing note and vitals reviewed.    ED Treatments / Results  Labs (all labs ordered are listed, but only abnormal results are displayed) Labs Reviewed - No data to display  EKG  EKG Interpretation None       Radiology Ct  Head Wo Contrast  Result Date: 06/30/2016 CLINICAL DATA:  83 year old male status post fall with head injury over right eye. On blood thinners. Initial encounter. EXAM: CT HEAD WITHOUT CONTRAST TECHNIQUE: Contiguous axial images were obtained from the base of the skull through the vertex without intravenous contrast. COMPARISON:  Head and cervical spine CT 05/05/2016 and earlier. FINDINGS: Brain: Confluent cerebral white matter hypodensity. Stable cerebral volume. No midline shift, ventriculomegaly, mass effect, evidence of mass lesion, intracranial hemorrhage or evidence of cortically based acute infarction. No cortical encephalomalacia identified. Vascular: Calcified atherosclerosis at the skull base. No suspicious intracranial vascular hyperdensity. Skull: Stable.  No acute osseous abnormality identified. Sinuses/Orbits: Visualized paranasal sinuses and mastoids are stable and well pneumatized. Other: No scalp hematoma. Visualized orbit soft tissues are within normal limits. IMPRESSION: Stable non contrast CT appearance of the brain with advanced chronic white matter disease. No acute intracranial abnormality or acute traumatic injury identified. Electronically Signed   By: Genevie Ann M.D.   On: 06/30/2016 10:17   Dg Hip Unilat W Or Wo Pelvis 2-3 Views Right  Result Date: 06/30/2016 CLINICAL DATA:  83 year old male with right hip pain. Possible fall. Initial encounter. EXAM: DG HIP (WITH OR WITHOUT PELVIS) 2-3V  RIGHT COMPARISON:  06/29/2015 and earlier. FINDINGS: Sequelae of bilateral total hip arthroplasty again noted. Superimposed lateral plate with cortical screws and cerclage wires about the proximal right femur as before. Right femur and hip arthroplasty components appear stable and intact. No right hip hardware loosening identified. Partial visualization of left total hip arthroplasty hardware. Osteopenia. Extensive iliofemoral calcified atherosclerosis. No acute pelvic fracture identified. Negative visible bowel gas pattern. IMPRESSION: No acute fracture or dislocation identified about the right hip or pelvis. Osteopenia, prior total hip arthroplasties, and extensive ORIF hardware about the right hip. Electronically Signed   By: Genevie Ann M.D.   On: 06/30/2016 10:20    Procedures Procedures (including critical care time)  Medications Ordered in ED Medications - No data to display   Initial Impression / Assessment and Plan / ED Course  I have reviewed the triage vital signs and the nursing notes.  Pertinent labs & imaging results that were available during my care of the patient were reviewed by me and considered in my medical decision making (see chart for details).    Pt is not broken.  Pt to be d/c with instructions to return if worse.  Final Clinical Impressions(s) / ED Diagnoses   Final diagnoses:  Contusion of right hip, initial encounter    New Prescriptions New Prescriptions   No medications on file     Isla Pence, MD 06/30/16 1050

## 2016-06-30 NOTE — ED Triage Notes (Signed)
Pt to ED via [facility] 

## 2016-06-30 NOTE — ED Notes (Signed)
Called ptar to transport pt back to [facility]

## 2016-06-30 NOTE — ED Notes (Signed)
Patient transported to X-ray 

## 2016-07-02 ENCOUNTER — Ambulatory Visit (INDEPENDENT_AMBULATORY_CARE_PROVIDER_SITE_OTHER): Payer: Medicare Other | Admitting: Orthopaedic Surgery

## 2016-07-02 ENCOUNTER — Encounter (INDEPENDENT_AMBULATORY_CARE_PROVIDER_SITE_OTHER): Payer: Self-pay | Admitting: Orthopaedic Surgery

## 2016-07-02 DIAGNOSIS — S8012XA Contusion of left lower leg, initial encounter: Secondary | ICD-10-CM

## 2016-07-02 NOTE — Progress Notes (Signed)
Patient is 6 days s/p left lower leg STSG.  Doing well.  Skin graft with 100% take.  Donor site is benign.  Staples removed.  Wet to dry dressings BID to recipient site and daily xeroform to donor site.  F/u 2 weeks for recheck.

## 2016-07-04 ENCOUNTER — Telehealth (INDEPENDENT_AMBULATORY_CARE_PROVIDER_SITE_OTHER): Payer: Self-pay | Admitting: Orthopaedic Surgery

## 2016-07-04 DIAGNOSIS — F329 Major depressive disorder, single episode, unspecified: Secondary | ICD-10-CM | POA: Diagnosis not present

## 2016-07-04 DIAGNOSIS — K5909 Other constipation: Secondary | ICD-10-CM | POA: Diagnosis not present

## 2016-07-04 DIAGNOSIS — N401 Enlarged prostate with lower urinary tract symptoms: Secondary | ICD-10-CM | POA: Diagnosis not present

## 2016-07-04 DIAGNOSIS — Z6820 Body mass index (BMI) 20.0-20.9, adult: Secondary | ICD-10-CM | POA: Diagnosis not present

## 2016-07-04 NOTE — Progress Notes (Signed)
   06/28/16 1245  PT Time Calculation  PT Start Time (ACUTE ONLY) 1015  PT Stop Time (ACUTE ONLY) 1041  PT Time Calculation (min) (ACUTE ONLY) 26 min  PT G-Codes **NOT FOR INPATIENT CLASS**  Functional Assessment Tool Used clinical judgement  Functional Limitation Mobility: Walking and moving around  Mobility: Walking and Moving Around Goal Status [phone]) CJ  Mobility: Walking and Moving Around Discharge Status [id number]) CI  PT General Charges  $$ ACUTE PT VISIT 1 Procedure  PT Evaluation  $PT Eval Low Complexity 1 Procedure  PT Treatments  $Gait Training 8-22 mins  [place] PT [phone]

## 2016-07-04 NOTE — Telephone Encounter (Signed)
Please advise on both please. See message below. Thank you

## 2016-07-04 NOTE — Telephone Encounter (Signed)
Debbie (nurse) from [facility] called advised she saw patient yesterday and the saline dressing was stuck to the wound. Debbie ask if she can do another type of dressing. She asked if she can decrease his visits to once a day instead of twice a day. The number to contact her is [phone]  The fax# is [phone]

## 2016-07-04 NOTE — Telephone Encounter (Signed)
Do wet to dry dressings TID instead of BID.

## 2016-07-05 ENCOUNTER — Telehealth (INDEPENDENT_AMBULATORY_CARE_PROVIDER_SITE_OTHER): Payer: Self-pay

## 2016-07-05 DIAGNOSIS — N401 Enlarged prostate with lower urinary tract symptoms: Secondary | ICD-10-CM | POA: Diagnosis not present

## 2016-07-05 NOTE — Telephone Encounter (Signed)
What about decreasing the number of visits

## 2016-07-05 NOTE — Telephone Encounter (Signed)
Lynn-Niece calling asking if they can use the vasoline dressing ONCE a day vs the wet to dry three times a day? [phone]

## 2016-07-05 NOTE — Telephone Encounter (Signed)
As long as the family is comfortable with doing the dressing changes, that's fine.

## 2016-07-05 NOTE — Telephone Encounter (Signed)
yes

## 2016-07-08 NOTE — Telephone Encounter (Signed)
Patient niece aware of the below message from [place]

## 2016-07-09 DIAGNOSIS — D62 Acute posthemorrhagic anemia: Secondary | ICD-10-CM | POA: Diagnosis not present

## 2016-07-09 DIAGNOSIS — I482 Chronic atrial fibrillation: Secondary | ICD-10-CM | POA: Diagnosis not present

## 2016-07-09 DIAGNOSIS — S81802D Unspecified open wound, left lower leg, subsequent encounter: Secondary | ICD-10-CM | POA: Diagnosis not present

## 2016-07-09 DIAGNOSIS — E43 Unspecified severe protein-calorie malnutrition: Secondary | ICD-10-CM | POA: Diagnosis not present

## 2016-07-09 DIAGNOSIS — Z48298 Encounter for aftercare following other organ transplant: Secondary | ICD-10-CM | POA: Diagnosis not present

## 2016-07-09 DIAGNOSIS — J439 Emphysema, unspecified: Secondary | ICD-10-CM | POA: Diagnosis not present

## 2016-07-11 ENCOUNTER — Inpatient Hospital Stay (INDEPENDENT_AMBULATORY_CARE_PROVIDER_SITE_OTHER): Payer: Medicare Other | Admitting: Orthopaedic Surgery

## 2016-07-16 ENCOUNTER — Ambulatory Visit (INDEPENDENT_AMBULATORY_CARE_PROVIDER_SITE_OTHER): Payer: Medicare Other | Admitting: Orthopaedic Surgery

## 2016-07-16 DIAGNOSIS — M1711 Unilateral primary osteoarthritis, right knee: Secondary | ICD-10-CM | POA: Diagnosis not present

## 2016-07-16 DIAGNOSIS — S8012XA Contusion of left lower leg, initial encounter: Secondary | ICD-10-CM

## 2016-07-16 NOTE — Progress Notes (Signed)
Office Visit Note   Patient: Gerald Sandoval.           Date of Birth: 1923/06/16           MRN: [id number] Visit Date: 07/16/2016              Requested by: Crist Infante, MD [address] [place], [place] [address] PCP: Jerlyn Ly, MD   Assessment & Plan: Visit Diagnoses:  1. Hematoma of leg, left, initial encounter   2. Unilateral primary osteoarthritis, right knee     Plan: right knee injection performed today.  Continue xeroform daily and vaseline until fully integrated.  F/u prn.  Follow-Up Instructions: Return if symptoms worsen or fail to improve.   Orders:  No orders of the defined types were placed in this encounter.  No orders of the defined types were placed in this encounter.     Procedures: Large Joint Inj Date/Time: 07/16/2016 1:06 PM Performed by: Leandrew Koyanagi Authorized by: Leandrew Koyanagi   Consent Given by:  Patient Timeout: prior to procedure the correct patient, procedure, and site was verified   Indications:  Pain Location:  Knee Site:  R knee Prep: patient was prepped and draped in usual sterile fashion   Needle Size:  22 G Ultrasound Guidance: No   Fluoroscopic Guidance: No   Arthrogram: No   Patient tolerance:  Patient tolerated the procedure well with no immediate complications     Clinical Data: No additional findings.   Subjective: No chief complaint on file.   Patient f/u today for right knee OA and f/u of LLE skin graft.  He's doing well. Just c/o right knee pain.  Would like another injection.    Review of Systems   Objective: Vital Signs: There were no vitals taken for this visit.  Physical Exam  Ortho Exam LLE recipient site shows 100% incorporation of STSG Specialty Comments:  No specialty comments available.  Imaging: No results found.   PMFS History: Patient Active Problem List   Diagnosis Date Noted  . Wound of left leg   . Protein-calorie malnutrition, severe 05/08/2016  . Acute encephalopathy  05/07/2016  . Hematoma of leg, left, initial encounter 05/06/2016  . Hyperglycemia 05/06/2016  . Chronic anemia 05/06/2016  . AKI (acute kidney injury) ([place]) 05/05/2016  . Persistent atrial fibrillation ([place])   . Chronic anticoagulation-Pradaxa prior to admission 06/28/2015  . RBBB 06/28/2015  . DVT of Rt lower limb, acute ([place]) 06/28/2015  . S/P IVC filter 06/27/15 06/28/2015  . S/P Lt hip replacement, chronically infected [facility]) 06/28/2015  . Lactic acidosis   . Encounter for palliative care   . Goals of care, counseling/discussion   . Fracture - Rt hip prosthesis this adm   . Severe sepsis- found down at nursing home 06/25/2015  . Sepsis ([place]) 06/25/2015  . Edema of right lower extremity 06/25/2015  . History of right hip replacement 06/25/2015  . Hyperkalemia 06/25/2015  . Metabolic acidosis [id number]  . EMPHYSEMA 08/03/2009  . Unilateral primary osteoarthritis, right knee 08/03/2009  . ARTHRITIS 08/03/2009  . GASTRIC ULCER 06/02/2009  . GASTRITIS 06/02/2009  . COLONIC POLYPS 05/03/2009  . DYSPHAGIA UNSPECIFIED 05/03/2009   Past Medical History:  Diagnosis Date  . Abscess of left hip    infection left hip  . Anemia   . BPH (benign prostatic hyperplasia)   . Colon polyp   . Diastolic dysfunction   . Dysphagia    unspecified  . Emphysema   .  Gastric ulcer   . Gastritis   . Hernia    Rt. Inguinial  . History of colonic polyps   . Hypogonadism in male   . Kidney stones   . Leg wound, left   . Osteoarthritis   . Osteoporosis   . Permanent atrial fibrillation ([place])   . RBBB (right bundle branch block)   . Rotator cuff injury     Family History  Problem Relation Age of Onset  . Colon cancer Maternal Uncle   . Diabetes Maternal Aunt     Past Surgical History:  Procedure Laterality Date  . I&D EXTREMITY Left 06/26/2016   Procedure: LEFT LEG IRRIGATION AND DEBRIDEMENT, SPLIT THICKNESS SKIN GRAFT, WOUND VAC;  Surgeon: Leandrew Koyanagi, MD;  Location: [facility];   Service: Orthopedics;  Laterality: Left;  . INGUINAL HERNIA REPAIR     RT  . IRRIGATION AND DEBRIDEMENT HEMATOMA     Left leg  . ORIF PERIPROSTHETIC FRACTURE Right 06/29/2015   Procedure: OPEN REDUCTION INTERNAL FIXATION (ORIF) RIGHT HIP PERIPROSTHETIC FRACTURE;  Surgeon: Leandrew Koyanagi, MD;  Location: [facility];  Service: Orthopedics;  Laterality: Right;  . Resection of Ribs    . SKIN SPLIT GRAFT Left 06/26/2016   Procedure: SKIN GRAFT SPLIT THICKNESS;  Surgeon: Leandrew Koyanagi, MD;  Location: [facility];  Service: Orthopedics;  Laterality: Left;  . TOTAL HIP ARTHROPLASTY     Both hips; Left in 2004, Right in 2007  . TRANSURETHRAL RESECTION OF PROSTATE     Social History   Occupational History  . Retired: [employer]    Social History Main Topics  . Smoking status: Never Smoker  . Smokeless tobacco: Never Used  . Alcohol use No  . Drug use: No  . Sexual activity: Not on file

## 2016-07-17 DIAGNOSIS — R300 Dysuria: Secondary | ICD-10-CM | POA: Diagnosis not present

## 2016-07-17 DIAGNOSIS — N401 Enlarged prostate with lower urinary tract symptoms: Secondary | ICD-10-CM | POA: Diagnosis not present

## 2016-07-17 DIAGNOSIS — R351 Nocturia: Secondary | ICD-10-CM | POA: Diagnosis not present

## 2016-09-16 DIAGNOSIS — I480 Paroxysmal atrial fibrillation: Secondary | ICD-10-CM | POA: Diagnosis not present

## 2016-09-16 DIAGNOSIS — M90859 Osteopathy in diseases classified elsewhere, unspecified thigh: Secondary | ICD-10-CM | POA: Diagnosis not present

## 2016-09-16 DIAGNOSIS — Z6822 Body mass index (BMI) 22.0-22.9, adult: Secondary | ICD-10-CM | POA: Diagnosis not present

## 2017-02-12 DIAGNOSIS — Z79899 Other long term (current) drug therapy: Secondary | ICD-10-CM | POA: Diagnosis not present

## 2017-02-12 DIAGNOSIS — R634 Abnormal weight loss: Secondary | ICD-10-CM | POA: Diagnosis not present

## 2017-02-12 DIAGNOSIS — E538 Deficiency of other specified B group vitamins: Secondary | ICD-10-CM | POA: Diagnosis not present

## 2017-02-12 DIAGNOSIS — D519 Vitamin B12 deficiency anemia, unspecified: Secondary | ICD-10-CM | POA: Diagnosis not present

## 2017-02-12 DIAGNOSIS — Z125 Encounter for screening for malignant neoplasm of prostate: Secondary | ICD-10-CM | POA: Diagnosis not present

## 2017-02-19 DIAGNOSIS — D6489 Other specified anemias: Secondary | ICD-10-CM | POA: Diagnosis not present

## 2017-02-19 DIAGNOSIS — M810 Age-related osteoporosis without current pathological fracture: Secondary | ICD-10-CM | POA: Diagnosis not present

## 2017-02-19 DIAGNOSIS — I480 Paroxysmal atrial fibrillation: Secondary | ICD-10-CM | POA: Diagnosis not present

## 2017-02-19 DIAGNOSIS — Z0000 Encounter for general adult medical examination without abnormal findings: Secondary | ICD-10-CM | POA: Diagnosis not present

## 2017-03-30 IMAGING — DX DG HIP (WITH OR WITHOUT PELVIS) 2-3V*R*
3 series · 5 of 5 positions shown · non-contrast
Comparison: 06/29/2015 and earlier.

CLINICAL DATA: [AGE] male with right hip pain. Possible fall.
Initial encounter.

EXAM:
DG HIP (WITH OR WITHOUT PELVIS) 2-3V RIGHT

[pelvis ap]
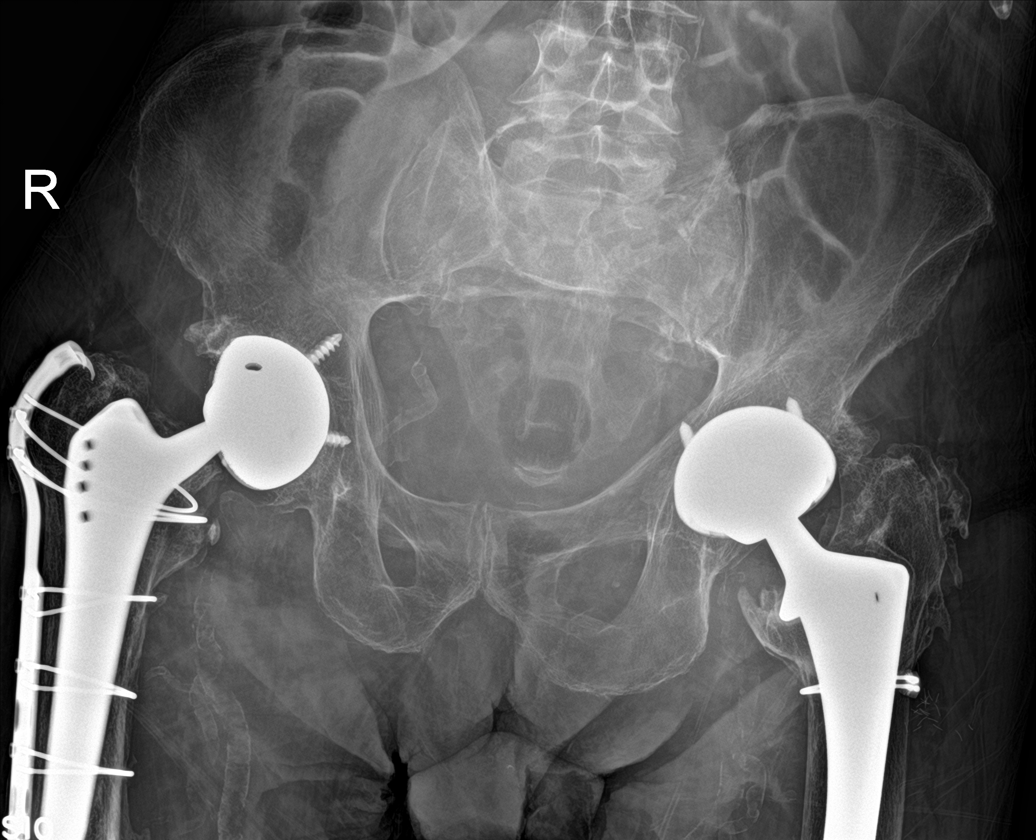

[Series 2: hip ap · 0.14mm/px · 2 of 2 slices shown]
[im 1/2]
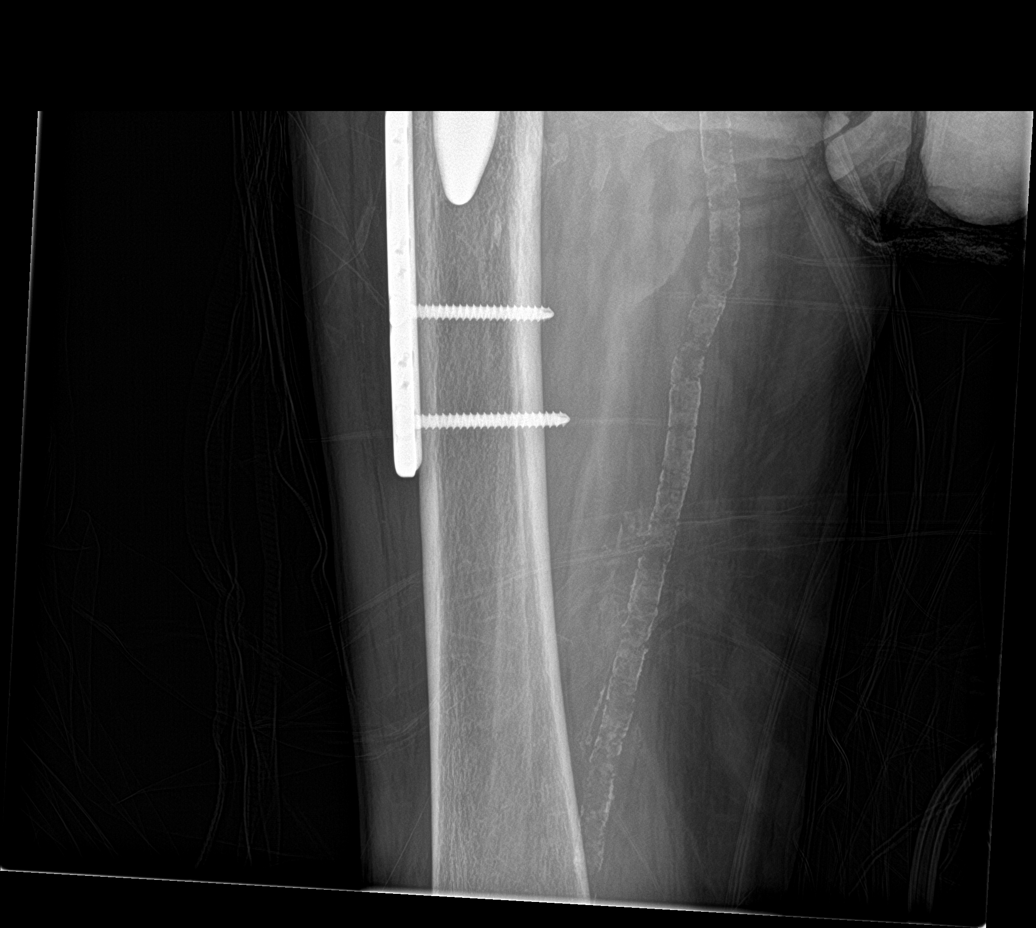
[im 2/2]
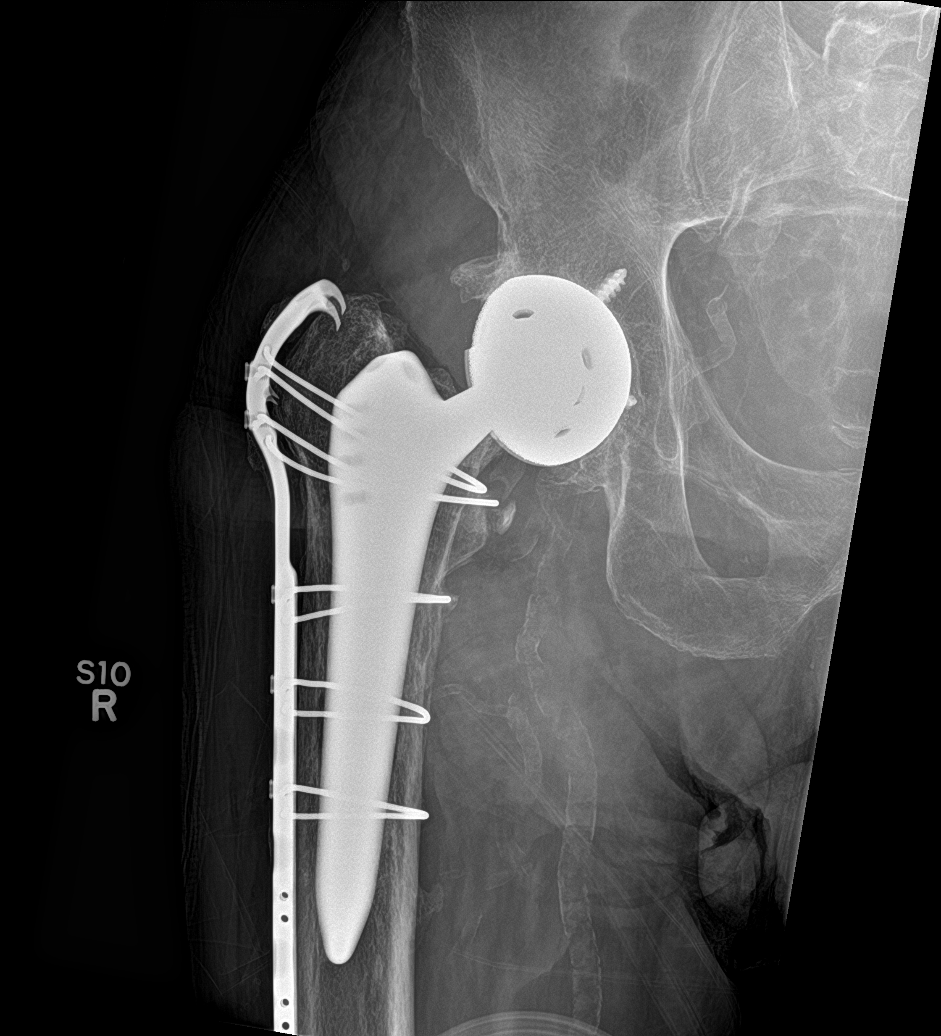

[Series 3: hip lat · 0.14mm/px · 2 of 2 slices shown]
[im 1/2]
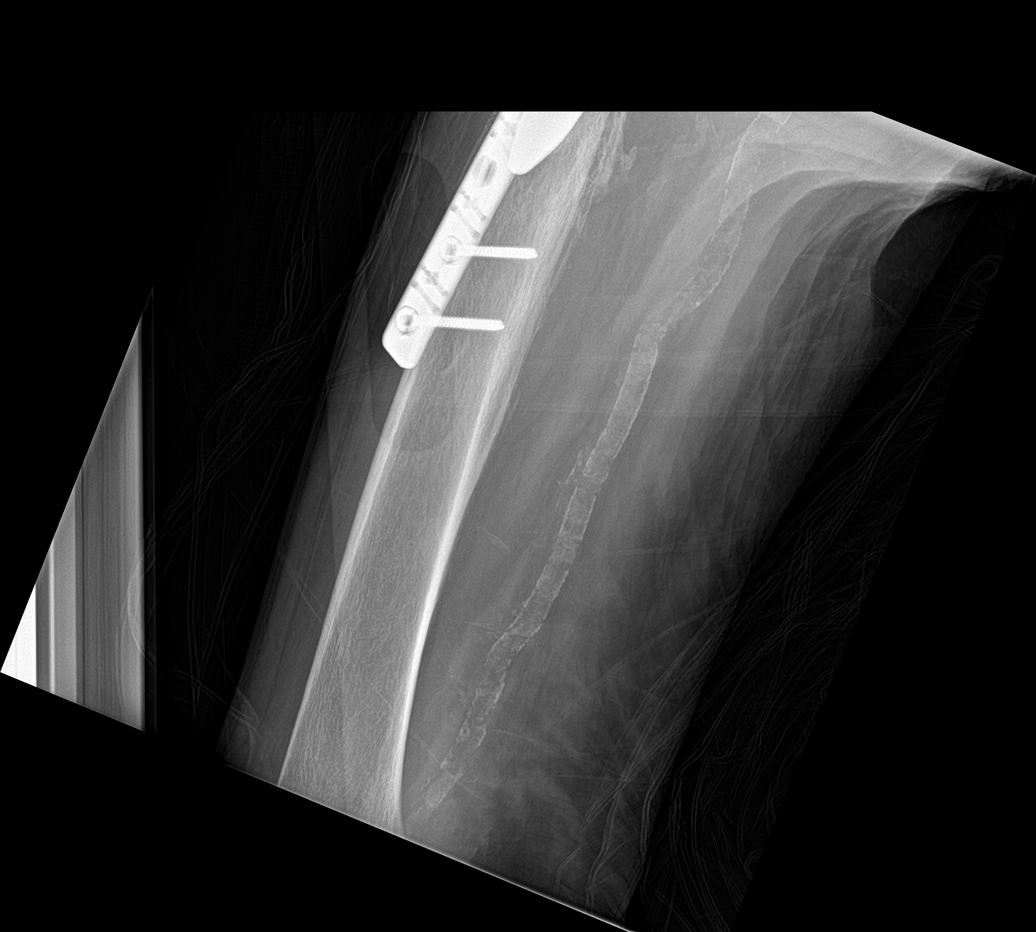
[im 2/2]
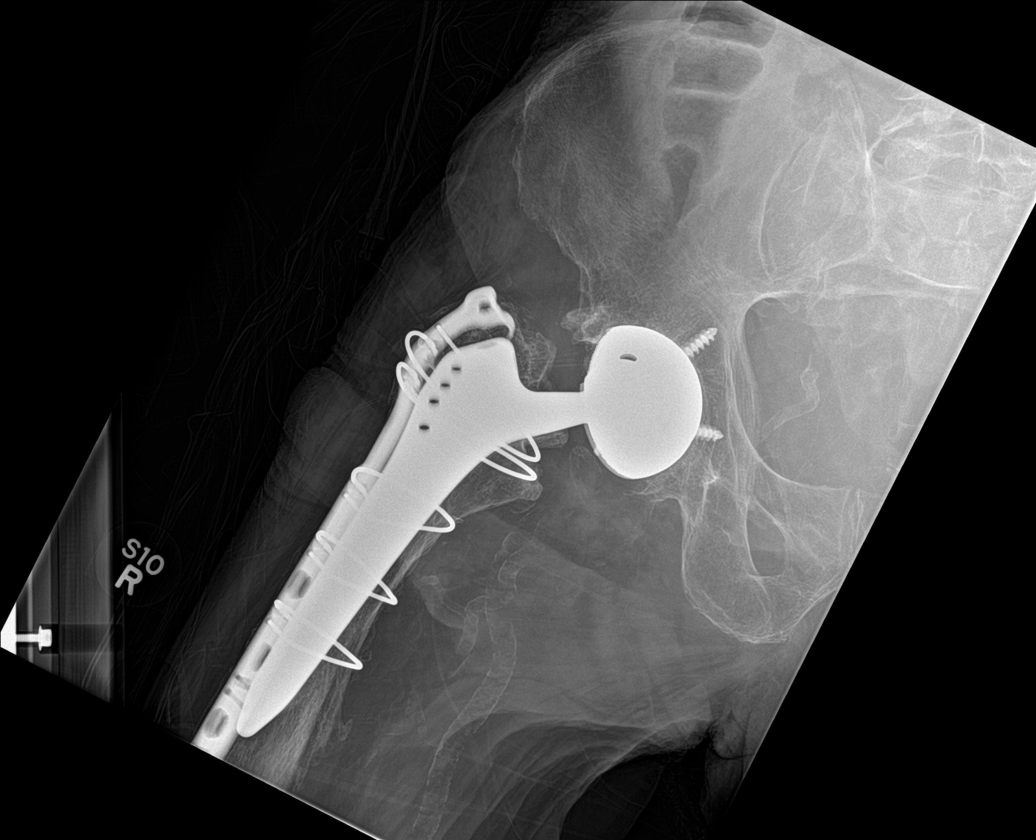

[5 of 5 positions shown; findings below may reference images not displayed]

FINDINGS: Sequelae of bilateral total hip arthroplasty again noted.
Superimposed lateral plate with cortical screws and cerclage wires
about the proximal right femur as before. Right femur and hip
arthroplasty components appear stable and intact. No right hip
hardware loosening identified. Partial visualization of left total
hip arthroplasty hardware. Osteopenia. Extensive iliofemoral
calcified atherosclerosis. No acute pelvic fracture identified.
Negative visible bowel gas pattern.
IMPRESSION: No acute fracture or dislocation identified about the right hip or
pelvis. Osteopenia, prior total hip arthroplasties, and extensive
ORIF hardware about the right hip.

## 2017-03-30 IMAGING — CT CT HEAD W/O CM
4 series · 16 of 47 positions shown, 18 images · non-contrast
Comparison: Head and cervical spine CT 05/05/2016 and earlier.

CLINICAL DATA: [AGE] male status post fall with head injury
over right eye. On blood thinners. Initial encounter.

EXAM:
CT HEAD WITHOUT CONTRAST
TECHNIQUE: Contiguous axial images were obtained from the base of the skull
through the vertex without intravenous contrast.

[Series 2: head without · axial · non-contrast · 0.43mm/px · z∈[+1305,+1425]mm · 7 of 32 slices shown, 9 images]
[im 4/32  brain]
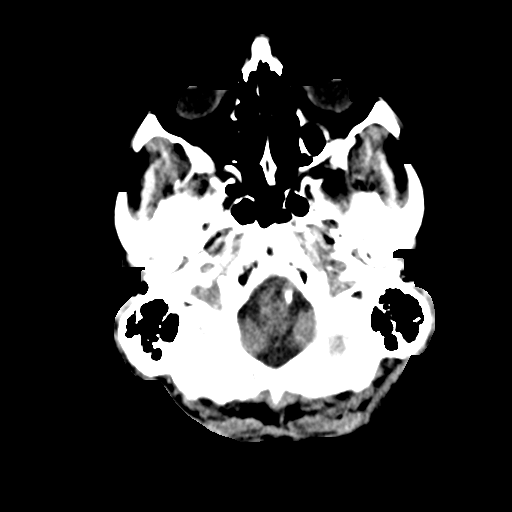
[im 4/32  bone]
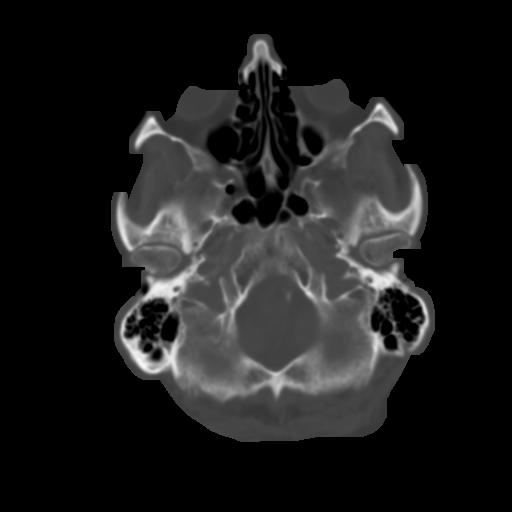
[im 8/32  brain]
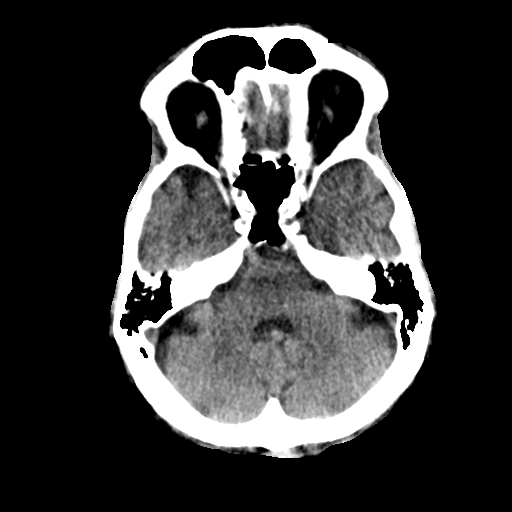
[im 12/32  brain]
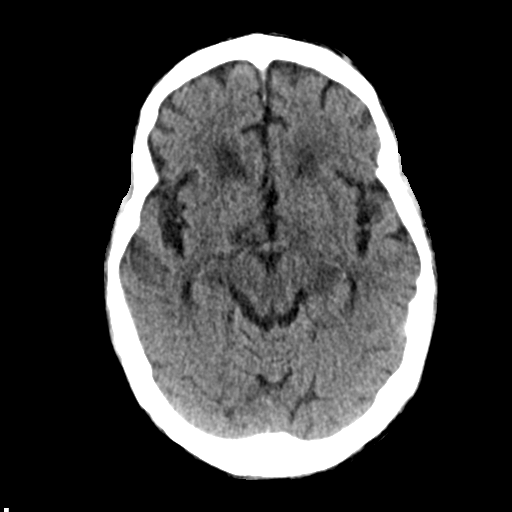
[im 16/32  brain]
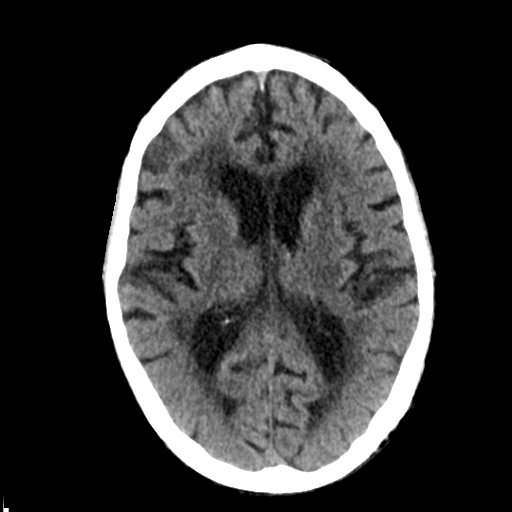
[im 20/32  brain]
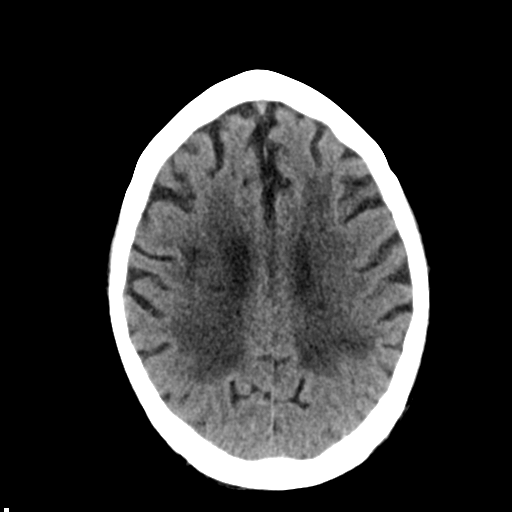
[im 20/32  bone]
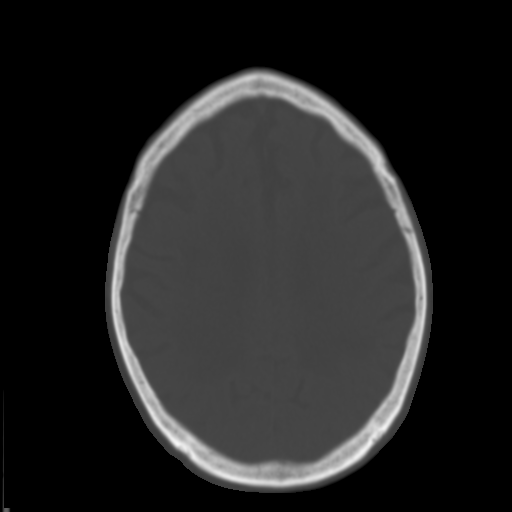
[im 24/32  brain]
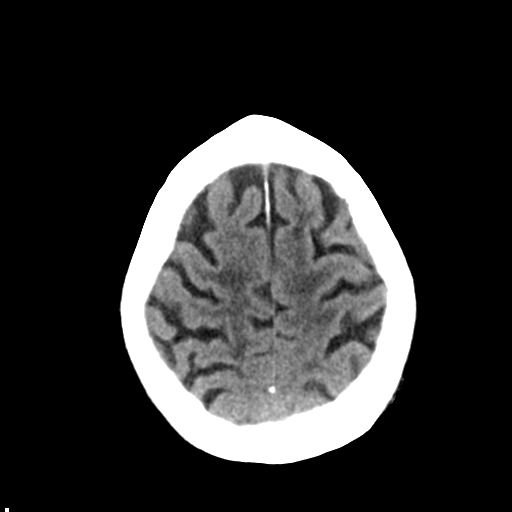
[im 28/32  brain]
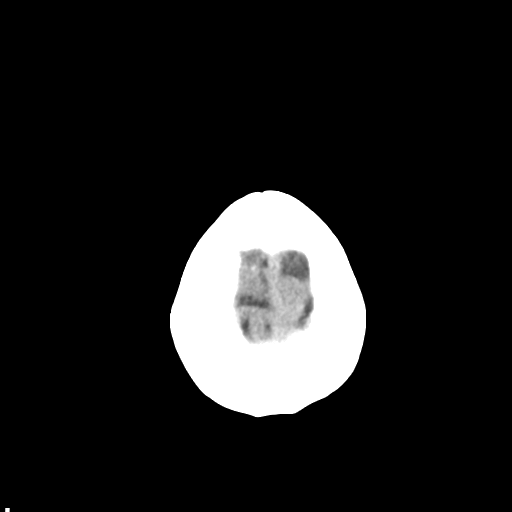

[Series 3: head bone · axial · 0.43mm/px · z∈[+1304,+1336]mm · 3 of 79 slices shown]
[im 8/79  bone]
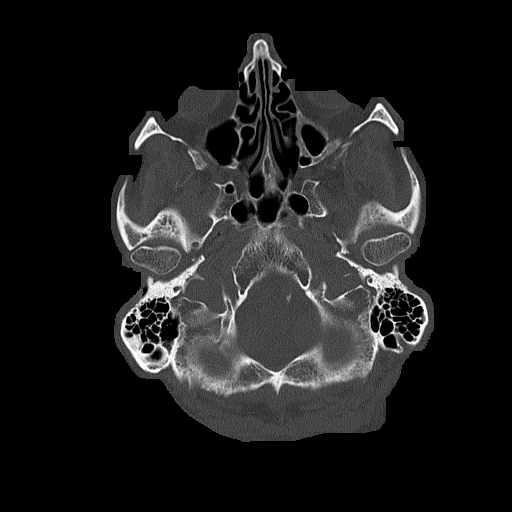
[im 16/79  bone]
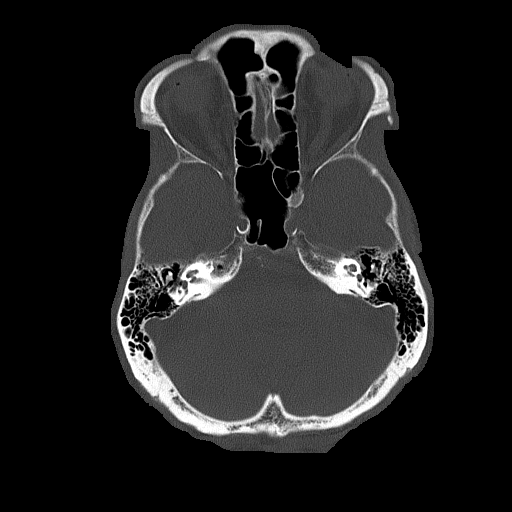
[im 24/79  bone]
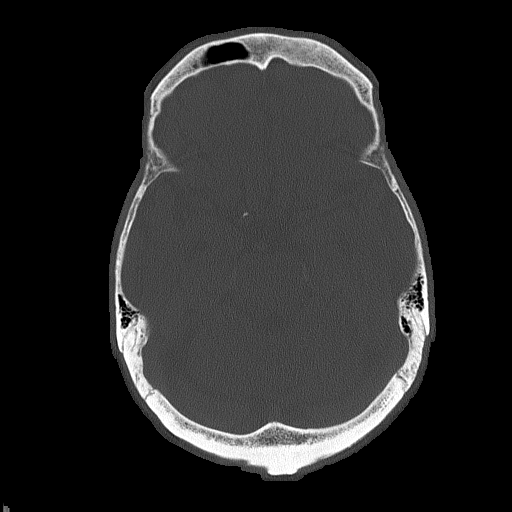

[Series 4: head without cor · coronal · non-contrast · 0.30mm/px · 3 of 66 slices shown]
[im 22/66  brain]
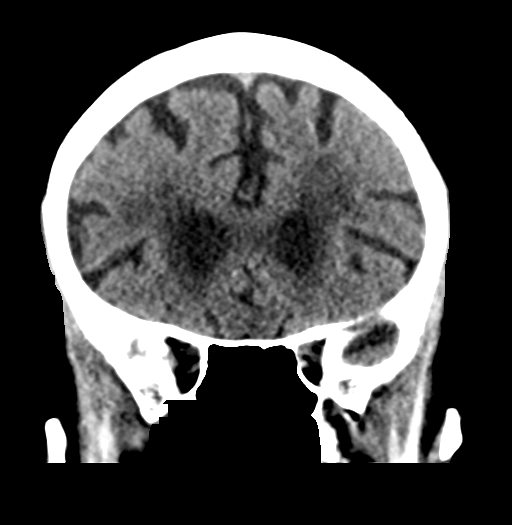
[im 29/66  brain]
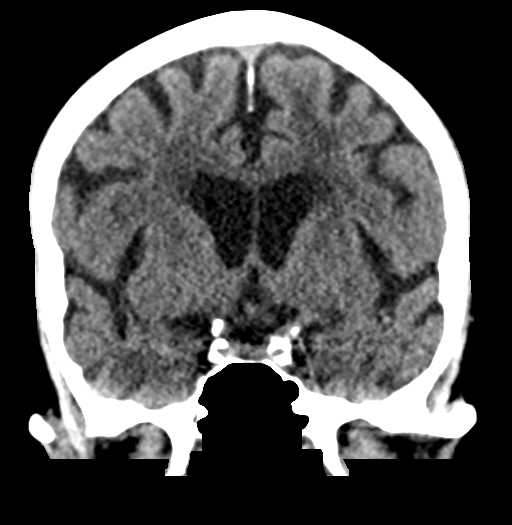
[im 37/66  brain]
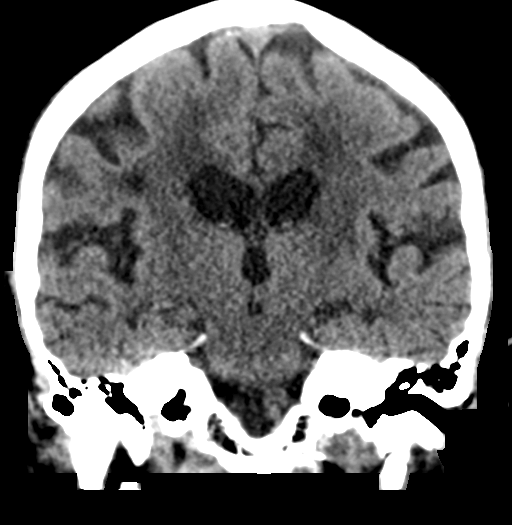

[Series 5: head without sag · sagittal · non-contrast · 0.31mm/px · 3 of 67 slices shown]
[im 23/67  brain]
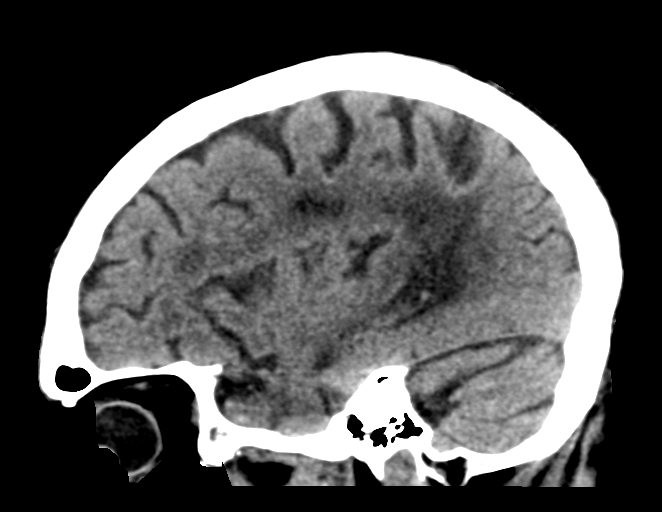
[im 34/67  brain]
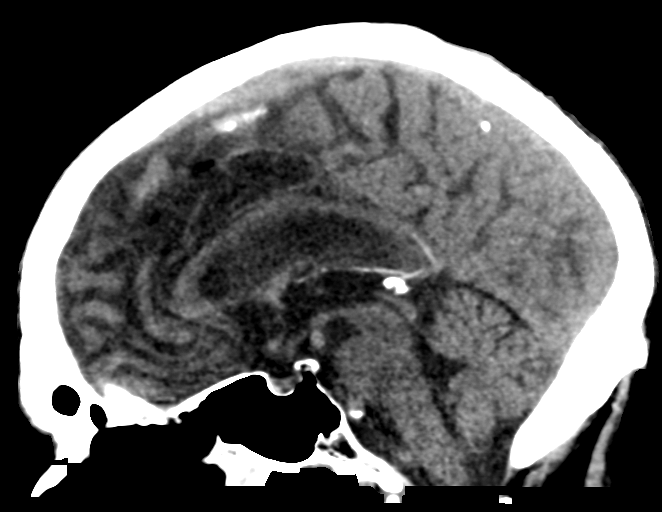
[im 45/67  brain]
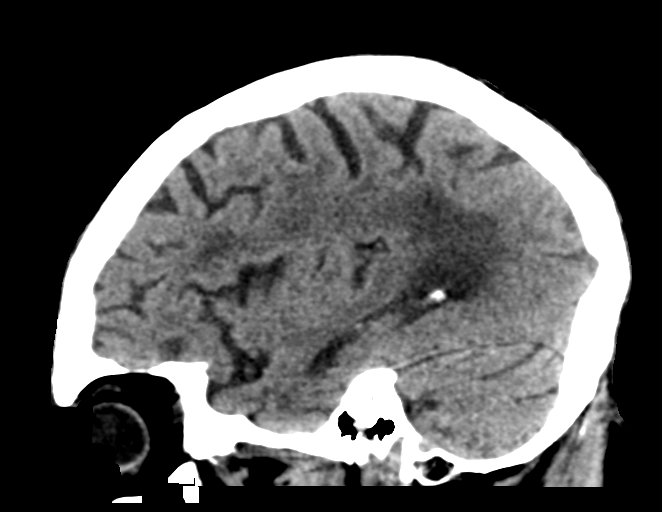

[16 of 47 positions shown; findings below may reference images not displayed]

FINDINGS: Brain: Confluent cerebral white matter hypodensity. Stable cerebral
volume. No midline shift, ventriculomegaly, mass effect, evidence of
mass lesion, intracranial hemorrhage or evidence of cortically based
acute infarction. No cortical encephalomalacia identified.

Vascular: Calcified atherosclerosis at the skull base. No suspicious
intracranial vascular hyperdensity.

Skull: Stable.  No acute osseous abnormality identified.

Sinuses/Orbits: Visualized paranasal sinuses and mastoids are stable
and well pneumatized.

Other: No scalp hematoma. Visualized orbit soft tissues are within
normal limits.
IMPRESSION: Stable non contrast CT appearance of the brain with advanced chronic
white matter disease.

No acute intracranial abnormality or acute traumatic injury
identified.

## 2017-08-18 DIAGNOSIS — M810 Age-related osteoporosis without current pathological fracture: Secondary | ICD-10-CM | POA: Diagnosis not present

## 2017-08-18 DIAGNOSIS — I480 Paroxysmal atrial fibrillation: Secondary | ICD-10-CM | POA: Diagnosis not present

## 2017-08-18 DIAGNOSIS — R7301 Impaired fasting glucose: Secondary | ICD-10-CM | POA: Diagnosis not present

## 2017-08-18 DIAGNOSIS — F329 Major depressive disorder, single episode, unspecified: Secondary | ICD-10-CM | POA: Diagnosis not present

## 2018-02-23 DIAGNOSIS — Z125 Encounter for screening for malignant neoplasm of prostate: Secondary | ICD-10-CM | POA: Diagnosis not present

## 2018-02-23 DIAGNOSIS — D649 Anemia, unspecified: Secondary | ICD-10-CM | POA: Diagnosis not present

## 2018-02-23 DIAGNOSIS — Z79899 Other long term (current) drug therapy: Secondary | ICD-10-CM | POA: Diagnosis not present

## 2018-02-23 DIAGNOSIS — E538 Deficiency of other specified B group vitamins: Secondary | ICD-10-CM | POA: Diagnosis not present

## 2018-02-23 DIAGNOSIS — R82998 Other abnormal findings in urine: Secondary | ICD-10-CM | POA: Diagnosis not present

## 2018-03-02 DIAGNOSIS — I480 Paroxysmal atrial fibrillation: Secondary | ICD-10-CM | POA: Diagnosis not present

## 2018-03-02 DIAGNOSIS — Z0000 Encounter for general adult medical examination without abnormal findings: Secondary | ICD-10-CM | POA: Diagnosis not present

## 2018-03-02 DIAGNOSIS — E559 Vitamin D deficiency, unspecified: Secondary | ICD-10-CM | POA: Diagnosis not present

## 2018-03-02 DIAGNOSIS — R82998 Other abnormal findings in urine: Secondary | ICD-10-CM | POA: Diagnosis not present

## 2018-03-02 DIAGNOSIS — D649 Anemia, unspecified: Secondary | ICD-10-CM | POA: Diagnosis not present

## 2018-03-17 ENCOUNTER — Ambulatory Visit (HOSPITAL_COMMUNITY): Payer: Medicare Other

## 2018-03-23 ENCOUNTER — Ambulatory Visit (HOSPITAL_COMMUNITY)
Admission: RE | Admit: 2018-03-23 | Discharge: 2018-03-23 | Disposition: A | Discharge status: Home / Self Care | Payer: Medicare Other | Source: Ambulatory Visit | Attending: Internal Medicine | Admitting: Internal Medicine

## 2018-03-23 ENCOUNTER — Encounter (HOSPITAL_COMMUNITY): Payer: Self-pay

## 2018-03-23 DIAGNOSIS — M810 Age-related osteoporosis without current pathological fracture: Secondary | ICD-10-CM | POA: Insufficient documentation

## 2018-03-23 MED ORDER — DENOSUMAB 60 MG/ML ~~LOC~~ SOSY
60.0000 mg | PREFILLED_SYRINGE | Freq: Once | SUBCUTANEOUS | Status: AC
  Administered 2018-03-23: 60 mg via SUBCUTANEOUS
  Filled 2018-03-23: qty 1

## 2018-03-23 NOTE — Discharge Instructions (Signed)
Denosumab injection °What is this medicine? °DENOSUMAB (den oh sue mab) slows bone breakdown. Prolia is used to treat osteoporosis in women after menopause and in men. Xgeva is used to treat a high calcium level due to cancer and to prevent bone fractures and other bone problems caused by multiple myeloma or cancer bone metastases. Xgeva is also used to treat giant cell tumor of the bone. °This medicine may be used for other purposes; ask your health care provider or pharmacist if you have questions. °COMMON BRAND NAME(S): Prolia, XGEVA °What should I tell my health care provider before I take this medicine? °They need to know if you have any of these conditions: °-dental disease °-having surgery or tooth extraction °-infection °-kidney disease °-low levels of calcium or Vitamin D in the blood °-malnutrition °-on hemodialysis °-skin conditions or sensitivity °-thyroid or parathyroid disease °-an unusual reaction to denosumab, other medicines, foods, dyes, or preservatives °-pregnant or trying to get pregnant °-breast-feeding °How should I use this medicine? °This medicine is for injection under the skin. It is given by a health care professional in a hospital or clinic setting. °If you are getting Prolia, a special MedGuide will be given to you by the pharmacist with each prescription and refill. Be sure to read this information carefully each time. °For Prolia, talk to your pediatrician regarding the use of this medicine in children. Special care may be needed. For Xgeva, talk to your pediatrician regarding the use of this medicine in children. While this drug may be prescribed for children as young as 13 years for selected conditions, precautions do apply. °Overdosage: If you think you have taken too much of this medicine contact a poison control center or emergency room at once. °NOTE: This medicine is only for you. Do not share this medicine with others. °What if I miss a dose? °It is important not to miss your  dose. Call your doctor or health care professional if you are unable to keep an appointment. °What may interact with this medicine? °Do not take this medicine with any of the following medications: °-other medicines containing denosumab °This medicine may also interact with the following medications: °-medicines that lower your chance of fighting infection °-steroid medicines like prednisone or cortisone °This list may not describe all possible interactions. Give your health care provider a list of all the medicines, herbs, non-prescription drugs, or dietary supplements you use. Also tell them if you smoke, drink alcohol, or use illegal drugs. Some items may interact with your medicine. °What should I watch for while using this medicine? °Visit your doctor or health care professional for regular checks on your progress. Your doctor or health care professional may order blood tests and other tests to see how you are doing. °Call your doctor or health care professional for advice if you get a fever, chills or sore throat, or other symptoms of a cold or flu. Do not treat yourself. This drug may decrease your body's ability to fight infection. Try to avoid being around people who are sick. °You should make sure you get enough calcium and vitamin D while you are taking this medicine, unless your doctor tells you not to. Discuss the foods you eat and the vitamins you take with your health care professional. °See your dentist regularly. Brush and floss your teeth as directed. Before you have any dental work done, tell your dentist you are receiving this medicine. °Do not become pregnant while taking this medicine or for 5 months after stopping   it. Talk with your doctor or health care professional about your birth control options while taking this medicine. Women should inform their doctor if they wish to become pregnant or think they might be pregnant. There is a potential for serious side effects to an unborn child. Talk  to your health care professional or pharmacist for more information. What side effects may I notice from receiving this medicine? Side effects that you should report to your doctor or health care professional as soon as possible: -allergic reactions like skin rash, itching or hives, swelling of the face, lips, or tongue -bone pain -breathing problems -dizziness -jaw pain, especially after dental work -redness, blistering, peeling of the skin -signs and symptoms of infection like fever or chills; cough; sore throat; pain or trouble passing urine -signs of low calcium like fast heartbeat, muscle cramps or muscle pain; pain, tingling, numbness in the hands or feet; seizures -unusual bleeding or bruising -unusually weak or tired Side effects that usually do not require medical attention (report to your doctor or health care professional if they continue or are bothersome): -constipation -diarrhea -headache -joint pain -loss of appetite -muscle pain -runny nose -tiredness -upset stomach This list may not describe all possible side effects. Call your doctor for medical advice about side effects. You may report side effects to FDA at 1-800-FDA-1088. Where should I keep my medicine? This medicine is only given in a clinic, doctor's office, or other health care setting and will not be stored at home. NOTE: This sheet is a summary. It may not cover all possible information. If you have questions about this medicine, talk to your doctor, pharmacist, or health care provider.  2018 [employer] Standard (2016-06-04 19:17:21)

## 2018-06-04 DIAGNOSIS — J189 Pneumonia, unspecified organism: Secondary | ICD-10-CM | POA: Diagnosis not present

## 2018-06-04 DIAGNOSIS — R600 Localized edema: Secondary | ICD-10-CM | POA: Diagnosis not present

## 2018-06-04 DIAGNOSIS — Z6825 Body mass index (BMI) 25.0-25.9, adult: Secondary | ICD-10-CM | POA: Diagnosis not present

## 2018-06-04 DIAGNOSIS — I480 Paroxysmal atrial fibrillation: Secondary | ICD-10-CM | POA: Diagnosis not present

## 2018-09-23 ENCOUNTER — Ambulatory Visit (HOSPITAL_COMMUNITY): Payer: Medicare Other

## 2018-09-23 ENCOUNTER — Encounter (HOSPITAL_COMMUNITY): Payer: Medicare Other

## 2019-04-07 DIAGNOSIS — E559 Vitamin D deficiency, unspecified: Secondary | ICD-10-CM | POA: Diagnosis not present

## 2019-04-07 DIAGNOSIS — E538 Deficiency of other specified B group vitamins: Secondary | ICD-10-CM | POA: Diagnosis not present

## 2019-04-07 DIAGNOSIS — D649 Anemia, unspecified: Secondary | ICD-10-CM | POA: Diagnosis not present

## 2019-04-07 DIAGNOSIS — Z125 Encounter for screening for malignant neoplasm of prostate: Secondary | ICD-10-CM | POA: Diagnosis not present

## 2019-04-13 DIAGNOSIS — Z79899 Other long term (current) drug therapy: Secondary | ICD-10-CM | POA: Diagnosis not present

## 2019-04-14 DIAGNOSIS — E559 Vitamin D deficiency, unspecified: Secondary | ICD-10-CM | POA: Diagnosis not present

## 2019-04-14 DIAGNOSIS — J189 Pneumonia, unspecified organism: Secondary | ICD-10-CM | POA: Diagnosis not present

## 2019-04-14 DIAGNOSIS — Z0000 Encounter for general adult medical examination without abnormal findings: Secondary | ICD-10-CM | POA: Diagnosis not present

## 2019-04-14 DIAGNOSIS — Z1331 Encounter for screening for depression: Secondary | ICD-10-CM | POA: Diagnosis not present

## 2019-04-14 DIAGNOSIS — D649 Anemia, unspecified: Secondary | ICD-10-CM | POA: Diagnosis not present

## 2019-10-19 DIAGNOSIS — R8290 Unspecified abnormal findings in urine: Secondary | ICD-10-CM | POA: Diagnosis not present

## 2019-11-19 DIAGNOSIS — M6281 Muscle weakness (generalized): Secondary | ICD-10-CM | POA: Diagnosis not present

## 2019-11-19 DIAGNOSIS — R262 Difficulty in walking, not elsewhere classified: Secondary | ICD-10-CM | POA: Diagnosis not present

## 2019-11-19 DIAGNOSIS — R2681 Unsteadiness on feet: Secondary | ICD-10-CM | POA: Diagnosis not present

## 2019-11-23 DIAGNOSIS — R2681 Unsteadiness on feet: Secondary | ICD-10-CM | POA: Diagnosis not present

## 2019-11-23 DIAGNOSIS — R262 Difficulty in walking, not elsewhere classified: Secondary | ICD-10-CM | POA: Diagnosis not present

## 2019-11-23 DIAGNOSIS — M6281 Muscle weakness (generalized): Secondary | ICD-10-CM | POA: Diagnosis not present

## 2019-11-24 DIAGNOSIS — R2681 Unsteadiness on feet: Secondary | ICD-10-CM | POA: Diagnosis not present

## 2019-11-24 DIAGNOSIS — R262 Difficulty in walking, not elsewhere classified: Secondary | ICD-10-CM | POA: Diagnosis not present

## 2019-11-24 DIAGNOSIS — M6281 Muscle weakness (generalized): Secondary | ICD-10-CM | POA: Diagnosis not present

## 2019-11-25 DIAGNOSIS — R2681 Unsteadiness on feet: Secondary | ICD-10-CM | POA: Diagnosis not present

## 2019-11-25 DIAGNOSIS — M6281 Muscle weakness (generalized): Secondary | ICD-10-CM | POA: Diagnosis not present

## 2019-11-25 DIAGNOSIS — R262 Difficulty in walking, not elsewhere classified: Secondary | ICD-10-CM | POA: Diagnosis not present

## 2019-11-26 DIAGNOSIS — R262 Difficulty in walking, not elsewhere classified: Secondary | ICD-10-CM | POA: Diagnosis not present

## 2019-11-26 DIAGNOSIS — R2681 Unsteadiness on feet: Secondary | ICD-10-CM | POA: Diagnosis not present

## 2019-11-26 DIAGNOSIS — M6281 Muscle weakness (generalized): Secondary | ICD-10-CM | POA: Diagnosis not present

## 2019-11-29 DIAGNOSIS — R2681 Unsteadiness on feet: Secondary | ICD-10-CM | POA: Diagnosis not present

## 2019-11-29 DIAGNOSIS — M6281 Muscle weakness (generalized): Secondary | ICD-10-CM | POA: Diagnosis not present

## 2019-11-29 DIAGNOSIS — R262 Difficulty in walking, not elsewhere classified: Secondary | ICD-10-CM | POA: Diagnosis not present

## 2019-11-30 DIAGNOSIS — M6281 Muscle weakness (generalized): Secondary | ICD-10-CM | POA: Diagnosis not present

## 2019-12-01 DIAGNOSIS — M6281 Muscle weakness (generalized): Secondary | ICD-10-CM | POA: Diagnosis not present

## 2019-12-01 DIAGNOSIS — R262 Difficulty in walking, not elsewhere classified: Secondary | ICD-10-CM | POA: Diagnosis not present

## 2019-12-01 DIAGNOSIS — R2681 Unsteadiness on feet: Secondary | ICD-10-CM | POA: Diagnosis not present

## 2019-12-03 DIAGNOSIS — R262 Difficulty in walking, not elsewhere classified: Secondary | ICD-10-CM | POA: Diagnosis not present

## 2019-12-03 DIAGNOSIS — M6281 Muscle weakness (generalized): Secondary | ICD-10-CM | POA: Diagnosis not present

## 2019-12-03 DIAGNOSIS — R2681 Unsteadiness on feet: Secondary | ICD-10-CM | POA: Diagnosis not present

## 2019-12-06 DIAGNOSIS — R262 Difficulty in walking, not elsewhere classified: Secondary | ICD-10-CM | POA: Diagnosis not present

## 2019-12-06 DIAGNOSIS — R2681 Unsteadiness on feet: Secondary | ICD-10-CM | POA: Diagnosis not present

## 2019-12-06 DIAGNOSIS — M6281 Muscle weakness (generalized): Secondary | ICD-10-CM | POA: Diagnosis not present

## 2019-12-07 DIAGNOSIS — M6281 Muscle weakness (generalized): Secondary | ICD-10-CM | POA: Diagnosis not present

## 2019-12-08 DIAGNOSIS — R2681 Unsteadiness on feet: Secondary | ICD-10-CM | POA: Diagnosis not present

## 2019-12-08 DIAGNOSIS — M6281 Muscle weakness (generalized): Secondary | ICD-10-CM | POA: Diagnosis not present

## 2019-12-08 DIAGNOSIS — R262 Difficulty in walking, not elsewhere classified: Secondary | ICD-10-CM | POA: Diagnosis not present

## 2019-12-10 DIAGNOSIS — M6281 Muscle weakness (generalized): Secondary | ICD-10-CM | POA: Diagnosis not present

## 2019-12-10 DIAGNOSIS — R2681 Unsteadiness on feet: Secondary | ICD-10-CM | POA: Diagnosis not present

## 2019-12-10 DIAGNOSIS — R262 Difficulty in walking, not elsewhere classified: Secondary | ICD-10-CM | POA: Diagnosis not present

## 2019-12-13 DIAGNOSIS — R2681 Unsteadiness on feet: Secondary | ICD-10-CM | POA: Diagnosis not present

## 2019-12-13 DIAGNOSIS — R262 Difficulty in walking, not elsewhere classified: Secondary | ICD-10-CM | POA: Diagnosis not present

## 2019-12-13 DIAGNOSIS — M6281 Muscle weakness (generalized): Secondary | ICD-10-CM | POA: Diagnosis not present

## 2019-12-14 DIAGNOSIS — M6281 Muscle weakness (generalized): Secondary | ICD-10-CM | POA: Diagnosis not present

## 2019-12-15 DIAGNOSIS — R2681 Unsteadiness on feet: Secondary | ICD-10-CM | POA: Diagnosis not present

## 2019-12-15 DIAGNOSIS — R262 Difficulty in walking, not elsewhere classified: Secondary | ICD-10-CM | POA: Diagnosis not present

## 2019-12-15 DIAGNOSIS — M6281 Muscle weakness (generalized): Secondary | ICD-10-CM | POA: Diagnosis not present

## 2019-12-17 DIAGNOSIS — R262 Difficulty in walking, not elsewhere classified: Secondary | ICD-10-CM | POA: Diagnosis not present

## 2019-12-17 DIAGNOSIS — M6281 Muscle weakness (generalized): Secondary | ICD-10-CM | POA: Diagnosis not present

## 2019-12-17 DIAGNOSIS — R2681 Unsteadiness on feet: Secondary | ICD-10-CM | POA: Diagnosis not present

## 2019-12-20 DIAGNOSIS — R262 Difficulty in walking, not elsewhere classified: Secondary | ICD-10-CM | POA: Diagnosis not present

## 2019-12-20 DIAGNOSIS — M6281 Muscle weakness (generalized): Secondary | ICD-10-CM | POA: Diagnosis not present

## 2019-12-20 DIAGNOSIS — R2681 Unsteadiness on feet: Secondary | ICD-10-CM | POA: Diagnosis not present

## 2019-12-21 DIAGNOSIS — M6281 Muscle weakness (generalized): Secondary | ICD-10-CM | POA: Diagnosis not present

## 2019-12-22 DIAGNOSIS — R262 Difficulty in walking, not elsewhere classified: Secondary | ICD-10-CM | POA: Diagnosis not present

## 2019-12-22 DIAGNOSIS — M6281 Muscle weakness (generalized): Secondary | ICD-10-CM | POA: Diagnosis not present

## 2019-12-22 DIAGNOSIS — R2681 Unsteadiness on feet: Secondary | ICD-10-CM | POA: Diagnosis not present

## 2019-12-23 DIAGNOSIS — R2681 Unsteadiness on feet: Secondary | ICD-10-CM | POA: Diagnosis not present

## 2019-12-23 DIAGNOSIS — M6281 Muscle weakness (generalized): Secondary | ICD-10-CM | POA: Diagnosis not present

## 2019-12-23 DIAGNOSIS — R262 Difficulty in walking, not elsewhere classified: Secondary | ICD-10-CM | POA: Diagnosis not present

## 2019-12-28 DIAGNOSIS — M6281 Muscle weakness (generalized): Secondary | ICD-10-CM | POA: Diagnosis not present

## 2019-12-29 DIAGNOSIS — Z1159 Encounter for screening for other viral diseases: Secondary | ICD-10-CM | POA: Diagnosis not present

## 2019-12-29 DIAGNOSIS — Z20828 Contact with and (suspected) exposure to other viral communicable diseases: Secondary | ICD-10-CM | POA: Diagnosis not present

## 2019-12-30 DIAGNOSIS — M6281 Muscle weakness (generalized): Secondary | ICD-10-CM | POA: Diagnosis not present

## 2020-01-03 DIAGNOSIS — M6281 Muscle weakness (generalized): Secondary | ICD-10-CM | POA: Diagnosis not present

## 2020-01-05 DIAGNOSIS — M6281 Muscle weakness (generalized): Secondary | ICD-10-CM | POA: Diagnosis not present

## 2020-01-05 DIAGNOSIS — Z1159 Encounter for screening for other viral diseases: Secondary | ICD-10-CM | POA: Diagnosis not present

## 2020-01-05 DIAGNOSIS — Z20828 Contact with and (suspected) exposure to other viral communicable diseases: Secondary | ICD-10-CM | POA: Diagnosis not present

## 2020-01-10 DIAGNOSIS — M6281 Muscle weakness (generalized): Secondary | ICD-10-CM | POA: Diagnosis not present

## 2020-01-12 DIAGNOSIS — Z20828 Contact with and (suspected) exposure to other viral communicable diseases: Secondary | ICD-10-CM | POA: Diagnosis not present

## 2020-01-12 DIAGNOSIS — Z1159 Encounter for screening for other viral diseases: Secondary | ICD-10-CM | POA: Diagnosis not present

## 2020-01-12 DIAGNOSIS — M6281 Muscle weakness (generalized): Secondary | ICD-10-CM | POA: Diagnosis not present

## 2020-01-17 DIAGNOSIS — M6281 Muscle weakness (generalized): Secondary | ICD-10-CM | POA: Diagnosis not present

## 2020-01-19 DIAGNOSIS — M6281 Muscle weakness (generalized): Secondary | ICD-10-CM | POA: Diagnosis not present

## 2020-01-24 DIAGNOSIS — M6281 Muscle weakness (generalized): Secondary | ICD-10-CM | POA: Diagnosis not present

## 2020-01-26 DIAGNOSIS — Z20828 Contact with and (suspected) exposure to other viral communicable diseases: Secondary | ICD-10-CM | POA: Diagnosis not present

## 2020-01-26 DIAGNOSIS — M6281 Muscle weakness (generalized): Secondary | ICD-10-CM | POA: Diagnosis not present

## 2020-01-26 DIAGNOSIS — Z1159 Encounter for screening for other viral diseases: Secondary | ICD-10-CM | POA: Diagnosis not present

## 2020-02-02 DIAGNOSIS — Z20828 Contact with and (suspected) exposure to other viral communicable diseases: Secondary | ICD-10-CM | POA: Diagnosis not present

## 2020-02-02 DIAGNOSIS — Z1159 Encounter for screening for other viral diseases: Secondary | ICD-10-CM | POA: Diagnosis not present

## 2020-02-02 DIAGNOSIS — M6281 Muscle weakness (generalized): Secondary | ICD-10-CM | POA: Diagnosis not present

## 2020-02-07 DIAGNOSIS — M6281 Muscle weakness (generalized): Secondary | ICD-10-CM | POA: Diagnosis not present

## 2020-02-09 DIAGNOSIS — Z20828 Contact with and (suspected) exposure to other viral communicable diseases: Secondary | ICD-10-CM | POA: Diagnosis not present

## 2020-02-09 DIAGNOSIS — Z1159 Encounter for screening for other viral diseases: Secondary | ICD-10-CM | POA: Diagnosis not present

## 2020-02-09 DIAGNOSIS — M6281 Muscle weakness (generalized): Secondary | ICD-10-CM | POA: Diagnosis not present

## 2020-02-15 DIAGNOSIS — Z20828 Contact with and (suspected) exposure to other viral communicable diseases: Secondary | ICD-10-CM | POA: Diagnosis not present

## 2020-02-15 DIAGNOSIS — Z1159 Encounter for screening for other viral diseases: Secondary | ICD-10-CM | POA: Diagnosis not present

## 2020-02-18 DIAGNOSIS — Z20828 Contact with and (suspected) exposure to other viral communicable diseases: Secondary | ICD-10-CM | POA: Diagnosis not present

## 2020-02-18 DIAGNOSIS — Z1159 Encounter for screening for other viral diseases: Secondary | ICD-10-CM | POA: Diagnosis not present

## 2020-02-22 DIAGNOSIS — Z20828 Contact with and (suspected) exposure to other viral communicable diseases: Secondary | ICD-10-CM | POA: Diagnosis not present

## 2020-02-22 DIAGNOSIS — Z1159 Encounter for screening for other viral diseases: Secondary | ICD-10-CM | POA: Diagnosis not present

## 2020-04-05 DIAGNOSIS — R82998 Other abnormal findings in urine: Secondary | ICD-10-CM | POA: Diagnosis not present

## 2020-04-28 DIAGNOSIS — Z1159 Encounter for screening for other viral diseases: Secondary | ICD-10-CM | POA: Diagnosis not present

## 2020-04-28 DIAGNOSIS — Z20828 Contact with and (suspected) exposure to other viral communicable diseases: Secondary | ICD-10-CM | POA: Diagnosis not present

## 2020-05-05 DIAGNOSIS — Z1159 Encounter for screening for other viral diseases: Secondary | ICD-10-CM | POA: Diagnosis not present

## 2020-05-05 DIAGNOSIS — Z20828 Contact with and (suspected) exposure to other viral communicable diseases: Secondary | ICD-10-CM | POA: Diagnosis not present

## 2020-05-12 DIAGNOSIS — Z20828 Contact with and (suspected) exposure to other viral communicable diseases: Secondary | ICD-10-CM | POA: Diagnosis not present

## 2020-05-12 DIAGNOSIS — Z1159 Encounter for screening for other viral diseases: Secondary | ICD-10-CM | POA: Diagnosis not present

## 2020-05-21 ENCOUNTER — Inpatient Hospital Stay (HOSPITAL_COMMUNITY)
Admission: EM | Admit: 2020-05-21 | Discharge: 2020-05-26 | DRG: 315 | Disposition: A | Discharge status: Skilled Nursing Facility | Payer: Medicare Other | Attending: Internal Medicine | Admitting: Internal Medicine

## 2020-05-21 ENCOUNTER — Emergency Department (HOSPITAL_COMMUNITY): Payer: Medicare Other

## 2020-05-21 ENCOUNTER — Other Ambulatory Visit: Payer: Self-pay

## 2020-05-21 ENCOUNTER — Inpatient Hospital Stay (HOSPITAL_COMMUNITY): Payer: Medicare Other

## 2020-05-21 DIAGNOSIS — S8002XA Contusion of left knee, initial encounter: Secondary | ICD-10-CM | POA: Diagnosis present

## 2020-05-21 DIAGNOSIS — R000 Tachycardia, unspecified: Secondary | ICD-10-CM | POA: Diagnosis not present

## 2020-05-21 DIAGNOSIS — L89152 Pressure ulcer of sacral region, stage 2: Secondary | ICD-10-CM | POA: Diagnosis present

## 2020-05-21 DIAGNOSIS — M25529 Pain in unspecified elbow: Secondary | ICD-10-CM

## 2020-05-21 DIAGNOSIS — R296 Repeated falls: Secondary | ICD-10-CM | POA: Diagnosis present

## 2020-05-21 DIAGNOSIS — M25512 Pain in left shoulder: Secondary | ICD-10-CM

## 2020-05-21 DIAGNOSIS — R1310 Dysphagia, unspecified: Secondary | ICD-10-CM | POA: Diagnosis present

## 2020-05-21 DIAGNOSIS — E871 Hypo-osmolality and hyponatremia: Secondary | ICD-10-CM | POA: Diagnosis not present

## 2020-05-21 DIAGNOSIS — T68XXXA Hypothermia, initial encounter: Secondary | ICD-10-CM | POA: Diagnosis not present

## 2020-05-21 DIAGNOSIS — N400 Enlarged prostate without lower urinary tract symptoms: Secondary | ICD-10-CM | POA: Diagnosis not present

## 2020-05-21 DIAGNOSIS — M6281 Muscle weakness (generalized): Secondary | ICD-10-CM | POA: Diagnosis not present

## 2020-05-21 DIAGNOSIS — H9190 Unspecified hearing loss, unspecified ear: Secondary | ICD-10-CM | POA: Diagnosis present

## 2020-05-21 DIAGNOSIS — N1831 Chronic kidney disease, stage 3a: Secondary | ICD-10-CM | POA: Diagnosis present

## 2020-05-21 DIAGNOSIS — L8990 Pressure ulcer of unspecified site, unspecified stage: Secondary | ICD-10-CM | POA: Insufficient documentation

## 2020-05-21 DIAGNOSIS — E46 Unspecified protein-calorie malnutrition: Secondary | ICD-10-CM | POA: Diagnosis not present

## 2020-05-21 DIAGNOSIS — Z96643 Presence of artificial hip joint, bilateral: Secondary | ICD-10-CM | POA: Diagnosis not present

## 2020-05-21 DIAGNOSIS — Z96649 Presence of unspecified artificial hip joint: Secondary | ICD-10-CM | POA: Diagnosis not present

## 2020-05-21 DIAGNOSIS — Z833 Family history of diabetes mellitus: Secondary | ICD-10-CM | POA: Diagnosis not present

## 2020-05-21 DIAGNOSIS — I4811 Longstanding persistent atrial fibrillation: Secondary | ICD-10-CM | POA: Diagnosis not present

## 2020-05-21 DIAGNOSIS — M7989 Other specified soft tissue disorders: Secondary | ICD-10-CM | POA: Diagnosis not present

## 2020-05-21 DIAGNOSIS — Z87442 Personal history of urinary calculi: Secondary | ICD-10-CM

## 2020-05-21 DIAGNOSIS — Z8719 Personal history of other diseases of the digestive system: Secondary | ICD-10-CM

## 2020-05-21 DIAGNOSIS — Z79899 Other long term (current) drug therapy: Secondary | ICD-10-CM

## 2020-05-21 DIAGNOSIS — R55 Syncope and collapse: Secondary | ICD-10-CM | POA: Diagnosis present

## 2020-05-21 DIAGNOSIS — D649 Anemia, unspecified: Secondary | ICD-10-CM | POA: Diagnosis not present

## 2020-05-21 DIAGNOSIS — Z7409 Other reduced mobility: Secondary | ICD-10-CM | POA: Diagnosis not present

## 2020-05-21 DIAGNOSIS — W19XXXA Unspecified fall, initial encounter: Secondary | ICD-10-CM

## 2020-05-21 DIAGNOSIS — Z20822 Contact with and (suspected) exposure to covid-19: Secondary | ICD-10-CM | POA: Diagnosis not present

## 2020-05-21 DIAGNOSIS — E538 Deficiency of other specified B group vitamins: Secondary | ICD-10-CM | POA: Diagnosis present

## 2020-05-21 DIAGNOSIS — D539 Nutritional anemia, unspecified: Secondary | ICD-10-CM | POA: Diagnosis present

## 2020-05-21 DIAGNOSIS — I4821 Permanent atrial fibrillation: Secondary | ICD-10-CM | POA: Diagnosis not present

## 2020-05-21 DIAGNOSIS — S51012A Laceration without foreign body of left elbow, initial encounter: Secondary | ICD-10-CM | POA: Diagnosis present

## 2020-05-21 DIAGNOSIS — Z66 Do not resuscitate: Secondary | ICD-10-CM | POA: Diagnosis present

## 2020-05-21 DIAGNOSIS — I4891 Unspecified atrial fibrillation: Secondary | ICD-10-CM | POA: Diagnosis not present

## 2020-05-21 DIAGNOSIS — I959 Hypotension, unspecified: Secondary | ICD-10-CM | POA: Diagnosis not present

## 2020-05-21 DIAGNOSIS — M810 Age-related osteoporosis without current pathological fracture: Secondary | ICD-10-CM | POA: Diagnosis not present

## 2020-05-21 DIAGNOSIS — I4510 Unspecified right bundle-branch block: Secondary | ICD-10-CM | POA: Diagnosis not present

## 2020-05-21 DIAGNOSIS — R1312 Dysphagia, oropharyngeal phase: Secondary | ICD-10-CM | POA: Diagnosis not present

## 2020-05-21 DIAGNOSIS — Z8711 Personal history of peptic ulcer disease: Secondary | ICD-10-CM

## 2020-05-21 DIAGNOSIS — E559 Vitamin D deficiency, unspecified: Secondary | ICD-10-CM | POA: Diagnosis not present

## 2020-05-21 DIAGNOSIS — R41841 Cognitive communication deficit: Secondary | ICD-10-CM | POA: Diagnosis not present

## 2020-05-21 DIAGNOSIS — Z800 Family history of malignant neoplasm of digestive organs: Secondary | ICD-10-CM | POA: Diagnosis not present

## 2020-05-21 DIAGNOSIS — Z043 Encounter for examination and observation following other accident: Secondary | ICD-10-CM | POA: Diagnosis not present

## 2020-05-21 DIAGNOSIS — Z741 Need for assistance with personal care: Secondary | ICD-10-CM | POA: Diagnosis not present

## 2020-05-21 DIAGNOSIS — I482 Chronic atrial fibrillation, unspecified: Secondary | ICD-10-CM | POA: Diagnosis not present

## 2020-05-21 DIAGNOSIS — J439 Emphysema, unspecified: Secondary | ICD-10-CM | POA: Diagnosis present

## 2020-05-21 LAB — URINALYSIS, ROUTINE W REFLEX MICROSCOPIC
Bilirubin Urine: NEGATIVE
Glucose, UA: NEGATIVE mg/dL
Hgb urine dipstick: NEGATIVE
Ketones, ur: NEGATIVE mg/dL
Leukocytes,Ua: NEGATIVE
Nitrite: NEGATIVE
Protein, ur: NEGATIVE mg/dL
Specific Gravity, Urine: >1.03 — ABNORMAL HIGH (ref 1.005–1.030)
pH: 5.5 (ref 5.0–8.0)

## 2020-05-21 LAB — CBC WITH DIFFERENTIAL/PLATELET
Abs Immature Granulocytes: 0.04 10*3/uL (ref 0.00–0.07)
Basophils Absolute: 0 10*3/uL (ref 0.0–0.1)
Basophils Relative: 0 %
Eosinophils Absolute: 0.2 10*3/uL (ref 0.0–0.5)
Eosinophils Relative: 3 %
HCT: 27.6 % — ABNORMAL LOW (ref 39.0–52.0)
Hemoglobin: 9.6 g/dL — ABNORMAL LOW (ref 13.0–17.0)
Immature Granulocytes: 1 %
Lymphocytes Relative: 8 %
Lymphs Abs: 0.5 10*3/uL — ABNORMAL LOW (ref 0.7–4.0)
MCH: 35.2 pg — ABNORMAL HIGH (ref 26.0–34.0)
MCHC: 34.8 g/dL (ref 30.0–36.0)
MCV: 101.1 fL — ABNORMAL HIGH (ref 80.0–100.0)
Monocytes Absolute: 0.3 10*3/uL (ref 0.1–1.0)
Monocytes Relative: 6 %
Neutro Abs: 4.9 10*3/uL (ref 1.7–7.7)
Neutrophils Relative %: 82 %
Platelets: 121 10*3/uL — ABNORMAL LOW (ref 150–400)
RBC: 2.73 MIL/uL — ABNORMAL LOW (ref 4.22–5.81)
RDW: 14.7 % (ref 11.5–15.5)
WBC: 5.9 10*3/uL (ref 4.0–10.5)
nRBC: 0 % (ref 0.0–0.2)

## 2020-05-21 LAB — BASIC METABOLIC PANEL
Anion gap: 10 (ref 5–15)
BUN: 15 mg/dL (ref 8–23)
CO2: 21 mmol/L — ABNORMAL LOW (ref 22–32)
Calcium: 8.4 mg/dL — ABNORMAL LOW (ref 8.9–10.3)
Chloride: 96 mmol/L — ABNORMAL LOW (ref 98–111)
Creatinine, Ser: 1.22 mg/dL (ref 0.61–1.24)
GFR, Estimated: 55 mL/min — ABNORMAL LOW (ref >60)
Glucose, Bld: 114 mg/dL — ABNORMAL HIGH (ref 70–99)
Potassium: 4.5 mmol/L (ref 3.5–5.1)
Sodium: 127 mmol/L — ABNORMAL LOW (ref 135–145)

## 2020-05-21 LAB — HEMOGLOBIN AND HEMATOCRIT, BLOOD
HCT: 23.7 % — ABNORMAL LOW (ref 39.0–52.0)
Hemoglobin: 8.3 g/dL — ABNORMAL LOW (ref 13.0–17.0)

## 2020-05-21 LAB — POC OCCULT BLOOD, ED: Fecal Occult Bld: NEGATIVE

## 2020-05-21 LAB — PROCALCITONIN: Procalcitonin: <0.1 ng/mL

## 2020-05-21 LAB — TSH: TSH: 3.936 u[IU]/mL (ref 0.350–4.500)

## 2020-05-21 LAB — RESP PANEL BY RT-PCR (FLU A&B, COVID) ARPGX2
Influenza A by PCR: NEGATIVE
Influenza B by PCR: NEGATIVE
SARS Coronavirus 2 by RT PCR: NEGATIVE

## 2020-05-21 LAB — LACTIC ACID, PLASMA
Lactic Acid, Venous: 1.3 mmol/L (ref 0.5–1.9)
Lactic Acid, Venous: 1 mmol/L (ref 0.5–1.9)

## 2020-05-21 LAB — TROPONIN I (HIGH SENSITIVITY): Troponin I (High Sensitivity): 23 ng/L — ABNORMAL HIGH (ref <18)

## 2020-05-21 MED ORDER — SODIUM CHLORIDE 0.9 % IV SOLN: 2.0000 g | Freq: Once | INTRAVENOUS | Status: DC

## 2020-05-21 MED ORDER — ONDANSETRON HCL 4 MG/2ML IJ SOLN: 4.0000 mg | Freq: Four times a day (QID) | INTRAMUSCULAR | Status: DC | PRN

## 2020-05-21 MED ORDER — VANCOMYCIN HCL IN DEXTROSE 1-5 GM/200ML-% IV SOLN
1000.0000 mg | Freq: Once | INTRAVENOUS | Status: DC
  Filled 2020-05-21: qty 200

## 2020-05-21 MED ORDER — ONDANSETRON HCL 4 MG PO TABS: 4.0000 mg | ORAL_TABLET | Freq: Four times a day (QID) | ORAL | Status: DC | PRN

## 2020-05-21 MED ORDER — SODIUM CHLORIDE 0.9 % IV BOLUS: 500.0000 mL | Freq: Once | INTRAVENOUS | Status: DC

## 2020-05-21 MED ORDER — SODIUM CHLORIDE 0.9 % IV SOLN
2.0000 g | Freq: Two times a day (BID) | INTRAVENOUS | Status: DC
  Administered 2020-05-22: 2 g via INTRAVENOUS
  Filled 2020-05-21: qty 2

## 2020-05-21 MED ORDER — SODIUM CHLORIDE 0.9 % IV BOLUS: 500.0000 mL | Freq: Once | INTRAVENOUS | 0 refills | Status: DC

## 2020-05-21 MED ORDER — ACETAMINOPHEN 650 MG RE SUPP: 650.0000 mg | Freq: Four times a day (QID) | RECTAL | Status: DC | PRN

## 2020-05-21 MED ORDER — ACETAMINOPHEN 325 MG PO TABS: 650.0000 mg | ORAL_TABLET | Freq: Four times a day (QID) | ORAL | Status: DC | PRN

## 2020-05-21 MED ORDER — SODIUM CHLORIDE 0.9 % IV SOLN
2.0000 g | INTRAVENOUS | Status: AC
  Administered 2020-05-21: 2 g via INTRAVENOUS
  Filled 2020-05-21: qty 2

## 2020-05-21 MED ORDER — VANCOMYCIN HCL 750 MG/150ML IV SOLN: 750.0000 mg | INTRAVENOUS | Status: DC

## 2020-05-21 MED ORDER — SODIUM CHLORIDE 0.9 % IV BOLUS
500.0000 mL | Freq: Once | INTRAVENOUS | Status: AC
  Administered 2020-05-21: 500 mL via INTRAVENOUS

## 2020-05-21 MED ORDER — NOREPINEPHRINE 4 MG/250ML-% IV SOLN
2.0000 ug/min | INTRAVENOUS | Status: DC
  Administered 2020-05-21: 2 ug/min via INTRAVENOUS
  Filled 2020-05-21: qty 250

## 2020-05-21 MED ORDER — SODIUM CHLORIDE 0.9 % IV SOLN
250.0000 mL | INTRAVENOUS | Status: DC
  Administered 2020-05-23: 250 mL via INTRAVENOUS

## 2020-05-21 MED ORDER — SODIUM CHLORIDE 0.9 % IV BOLUS
1000.0000 mL | Freq: Once | INTRAVENOUS | Status: AC
  Administered 2020-05-21: 1000 mL via INTRAVENOUS

## 2020-05-21 MED ORDER — SODIUM CHLORIDE 0.9 % IV SOLN: INTRAVENOUS | Status: AC

## 2020-05-21 MED ORDER — VANCOMYCIN HCL 1250 MG/250ML IV SOLN
1250.0000 mg | INTRAVENOUS | Status: AC
  Administered 2020-05-21: 1250 mg via INTRAVENOUS
  Filled 2020-05-21: qty 250

## 2020-05-21 NOTE — ED Notes (Addendum)
Pt has 2 cm x 4 cm skin tear above left elbow. Stage 1 pressure ulcer 1 cm x 1 cm at left illac crest. Stage 1 pressure ulcer 1 cm x 1 cm mid-upper sacrum. Skin tear 1 cm x 2 cm on exterior right knee. Bruising on lower and mid right rib area.

## 2020-05-21 NOTE — H&P (Signed)
History and Physical    Gerald Sandoval. [id number] DOB: 10-Apr-1924 DOA: 05/21/2020  PCP: Crist Infante, MD  Patient coming from: [facility]  Chief Complaint: Syncope  HPI: Gerald Sandoval. is a 86 y.o. male with medical history significant of Afib, BPH, Hearing loss. History is mostly from chart review/ED staff. After initial interview, patient was able to tell me a little more but not entirely clear. He lives in an independent living facility. He somehow was able to get out of one building yesterday and make it to a sidewalk (He confirms this). We are unclear about what happened next, but he was eventually found down with his wheelchair tipped over this morning. He appeared confused and was sent to the ED.   ED Course: He was worked up in the ED and initial thought to have some level of dementia. Initially thought he could return to his facility. However after checking his temp, they found his was hypothermic. [facility] was called for admission.   Review of Systems:  Unable to obtain d/t mentation.   PMHx Past Medical History:  Diagnosis Date  . Abscess of left hip    infection left hip  . Anemia   . BPH (benign prostatic hyperplasia)   . Colon polyp   . Diastolic dysfunction   . Dysphagia    unspecified  . Emphysema   . Gastric ulcer   . Gastritis   . Hernia    Rt. Inguinial  . History of colonic polyps   . Hypogonadism in male   . Kidney stones   . Leg wound, left   . Osteoarthritis   . Osteoporosis   . Permanent atrial fibrillation   . RBBB (right bundle branch block)   . Rotator cuff injury     PSHx Past Surgical History:  Procedure Laterality Date  . I & D EXTREMITY Left 06/26/2016   Procedure: LEFT LEG IRRIGATION AND DEBRIDEMENT, SPLIT THICKNESS SKIN GRAFT, WOUND VAC;  Surgeon: Leandrew Koyanagi, MD;  Location: [facility];  Service: Orthopedics;  Laterality: Left;  . INGUINAL HERNIA REPAIR     RT  . IRRIGATION AND DEBRIDEMENT HEMATOMA     Left leg   . ORIF PERIPROSTHETIC FRACTURE Right 06/29/2015   Procedure: OPEN REDUCTION INTERNAL FIXATION (ORIF) RIGHT HIP PERIPROSTHETIC FRACTURE;  Surgeon: Leandrew Koyanagi, MD;  Location: [facility];  Service: Orthopedics;  Laterality: Right;  . Resection of Ribs    . SKIN SPLIT GRAFT Left 06/26/2016   Procedure: SKIN GRAFT SPLIT THICKNESS;  Surgeon: Leandrew Koyanagi, MD;  Location: [facility];  Service: Orthopedics;  Laterality: Left;  . TOTAL HIP ARTHROPLASTY     Both hips; Left in 2004, Right in 2007  . TRANSURETHRAL RESECTION OF PROSTATE      SocHx  reports that he has never smoked. He has never used smokeless tobacco. He reports that he does not drink alcohol and does not use drugs.  Allergies  Allergen Reactions  . Hydrocodone-Acetaminophen Other (See Comments)    DELIRIUM, AGITATION, HYPERACTIVITY  . Seroquel [Quetiapine] Other (See Comments)    DELIRIUM, AGITATION, HYPERACTIVITY  . Primidone Other (See Comments)    FamHx Family History  Problem Relation Age of Onset  . Colon cancer Maternal Uncle   . Diabetes Maternal Aunt     Prior to Admission medications   Medication Sig Start Date End Date Taking? Authorizing Provider  cetirizine (ZYRTEC) 10 MG tablet Take 10 mg by mouth daily.  [provider]  cholecalciferol (VITAMIN D) 1000 units tablet Take 2,000 Units by mouth daily.    [provider]  Cyanocobalamin (VITAMIN B-12) 2500 MCG SUBL Place 2,500 mcg under the tongue 2 (two) times daily.     [provider]  ferrous sulfate (IRON SUPPLEMENT) 325 (65 FE) MG tablet Take 325 mg by mouth daily with breakfast.      [provider]  finasteride (PROSCAR) 5 MG tablet Take 5 mg by mouth daily.     [provider]  metoprolol (TOPROL-XL) 50 MG 24 hr tablet Take 1.5 tablets (75 mg total) by mouth daily. 04/15/11   Allred, Jeneen Rinks, MD  mirtazapine (REMERON) 15 MG tablet Take 15 mg by mouth at bedtime.     [provider]  polyethylene glycol  (MIRALAX) packet Take 17 g by mouth daily. 06/28/16   Kinnie Feil, PA-C  senna-docusate (SENOKOT-S) 8.6-50 MG tablet Take 1 tablet by mouth at bedtime. 07/03/15   Thurnell Lose, MD  sulfamethoxazole-trimethoprim (BACTRIM DS,SEPTRA DS) 800-160 MG tablet Take 1 tablet by mouth 2 (two) times daily.    [provider]  tamsulosin (FLOMAX) 0.4 MG CAPS capsule Take 0.4 mg by mouth daily.     [provider]  traMADol (ULTRAM) 50 MG tablet Take 1 tablet (50 mg total) by mouth every 6 (six) hours as needed. Patient taking differently: Take 50 mg by mouth every 6 (six) hours as needed for moderate pain.  06/26/16   Leandrew Koyanagi, MD    Physical Exam: Vitals:   05/21/20 1200 05/21/20 1315 05/21/20 1330 05/21/20 1400  BP:  (!) 82/43 (!) 73/56 (!) 76/41  Pulse: (!) 107     Resp:      Temp: (!) 94.9 F (34.9 C)     TempSrc: Rectal     SpO2: 97%       General: 86 y.o. male resting in bed in NAD Eyes: PERRL, normal sclera ENMT: Nares patent w/o discharge, orophaynx clear, dentition normal, ears w/o discharge/lesions/ulcers Neck: Supple, trachea midline Cardiovascular: irregular, +S1, S2, no m/g/r, equal pulses throughout Respiratory: CTABL, no w/r/r, normal WOB on RA GI: BS+, NDNT, no masses noted, no organomegaly noted MSK: No e/c/c Skin: No rashes, bruises, ulcerations noted Neuro: A&O x 2, limited participation in exam, Hard of hearing Psyc: flat affect, calm/cooperative  Labs on Admission: I have personally reviewed following labs and imaging studies  CBC: Recent Labs  Lab 05/21/20 1036  WBC 5.9  NEUTROABS 4.9  HGB 9.6*  HCT 27.6*  MCV 101.1*  PLT 123XX123*   Basic Metabolic Panel: Recent Labs  Lab 05/21/20 1036  NA 127*  K 4.5  CL 96*  CO2 21*  GLUCOSE 114*  BUN 15  CREATININE 1.22  CALCIUM 8.4*   GFR: CrCl cannot be calculated (Unknown ideal weight.). Liver Function Tests: No results for input(s): AST, ALT, ALKPHOS, BILITOT, PROT, ALBUMIN in the  last 168 hours. No results for input(s): LIPASE, AMYLASE in the last 168 hours. No results for input(s): AMMONIA in the last 168 hours. Coagulation Profile: No results for input(s): INR, PROTIME in the last 168 hours. Cardiac Enzymes: No results for input(s): CKTOTAL, CKMB, CKMBINDEX, TROPONINI in the last 168 hours. BNP (last 3 results) No results for input(s): PROBNP in the last 8760 hours. HbA1C: No results for input(s): HGBA1C in the last 72 hours. CBG: No results for input(s): GLUCAP in the last 168 hours. Lipid Profile: No results for input(s): CHOL, HDL, LDLCALC,  TRIG, CHOLHDL, LDLDIRECT in the last 72 hours. Thyroid Function Tests: No results for input(s): TSH, T4TOTAL, FREET4, T3FREE, THYROIDAB in the last 72 hours. Anemia Panel: No results for input(s): VITAMINB12, FOLATE, FERRITIN, TIBC, IRON, RETICCTPCT in the last 72 hours. Urine analysis:    Component Value Date/Time   COLORURINE YELLOW 06/27/2016 2239   APPEARANCEUR CLEAR 06/27/2016 2239   LABSPEC 1.011 06/27/2016 2239   PHURINE 7.0 06/27/2016 2239   GLUCOSEU NEGATIVE 06/27/2016 2239   HGBUR NEGATIVE 06/27/2016 2239   BILIRUBINUR NEGATIVE 06/27/2016 2239   KETONESUR NEGATIVE 06/27/2016 2239   PROTEINUR NEGATIVE 06/27/2016 2239   NITRITE NEGATIVE 06/27/2016 2239   LEUKOCYTESUR NEGATIVE 06/27/2016 2239    Radiological Exams on Admission: DG Chest 2 View  Result Date: 05/21/2020 CLINICAL DATA:  Found outside. EXAM: CHEST - 2 VIEW COMPARISON:  05/22/2016 FINDINGS: Lucency under the right diaphragm which appears tubular and bounded by folds, attributed to bowel. Chronic cardiomegaly and interstitial coarsening. There is no edema, consolidation, effusion, or pneumothorax. Remote midthoracic compression fracture with advanced height loss. IMPRESSION: No acute finding when compared to prior. Electronically Signed   By: Monte Fantasia M.D.   On: 05/21/2020 08:53   CT Head Wo Contrast  Result Date:  05/21/2020 CLINICAL DATA:  Fall from wheelchair. EXAM: CT HEAD WITHOUT CONTRAST CT CERVICAL SPINE WITHOUT CONTRAST TECHNIQUE: Multidetector CT imaging of the head and cervical spine was performed following the standard protocol without intravenous contrast. Multiplanar CT image reconstructions of the cervical spine were also generated. COMPARISON:  Head CT 06/30/2016.  Cervical spine CT 05/05/2016. FINDINGS: CT HEAD FINDINGS Brain: There is no evidence for acute hemorrhage, hydrocephalus, mass lesion, or abnormal extra-axial fluid collection. No definite CT evidence for acute infarction. Diffuse loss of parenchymal volume is consistent with atrophy. Patchy low attenuation in the deep hemispheric and periventricular white matter is nonspecific, but likely reflects chronic microvascular ischemic demyelination. Vascular: No hyperdense vessel or unexpected calcification. Skull: No evidence for fracture. No worrisome lytic or sclerotic lesion. Sinuses/Orbits: The visualized paranasal sinuses and mastoid air cells are clear. Visualized portions of the globes and intraorbital fat are unremarkable. Other: None. CT CERVICAL SPINE FINDINGS Alignment: Accentuated thoracic lordosis with associated accentuation of upper thoracic kyphosis. No subluxation. Skull base and vertebrae: No evidence for an acute fracture in the bony anatomy of the cervical spine. Compression deformity at T1 and T3 is stable since 2017 exam. Soft tissues and spinal canal: Unremarkable. Disc levels: Loss of disc height noted at C5-6 and to a lesser degree at C4-5 and C6-7. Upper chest: Dependent collapse/consolidative opacity in the right hemithorax with potential pleural effusion. Other: None. IMPRESSION: 1. No acute intracranial abnormality. Atrophy with chronic small vessel white matter ischemic disease. 2. Degenerative changes in the cervical spine without evidence for an acute fracture. 3. Dependent collapse/consolidative opacity in the posterior  right hemithorax with potential pleural effusion. Electronically Signed   By: Misty Stanley M.D.   On: 05/21/2020 08:21   CT Cervical Spine Wo Contrast  Result Date: 05/21/2020 CLINICAL DATA:  Fall from wheelchair. EXAM: CT HEAD WITHOUT CONTRAST CT CERVICAL SPINE WITHOUT CONTRAST TECHNIQUE: Multidetector CT imaging of the head and cervical spine was performed following the standard protocol without intravenous contrast. Multiplanar CT image reconstructions of the cervical spine were also generated. COMPARISON:  Head CT 06/30/2016.  Cervical spine CT 05/05/2016. FINDINGS: CT HEAD FINDINGS Brain: There is no evidence for acute hemorrhage, hydrocephalus, mass lesion, or abnormal extra-axial fluid collection. No definite CT evidence for  acute infarction. Diffuse loss of parenchymal volume is consistent with atrophy. Patchy low attenuation in the deep hemispheric and periventricular white matter is nonspecific, but likely reflects chronic microvascular ischemic demyelination. Vascular: No hyperdense vessel or unexpected calcification. Skull: No evidence for fracture. No worrisome lytic or sclerotic lesion. Sinuses/Orbits: The visualized paranasal sinuses and mastoid air cells are clear. Visualized portions of the globes and intraorbital fat are unremarkable. Other: None. CT CERVICAL SPINE FINDINGS Alignment: Accentuated thoracic lordosis with associated accentuation of upper thoracic kyphosis. No subluxation. Skull base and vertebrae: No evidence for an acute fracture in the bony anatomy of the cervical spine. Compression deformity at T1 and T3 is stable since 2017 exam. Soft tissues and spinal canal: Unremarkable. Disc levels: Loss of disc height noted at C5-6 and to a lesser degree at C4-5 and C6-7. Upper chest: Dependent collapse/consolidative opacity in the right hemithorax with potential pleural effusion. Other: None. IMPRESSION: 1. No acute intracranial abnormality. Atrophy with chronic small vessel white  matter ischemic disease. 2. Degenerative changes in the cervical spine without evidence for an acute fracture. 3. Dependent collapse/consolidative opacity in the posterior right hemithorax with potential pleural effusion. Electronically Signed   By: Misty Stanley M.D.   On: 05/21/2020 08:21   DG Knee Complete 4 Views Left  Result Date: 05/21/2020 CLINICAL DATA:  Fall. EXAM: LEFT KNEE - COMPLETE 4+ VIEW COMPARISON:  None. FINDINGS: No evidence of fracture, dislocation, or joint effusion. No evidence of arthropathy or other focal bone abnormality. Soft tissues are unremarkable. IMPRESSION: Negative. Electronically Signed   By: Misty Stanley M.D.   On: 05/21/2020 08:52    EKG: Independently reviewed. afib  Assessment/Plan AMS SIRS Hypothermia     - admit to inpt, stepdown     - spoke with niece; he is extremely hard of hearing and can seem confused without his hearing aid; she has noticed that he has had garbled speech recently, but denies a diagnosis of dementia     - CTH is negative, LFTs normal     - bair hugger     - check UA/UCx, blood Cx, lactic acid, procal     - fluids     - CXR negative     - qsofa is 2, will initial sepsis protocol  Hyponatremia     - chronicity unknown     - getting bolused d/t hypotension     - follow Na+; check Una+, Uosm  Macrocytic anemia     - no evidence of bleed  CKD3a     - baseline Scr unknown     - getting fluids, watch nephrotoxins, follow UOP  Hx of A fib     - holding metoprolol d/t hypotension  Left elbow pain/abrasion/skin tear     - WOCN  DVT prophylaxis: SCDs  Code Status: DNR, confirmed with neice  Family Communication: Spoke with niece Prudencio Pair)  Consults called: None   Status is: Inpatient  Remains inpatient appropriate because:Inpatient level of care appropriate due to severity of illness   Dispo: The patient is from: Independent living              Anticipated d/c is to: SNF              Anticipated d/c date is: 2  days              Patient currently is not medically stable to d/c.  Gerald Finner DO [facility]  If 7PM-7AM, please contact night-coverage [email or web address]  05/21/2020, 2:29 PM

## 2020-05-21 NOTE — Progress Notes (Signed)
Pharmacy Antibiotic Note  Gerald Sandoval. is a 86 y.o. male admitted on 05/21/2020 with sepsis. Pharmacy has been consulted for Vancomycin and Cefepime dosing.  Plan: Vancomycin 1250mg  IV x 1, then 750mg  IV q24h Vancomycin levels at steady state, as indicated Cefepime 2g IV q12h Monitor renal function, cultures, clinical course  Height: 5\' 6"  (167.6 cm) Weight: 61.2 kg (135 lb) IBW/kg (Calculated) : 63.8  Temp (24hrs), Avg:96.3 F (35.7 C), Min:93.7 F (34.3 C), Max:98.3 F (36.8 C)  Recent Labs  Lab 05/21/20 1036 05/21/20 1508  WBC 5.9  --   CREATININE 1.22  --   LATICACIDVEN  --  1.3    Estimated Creatinine Clearance: 32 mL/min (by C-G formula based on SCr of 1.22 mg/dL).    Allergies  Allergen Reactions  . Hydrocodone-Acetaminophen Other (See Comments)    DELIRIUM, AGITATION, HYPERACTIVITY  . Seroquel [Quetiapine] Other (See Comments)    DELIRIUM, AGITATION, HYPERACTIVITY  . Primidone Other (See Comments)    Antimicrobials this admission: 12/26 Vancomycin >> 12/26 Cefepime >>  Dose adjustments this admission: --  Microbiology results: 12/26 BCx: sent 12/26 UCx: sent  12/26 Respiratory panel: negative   Thank you for allowing pharmacy to be a part of this patient's care.   Lindell Spar, PharmD, BCPS Clinical Pharmacist  05/21/2020 5:42 PM

## 2020-05-21 NOTE — ED Notes (Signed)
ECG given to provider.

## 2020-05-21 NOTE — ED Notes (Addendum)
Pt's BP decreasing and tachycardic. Most recent 88/45. Provider notified via secure chat by this RN and called by unit secretary. Awaiting orders.

## 2020-05-21 NOTE — ED Notes (Signed)
This Probation officer spoke with Gerald Sandoval, [place] [phone], pt's caregiver. Updated her on pt's imaging results and labwork. She would like to be notified if/when pt is being d/c'ed so she can arrange to be there when he returns home.   Gerald Sandoval also said pt's temp runs low and 95.51f is not uncommon for him.

## 2020-05-21 NOTE — ED Notes (Signed)
This RN called [facility] to obtain accurate height and weight, per pharmacy's request. Information added to epic.

## 2020-05-21 NOTE — ED Triage Notes (Signed)
[facility] transported pt from [facility] to [facility] ED and reports the following:  Staff arriving for work at [facility] found the pt outside a side door to the facility. Pt wheelchair was turned on its side and he had fallen out. He was sitting, leaned against part of his wheelchair and concrete parking barrier. Condensation was on his wheelchair. Staff does not know how long he was outside. Pt says he was outside for 1 hour, but EMS thinks longer. [facility] staff does not know how long he was outside. Pt not complaining of pain but wants to be checked out. Scrape on left knee. Not a skin tear. Pt did not have on pants. Pt has difficult hearing but hears the best with his left ear.

## 2020-05-21 NOTE — ED Notes (Signed)
Called [facility] for transport.  

## 2020-05-21 NOTE — ED Notes (Signed)
Previous RN notified this RN that the patients stool was black when they changed his brief earlier in the day. Considering pts hx, NP Randol Kern notified of black stool, low BP, and elevated HR.

## 2020-05-21 NOTE — ED Provider Notes (Addendum)
[facility] DEPT Provider Note   CSN: [id number] Arrival date & time: 05/21/20  [id number]     History Chief Complaint  Patient presents with  . Fall    Gerald Lamere Pressnell Brooke Bonito. is a 86 y.o. male.  Patient with history of A. fib but not on blood thinners who presents the ED after being found down at his nursing home.  Patient was outside and lying on the floor next to his wheelchair.  Patient was awake and appeared to be at his baseline.  Patient is hard of hearing.  He denies any extremity pain.  No headaches.  No neck pain.  Cannot give me much details about how he fell or how he got outside.  The history is provided by the patient.  Fall This is a new problem. The current episode started 1 to 2 hours ago. The problem occurs rarely. The problem has been resolved. Pertinent negatives include no chest pain, no abdominal pain, no headaches and no shortness of breath. Nothing aggravates the symptoms. Nothing relieves the symptoms. He has tried nothing for the symptoms. The treatment provided no relief.       Past Medical History:  Diagnosis Date  . Abscess of left hip    infection left hip  . Anemia   . BPH (benign prostatic hyperplasia)   . Colon polyp   . Diastolic dysfunction   . Dysphagia    unspecified  . Emphysema   . Gastric ulcer   . Gastritis   . Hernia    Rt. Inguinial  . History of colonic polyps   . Hypogonadism in male   . Kidney stones   . Leg wound, left   . Osteoarthritis   . Osteoporosis   . Permanent atrial fibrillation   . RBBB (right bundle branch block)   . Rotator cuff injury     Patient Active Problem List   Diagnosis Date Noted  . Wound of left leg   . Protein-calorie malnutrition, severe 05/08/2016  . Acute encephalopathy 05/07/2016  . Hematoma of leg, left, initial encounter 05/06/2016  . Hyperglycemia 05/06/2016  . Chronic anemia 05/06/2016  . AKI (acute kidney injury) ([place]) 05/05/2016  . Persistent atrial  fibrillation ([place])   . Chronic anticoagulation-Pradaxa prior to admission 06/28/2015  . RBBB 06/28/2015  . DVT of Rt lower limb, acute ([place]) 06/28/2015  . S/P IVC filter 06/27/15 06/28/2015  . S/P Lt hip replacement, chronically infected [facility]) 06/28/2015  . Lactic acidosis   . Encounter for palliative care   . Goals of care, counseling/discussion   . Fracture - Rt hip prosthesis this adm   . Severe sepsis- found down at nursing home 06/25/2015  . Sepsis ([place]) 06/25/2015  . Edema of right lower extremity 06/25/2015  . History of right hip replacement 06/25/2015  . Hyperkalemia 06/25/2015  . Metabolic acidosis [id number]  . EMPHYSEMA 08/03/2009  . Unilateral primary osteoarthritis, right knee 08/03/2009  . ARTHRITIS 08/03/2009  . GASTRIC ULCER 06/02/2009  . GASTRITIS 06/02/2009  . COLONIC POLYPS 05/03/2009  . DYSPHAGIA UNSPECIFIED 05/03/2009    Past Surgical History:  Procedure Laterality Date  . I & D EXTREMITY Left 06/26/2016   Procedure: LEFT LEG IRRIGATION AND DEBRIDEMENT, SPLIT THICKNESS SKIN GRAFT, WOUND VAC;  Surgeon: Leandrew Koyanagi, MD;  Location: [facility];  Service: Orthopedics;  Laterality: Left;  . INGUINAL HERNIA REPAIR     RT  . IRRIGATION AND DEBRIDEMENT HEMATOMA     Left leg  . ORIF  PERIPROSTHETIC FRACTURE Right 06/29/2015   Procedure: OPEN REDUCTION INTERNAL FIXATION (ORIF) RIGHT HIP PERIPROSTHETIC FRACTURE;  Surgeon: Leandrew Koyanagi, MD;  Location: [facility];  Service: Orthopedics;  Laterality: Right;  . Resection of Ribs    . SKIN SPLIT GRAFT Left 06/26/2016   Procedure: SKIN GRAFT SPLIT THICKNESS;  Surgeon: Leandrew Koyanagi, MD;  Location: [facility];  Service: Orthopedics;  Laterality: Left;  . TOTAL HIP ARTHROPLASTY     Both hips; Left in 2004, Right in 2007  . TRANSURETHRAL RESECTION OF PROSTATE         Family History  Problem Relation Age of Onset  . Colon cancer Maternal Uncle   . Diabetes Maternal Aunt     Social History   Tobacco Use  . Smoking status: Never  Smoker  . Smokeless tobacco: Never Used  Substance Use Topics  . Alcohol use: No    Alcohol/week: 0.0 standard drinks  . Drug use: No    Home Medications Prior to Admission medications   Medication Sig Start Date End Date Taking? Authorizing Provider  cetirizine (ZYRTEC) 10 MG tablet Take 10 mg by mouth daily.     [provider]  cholecalciferol (VITAMIN D) 1000 units tablet Take 2,000 Units by mouth daily.    [provider]  Cyanocobalamin (VITAMIN B-12) 2500 MCG SUBL Place 2,500 mcg under the tongue 2 (two) times daily.     [provider]  ferrous sulfate (IRON SUPPLEMENT) 325 (65 FE) MG tablet Take 325 mg by mouth daily with breakfast.      [provider]  finasteride (PROSCAR) 5 MG tablet Take 5 mg by mouth daily.     [provider]  metoprolol (TOPROL-XL) 50 MG 24 hr tablet Take 1.5 tablets (75 mg total) by mouth daily. 04/15/11   Allred, Jeneen Rinks, MD  mirtazapine (REMERON) 15 MG tablet Take 15 mg by mouth at bedtime.     [provider]  polyethylene glycol (MIRALAX) packet Take 17 g by mouth daily. 06/28/16   Kinnie Feil, PA-C  senna-docusate (SENOKOT-S) 8.6-50 MG tablet Take 1 tablet by mouth at bedtime. 07/03/15   Thurnell Lose, MD  sulfamethoxazole-trimethoprim (BACTRIM DS,SEPTRA DS) 800-160 MG tablet Take 1 tablet by mouth 2 (two) times daily.    [provider]  tamsulosin (FLOMAX) 0.4 MG CAPS capsule Take 0.4 mg by mouth daily.     [provider]  traMADol (ULTRAM) 50 MG tablet Take 1 tablet (50 mg total) by mouth every 6 (six) hours as needed. Patient taking differently: Take 50 mg by mouth every 6 (six) hours as needed for moderate pain.  06/26/16   Leandrew Koyanagi, MD    Allergies    Hydrocodone-acetaminophen, Seroquel [quetiapine], and Primidone  Review of Systems   Review of Systems  Constitutional: Negative for chills and fever.  HENT: Negative for ear pain and sore throat.   Eyes:  Negative for pain and visual disturbance.  Respiratory: Negative for cough and shortness of breath.   Cardiovascular: Negative for chest pain and palpitations.  Gastrointestinal: Negative for abdominal pain and vomiting.  Genitourinary: Negative for dysuria and hematuria.  Musculoskeletal: Negative for arthralgias and back pain.  Skin: Negative for color change and rash.  Neurological: Negative for seizures, syncope and headaches.  All other systems reviewed and are negative.   Physical Exam Updated Vital Signs BP 100/60   Pulse (!) 34   Temp (!) 94.9 F (34.9 C) (Rectal)   Resp 16  SpO2 96%   Physical Exam Vitals and nursing note reviewed.  Constitutional:      General: He is not in acute distress.    Appearance: He is well-developed and well-nourished. He is not ill-appearing.  HENT:     Head: Normocephalic and atraumatic.     Nose: Nose normal.     Mouth/Throat:     Mouth: Mucous membranes are moist.  Eyes:     Extraocular Movements: Extraocular movements intact.     Conjunctiva/sclera: Conjunctivae normal.     Pupils: Pupils are equal, round, and reactive to light.  Cardiovascular:     Rate and Rhythm: Normal rate and regular rhythm.     Pulses: Normal pulses.     Heart sounds: Normal heart sounds. No murmur heard.   Pulmonary:     Effort: Pulmonary effort is normal. No respiratory distress.     Breath sounds: Normal breath sounds.  Abdominal:     Palpations: Abdomen is soft.     Tenderness: There is no abdominal tenderness.  Musculoskeletal:        General: No tenderness or edema.     Cervical back: Normal range of motion and neck supple. No tenderness.  Skin:    General: Skin is warm and dry.     Findings: Bruising (left knee) present.  Neurological:     General: No focal deficit present.     Mental Status: He is alert.     Sensory: No sensory deficit.     Motor: No weakness.  Psychiatric:        Mood and Affect: Mood and affect normal.     ED  Results / Procedures / Treatments   Labs (all labs ordered are listed, but only abnormal results are displayed) Labs Reviewed  CBC WITH DIFFERENTIAL/PLATELET - Abnormal; Notable for the following components:      Result Value   RBC 2.73 (*)    Hemoglobin 9.6 (*)    HCT 27.6 (*)    MCV 101.1 (*)    MCH 35.2 (*)    Platelets 121 (*)    Lymphs Abs 0.5 (*)    All other components within normal limits  BASIC METABOLIC PANEL - Abnormal; Notable for the following components:   Sodium 127 (*)    Chloride 96 (*)    CO2 21 (*)    Glucose, Bld 114 (*)    Calcium 8.4 (*)    GFR, Estimated 55 (*)    All other components within normal limits  RESP PANEL BY RT-PCR (FLU A&B, COVID) [id number]  URINALYSIS, ROUTINE W REFLEX MICROSCOPIC    EKG EKG Interpretation  Date/Time:  Sunday May 21 2020 11:57:37 EST Ventricular Rate:  100 PR Interval:    QRS Duration: 150 QT Interval:  364 QTC Calculation: 469 R Axis:   -45 Text Interpretation: Atrial fibrillation with premature ventricular or aberrantly conducted complexes Right bundle branch block No significant change since last tracing Confirmed by Lennice Sites [phone]) on 05/21/2020 12:07:47 PM   Radiology DG Chest 2 View  Result Date: 05/21/2020 CLINICAL DATA:  Found outside. EXAM: CHEST - 2 VIEW COMPARISON:  05/22/2016 FINDINGS: Lucency under the right diaphragm which appears tubular and bounded by folds, attributed to bowel. Chronic cardiomegaly and interstitial coarsening. There is no edema, consolidation, effusion, or pneumothorax. Remote midthoracic compression fracture with advanced height loss. IMPRESSION: No acute finding when compared to prior. Electronically Signed   By: Monte Fantasia M.D.   On: 05/21/2020 08:53   CT  Head Wo Contrast  Result Date: 05/21/2020 CLINICAL DATA:  Fall from wheelchair. EXAM: CT HEAD WITHOUT CONTRAST CT CERVICAL SPINE WITHOUT CONTRAST TECHNIQUE: Multidetector CT imaging of the head and cervical spine  was performed following the standard protocol without intravenous contrast. Multiplanar CT image reconstructions of the cervical spine were also generated. COMPARISON:  Head CT 06/30/2016.  Cervical spine CT 05/05/2016. FINDINGS: CT HEAD FINDINGS Brain: There is no evidence for acute hemorrhage, hydrocephalus, mass lesion, or abnormal extra-axial fluid collection. No definite CT evidence for acute infarction. Diffuse loss of parenchymal volume is consistent with atrophy. Patchy low attenuation in the deep hemispheric and periventricular white matter is nonspecific, but likely reflects chronic microvascular ischemic demyelination. Vascular: No hyperdense vessel or unexpected calcification. Skull: No evidence for fracture. No worrisome lytic or sclerotic lesion. Sinuses/Orbits: The visualized paranasal sinuses and mastoid air cells are clear. Visualized portions of the globes and intraorbital fat are unremarkable. Other: None. CT CERVICAL SPINE FINDINGS Alignment: Accentuated thoracic lordosis with associated accentuation of upper thoracic kyphosis. No subluxation. Skull base and vertebrae: No evidence for an acute fracture in the bony anatomy of the cervical spine. Compression deformity at T1 and T3 is stable since 2017 exam. Soft tissues and spinal canal: Unremarkable. Disc levels: Loss of disc height noted at C5-6 and to a lesser degree at C4-5 and C6-7. Upper chest: Dependent collapse/consolidative opacity in the right hemithorax with potential pleural effusion. Other: None. IMPRESSION: 1. No acute intracranial abnormality. Atrophy with chronic small vessel white matter ischemic disease. 2. Degenerative changes in the cervical spine without evidence for an acute fracture. 3. Dependent collapse/consolidative opacity in the posterior right hemithorax with potential pleural effusion. Electronically Signed   By: Misty Stanley M.D.   On: 05/21/2020 08:21   CT Cervical Spine Wo Contrast  Result Date:  05/21/2020 CLINICAL DATA:  Fall from wheelchair. EXAM: CT HEAD WITHOUT CONTRAST CT CERVICAL SPINE WITHOUT CONTRAST TECHNIQUE: Multidetector CT imaging of the head and cervical spine was performed following the standard protocol without intravenous contrast. Multiplanar CT image reconstructions of the cervical spine were also generated. COMPARISON:  Head CT 06/30/2016.  Cervical spine CT 05/05/2016. FINDINGS: CT HEAD FINDINGS Brain: There is no evidence for acute hemorrhage, hydrocephalus, mass lesion, or abnormal extra-axial fluid collection. No definite CT evidence for acute infarction. Diffuse loss of parenchymal volume is consistent with atrophy. Patchy low attenuation in the deep hemispheric and periventricular white matter is nonspecific, but likely reflects chronic microvascular ischemic demyelination. Vascular: No hyperdense vessel or unexpected calcification. Skull: No evidence for fracture. No worrisome lytic or sclerotic lesion. Sinuses/Orbits: The visualized paranasal sinuses and mastoid air cells are clear. Visualized portions of the globes and intraorbital fat are unremarkable. Other: None. CT CERVICAL SPINE FINDINGS Alignment: Accentuated thoracic lordosis with associated accentuation of upper thoracic kyphosis. No subluxation. Skull base and vertebrae: No evidence for an acute fracture in the bony anatomy of the cervical spine. Compression deformity at T1 and T3 is stable since 2017 exam. Soft tissues and spinal canal: Unremarkable. Disc levels: Loss of disc height noted at C5-6 and to a lesser degree at C4-5 and C6-7. Upper chest: Dependent collapse/consolidative opacity in the right hemithorax with potential pleural effusion. Other: None. IMPRESSION: 1. No acute intracranial abnormality. Atrophy with chronic small vessel white matter ischemic disease. 2. Degenerative changes in the cervical spine without evidence for an acute fracture. 3. Dependent collapse/consolidative opacity in the posterior  right hemithorax with potential pleural effusion. Electronically Signed   By: Randall Hiss  Tery Sanfilippo M.D.   On: 05/21/2020 08:21   DG Knee Complete 4 Views Left  Result Date: 05/21/2020 CLINICAL DATA:  Fall. EXAM: LEFT KNEE - COMPLETE 4+ VIEW COMPARISON:  None. FINDINGS: No evidence of fracture, dislocation, or joint effusion. No evidence of arthropathy or other focal bone abnormality. Soft tissues are unremarkable. IMPRESSION: Negative. Electronically Signed   By: Misty Stanley M.D.   On: 05/21/2020 08:52    Procedures Procedures (including critical care time)  Medications Ordered in ED Medications  sodium chloride 0.9 % bolus 500 mL (0 mLs Intravenous Stopped 05/21/20 1216)    ED Course  I have reviewed the triage vital signs and the nursing notes.  Pertinent labs & imaging results that were available during my care of the patient were reviewed by me and considered in my medical decision making (see chart for details).    MDM Rules/Calculators/A&P                          Gerald Kluever. is a 86 year old male with history of A. fib not on blood thinners who presents to the ED after unwitnessed fall at nursing home.  Normal vitals.  No fever.  Overall appears pleasant.  Patient is very hard of hearing but denies any headache, neck pain, extremity pain.  There is bruising to the left knee.  No documented history of dementia but suspect some memory issues.  Does not remember much of the details about how he ended up outside and on the floor.  Will get a CT scan of patient's head, neck, left knee. Patient also hypothermic likely due to being outside in cold exposure.  Will place on bear hugger.  Will check basic labs.  Lab work showed sodium of 127.  Otherwise unremarkable.  CT imaging unremarkable.  X-ray of the left knee unremarkable.  Patient still fairly hypothermic after several hours on the bear hugger.  Sodium is low.  Overall, I talked with facility and patient lives in independent living  and only has care some time during the day but does not have 24/7 care.  He is wheelchair-bound.  He somehow made it outside and was found outside without any clothes on for an unknown amount of time.  Do not believe it is safe for the patient to leave as he is still hypothermic and may be slightly confused may be secondary to hyponatremia but overall could be close to his baseline.  Was unable to touch base with his power of attorney.  Patient is hard of hearing and facility thinks that he does have some memory and dementia issues as well.  They do not believe he is safe either with the amount of supervised care that he gets.  Overall given the clinical picture believe it is safe for patient to be admitted and have evaluation for physical therapy and OT and likely placed in a more full-time care facility.  This chart was dictated using voice recognition software.  Despite best efforts to proofread,  errors can occur which can change the documentation meaning.   Final Clinical Impression(s) / ED Diagnoses Final diagnoses:  Fall, initial encounter  Hyponatremia  Hypothermia, initial encounter    Rx / DC Orders ED Discharge Orders         Ordered    sodium chloride 0.9 %   Once,   Status:  Discontinued        05/21/20 1021  Lennice Sites, DO 05/21/20 [id number]    Lennice Sites, DO 05/21/20 1348

## 2020-05-21 NOTE — ED Notes (Signed)
This RN hung 553ml NSS and administered to patient while awaiting orders from provider.

## 2020-05-21 NOTE — ED Notes (Signed)
[facility]AR arrived pt rectal temp 93.3. Pt will not be discharged at this time.

## 2020-05-21 NOTE — ED Notes (Addendum)
Social research officer, government warmer

## 2020-05-21 NOTE — ED Notes (Signed)
Called lab to add on urine osmo and urine sodium.

## 2020-05-22 DIAGNOSIS — I959 Hypotension, unspecified: Secondary | ICD-10-CM | POA: Diagnosis not present

## 2020-05-22 DIAGNOSIS — I4811 Longstanding persistent atrial fibrillation: Secondary | ICD-10-CM

## 2020-05-22 DIAGNOSIS — T68XXXA Hypothermia, initial encounter: Secondary | ICD-10-CM | POA: Diagnosis not present

## 2020-05-22 DIAGNOSIS — L8990 Pressure ulcer of unspecified site, unspecified stage: Secondary | ICD-10-CM | POA: Insufficient documentation

## 2020-05-22 DIAGNOSIS — E871 Hypo-osmolality and hyponatremia: Secondary | ICD-10-CM | POA: Diagnosis not present

## 2020-05-22 LAB — PROCALCITONIN: Procalcitonin: <0.1 ng/mL

## 2020-05-22 LAB — COMPREHENSIVE METABOLIC PANEL
ALT: 13 U/L (ref 0–44)
AST: 21 U/L (ref 15–41)
Albumin: 2.9 g/dL — ABNORMAL LOW (ref 3.5–5.0)
Alkaline Phosphatase: 56 U/L (ref 38–126)
Anion gap: 7 (ref 5–15)
BUN: 11 mg/dL (ref 8–23)
CO2: 20 mmol/L — ABNORMAL LOW (ref 22–32)
Calcium: 7.8 mg/dL — ABNORMAL LOW (ref 8.9–10.3)
Chloride: 103 mmol/L (ref 98–111)
Creatinine, Ser: 1.07 mg/dL (ref 0.61–1.24)
GFR, Estimated: >60 mL/min (ref >60)
Glucose, Bld: 90 mg/dL (ref 70–99)
Potassium: 4.4 mmol/L (ref 3.5–5.1)
Sodium: 130 mmol/L — ABNORMAL LOW (ref 135–145)
Total Bilirubin: 0.9 mg/dL (ref 0.3–1.2)
Total Protein: 4.4 g/dL — ABNORMAL LOW (ref 6.5–8.1)

## 2020-05-22 LAB — LACTIC ACID, PLASMA: Lactic Acid, Venous: 0.9 mmol/L (ref 0.5–1.9)

## 2020-05-22 LAB — PROTIME-INR
INR: 1.2 (ref 0.8–1.2)
Prothrombin Time: 14.3 seconds (ref 11.4–15.2)

## 2020-05-22 LAB — URINE CULTURE

## 2020-05-22 LAB — CBC
HCT: 25.2 % — ABNORMAL LOW (ref 39.0–52.0)
Hemoglobin: 8.6 g/dL — ABNORMAL LOW (ref 13.0–17.0)
MCH: 35.4 pg — ABNORMAL HIGH (ref 26.0–34.0)
MCHC: 34.1 g/dL (ref 30.0–36.0)
MCV: 103.7 fL — ABNORMAL HIGH (ref 80.0–100.0)
Platelets: 143 10*3/uL — ABNORMAL LOW (ref 150–400)
RBC: 2.43 MIL/uL — ABNORMAL LOW (ref 4.22–5.81)
RDW: 15.2 % (ref 11.5–15.5)
WBC: 4.7 10*3/uL (ref 4.0–10.5)
nRBC: 0 % (ref 0.0–0.2)

## 2020-05-22 LAB — TROPONIN I (HIGH SENSITIVITY): Troponin I (High Sensitivity): 25 ng/L — ABNORMAL HIGH (ref <18)

## 2020-05-22 LAB — PHOSPHORUS: Phosphorus: 2.7 mg/dL (ref 2.5–4.6)

## 2020-05-22 LAB — MRSA PCR SCREENING: MRSA by PCR: POSITIVE — AB

## 2020-05-22 LAB — CORTISOL-AM, BLOOD: Cortisol - AM: 12.4 ug/dL (ref 6.7–22.6)

## 2020-05-22 MED ORDER — SODIUM CHLORIDE 0.9 % IV SOLN: INTRAVENOUS | Status: AC

## 2020-05-22 MED ORDER — SODIUM CHLORIDE 0.9 % IV BOLUS
500.0000 mL | Freq: Once | INTRAVENOUS | Status: AC
  Administered 2020-05-22: 500 mL via INTRAVENOUS

## 2020-05-22 MED ORDER — MUPIROCIN 2 % EX OINT
1.0000 "application " | TOPICAL_OINTMENT | Freq: Two times a day (BID) | CUTANEOUS | Status: DC
  Administered 2020-05-22 – 2020-05-26 (×9): 1 via NASAL
  Filled 2020-05-22 (×2): qty 22

## 2020-05-22 MED ORDER — CHLORHEXIDINE GLUCONATE CLOTH 2 % EX PADS
6.0000 | MEDICATED_PAD | Freq: Every day | CUTANEOUS | Status: DC
  Administered 2020-05-22 – 2020-05-26 (×7): 6 via TOPICAL

## 2020-05-22 MED ORDER — SODIUM CHLORIDE 0.9 % IV SOLN: INTRAVENOUS | Status: DC

## 2020-05-22 MED ORDER — METOPROLOL TARTRATE 5 MG/5ML IV SOLN
2.5000 mg | Freq: Four times a day (QID) | INTRAVENOUS | Status: DC | PRN
  Administered 2020-05-22: 2.5 mg via INTRAVENOUS

## 2020-05-22 MED ORDER — MIDODRINE HCL 5 MG PO TABS
5.0000 mg | ORAL_TABLET | Freq: Three times a day (TID) | ORAL | Status: DC
  Administered 2020-05-22 – 2020-05-23 (×5): 5 mg via ORAL
  Filled 2020-05-22 (×5): qty 1

## 2020-05-22 NOTE — Progress Notes (Addendum)
[facility] PROGRESS NOTE   Gerald Sandoval. ERX:[id number] DOB: 11-19-1923 DOA: 05/21/2020  PCP: Rodrigo Ran, MD  Brief History/Interval Summary: 86 y.o. male with medical history significant of Afib, BPH, Hearing loss.   Patient apparently lives in an independent living facility.  He was able to get out of his building on the day of admission admitted to the sidewalk and apparently was found on the ground with his wheelchair noted to be tipped over.  He appeared to be confused.  He was sent to the emergency department.  He was noted to have hypothermia.  He was hospitalized for further management.   Reason for Visit: Hypothermia  Consultants: None  Procedures: None  Antibiotics: Anti-infectives (From admission, onward)   Start     Dose/Rate Route Frequency Ordered Stop   05/22/20 1800  vancomycin (VANCOREADY) IVPB 750 mg/150 mL        750 mg 150 mL/hr over 60 Minutes Intravenous Every 24 hours 05/21/20 1838     05/22/20 0500  ceFEPIme (MAXIPIME) 2 g in sodium chloride 0.9 % 100 mL IVPB        2 g 200 mL/hr over 30 Minutes Intravenous Every 12 hours 05/21/20 1743     05/21/20 1745  vancomycin (VANCOREADY) IVPB 1250 mg/250 mL        1,250 mg 166.7 mL/hr over 90 Minutes Intravenous STAT 05/21/20 1741 05/21/20 1939   05/21/20 1630  ceFEPIme (MAXIPIME) 2 g in sodium chloride 0.9 % 100 mL IVPB  Status:  Discontinued        2 g 200 mL/hr over 30 Minutes Intravenous  Once 05/21/20 1626 05/21/20 1634   05/21/20 1630  vancomycin (VANCOCIN) IVPB 1000 mg/200 mL premix  Status:  Discontinued        1,000 mg 200 mL/hr over 60 Minutes Intravenous  Once 05/21/20 1626 05/21/20 1635   05/21/20 1615  ceFEPIme (MAXIPIME) 2 g in sodium chloride 0.9 % 100 mL IVPB        2 g 200 mL/hr over 30 Minutes Intravenous STAT 05/21/20 1608 05/21/20 1742      Subjective/Interval History: Patient is very hard of hearing.  He denies any pain.  Worried about injury to his lower extremities.  Denies  chest pain shortness of breath.      Assessment/Plan:  Hypotension Overnight patient blood pressure noted to be low.  It looks like he was started on Levophed infusion.  Blood pressure stabilized.  Patient was also noted to be hypokalemic at the time of admission which could be the reason for his low blood pressure.  He was given IV fluids with improvement.  Continue to monitor for now.  Taper down the Levophed.  It is contributing to his elevated heart rate.    Atrial fibrillation with RVR Likely secondary to the vasopressor agent.  Patient with known history of atrial fibrillation but not noted to be on anticoagulation.  Medication list show that he is on metoprolol at home which is currently on hold due to hypotension.  Continue to monitor on telemetry.  Hypothermia Most likely environmentally induced as he was outdoors yesterday morning and was found on the ground.  There was some concern for sepsis due to his hypothermia however his lactic acid level and procalcitonin were noted to be normal.  WBC was normal.  Temperatures have improved.  Follow-up on cultures.  We will stop his antibiotics for now as no infectious etiology is found.  There is a mention of possible  consolidative process in his right lung on the CT imaging.  However chest x-ray did not show any such process.  Patient does not seem to have any respiratory symptoms.  Fall Noted to be lying on the floor.  Baseline functional status is unclear although it appears that he gets around in a wheelchair.  No injuries noted on imaging studies.  He is able to move his extremities without any problem.  Hyponatremia Likely due to hypovolemia.  Improved with IV fluids.  Chronic hyponatremia based on his previous labs.  Noted to have a sodium of 133 in 2018.  Macrocytic anemia Mild drop in hemoglobin is likely dilutional.  No evidence of overt bleeding.  TSH 3.9.  Will check anemia panel including [id number] and folic acid levels.  Minimally  elevated troponin with abnormal EKG EKG similar to previous ones.  Patient denies any chest pain.  Reason for mild elevation in troponin is not clear.  No further work-up anticipated at this time.  Chronic kidney disease stage IIIa Stable.  Monitor urine output.  Skin tear left elbow Wound care.  Pressure injury stage II sacrum Pressure Injury 05/22/20 Sacrum Posterior;Mid Stage 2 -  Partial thickness loss of dermis presenting as a shallow open injury with a red, pink wound bed without slough. (Active)  05/22/20 0300  Location: Sacrum  Location Orientation: Posterior;Mid  Staging: Stage 2 -  Partial thickness loss of dermis presenting as a shallow open injury with a red, pink wound bed without slough.  Wound Description (Comments):   Present on Admission: Yes      DVT Prophylaxis: SCDs Code Status: DNR Family Communication: No family at bedside. Disposition Plan: Hopefully return to his facility when medically stable.  We will involve PT "OT as he may need higher level of care.  Status is: Inpatient  Remains inpatient appropriate because:IV treatments appropriate due to intensity of illness or inability to take PO and Inpatient level of care appropriate due to severity of illness   Dispo: The patient is from: Independent living facility?              Anticipated d/c is to: SNF              Anticipated d/c date is: 2 days              Patient currently is not medically stable to d/c.       Medications:  Scheduled: . Chlorhexidine Gluconate Cloth  6 each Topical Daily  . mupirocin ointment  1 application Nasal BID   Continuous: . sodium chloride Stopped (05/21/20 2158)  . sodium chloride 125 mL/hr at 05/22/20 0900  . sodium chloride    . ceFEPime (MAXIPIME) IV Stopped (05/22/20 0447)  . norepinephrine (LEVOPHED) Adult infusion 1 mcg/min (05/22/20 0900)  . vancomycin     [id number] **OR** acetaminophen, ondansetron **OR** ondansetron (ZOFRAN)  IV   Objective:  Vital Signs  Vitals:   05/22/20 0630 05/22/20 0645 05/22/20 0700 05/22/20 0800  BP: 117/74 132/74 (!) 105/49 (!) 98/57  Pulse: 87 100 70 91  Resp: (!) 28 13 17 10   Temp:    97.8 F (36.6 C)  TempSrc:    Axillary  SpO2: 94% 98% 97% 99%  Weight:      Height:        Intake/Output Summary (Last 24 hours) at 05/22/2020 0951 Last data filed at 05/22/2020 0900 Gross per 24 hour  Intake 3910.03 ml  Output 350 ml  Net 3560.03 ml  Filed Weights   05/21/20 1729 05/22/20 0315  Weight: 61.2 kg 66 kg    General appearance: Awake alert.  In no distress.  Very hard of hearing Resp: Clear to auscultation bilaterally.  Normal effort Cardio: S1-S2 is irregularly irregular.  Noted to be tachycardic.  No S3-S4.  No rubs murmurs or bruit GI: Abdomen is soft.  Nontender nondistended.  Bowel sounds are present normal.  No masses organomegaly Extremities: No edema.  Full range of motion of lower extremities. Neurologic: Oriented to place city.  Did not know the year or the month.  No focal neurological deficits.    Lab Results:  Data Reviewed: I have personally reviewed following labs and imaging studies  CBC: Recent Labs  Lab 05/21/20 1036 05/21/20 2016 05/22/20 0527  WBC 5.9  --  4.7  NEUTROABS 4.9  --   --   HGB 9.6* 8.3* 8.6*  HCT 27.6* 23.7* 25.2*  MCV 101.1*  --  103.7*  PLT 121*  --  143*    Basic Metabolic Panel: Recent Labs  Lab 05/21/20 1036 05/22/20 0527  NA 127* 130*  K 4.5 4.4  CL 96* 103  CO2 21* 20*  GLUCOSE 114* 90  BUN 15 11  CREATININE 1.22 1.07  CALCIUM 8.4* 7.8*  PHOS  --  2.7    GFR: Estimated Creatinine Clearance: 38.1 mL/min (by C-G formula based on SCr of 1.07 mg/dL).  Liver Function Tests: Recent Labs  Lab 05/22/20 0527  AST 21  ALT 13  ALKPHOS 56  BILITOT 0.9  PROT 4.4*  ALBUMIN 2.9*    Coagulation Profile: Recent Labs  Lab 05/22/20 0527  INR 1.2     Thyroid Function Tests: Recent Labs     05/21/20 2136  TSH 3.936    Anemia Panel: No results for input(s): VITAMINB12, FOLATE, FERRITIN, TIBC, IRON, RETICCTPCT in the last 72 hours.  Recent Results (from the past 240 hour(s))  Resp Panel by RT-PCR (Flu A&B, Covid) Nasopharyngeal Swab     Status: None   Collection Time: 05/21/20  3:08 PM   Specimen: Nasopharyngeal Swab; Nasopharyngeal(NP) swabs in vial transport medium  Result Value Ref Range Status   SARS Coronavirus 2 by RT PCR NEGATIVE NEGATIVE Final    Comment: (NOTE) SARS-CoV-2 target nucleic acids are NOT DETECTED.  The SARS-CoV-2 RNA is generally detectable in upper respiratory specimens during the acute phase of infection. The lowest concentration of SARS-CoV-2 viral copies this assay can detect is 138 copies/mL. A negative result does not preclude SARS-Cov-2 infection and should not be used as the sole basis for treatment or other patient management decisions. A negative result may occur with  improper specimen collection/handling, submission of specimen other than nasopharyngeal swab, presence of viral mutation(s) within the areas targeted by this assay, and inadequate number of viral copies(<138 copies/mL). A negative result must be combined with clinical observations, patient history, and epidemiological information. The expected result is Negative.  Fact Sheet for Patients:  EntrepreneurPulse.com.au  Fact Sheet for Healthcare Providers:  IncredibleEmployment.be  This test is no t yet approved or cleared by the [place] FDA and  has been authorized for detection and/or diagnosis of SARS-CoV-2 by FDA under an Emergency Use Authorization (EUA). This EUA will remain  in effect (meaning this test can be used) for the duration of the COVID-19 declaration under Section 564(b)(1) of the Act, 21 U.S.C.section 360bbb-3(b)(1), unless the authorization is terminated  or revoked sooner.       Influenza A by  PCR NEGATIVE  NEGATIVE Final   Influenza B by PCR NEGATIVE NEGATIVE Final    Comment: (NOTE) The Xpert Xpress SARS-CoV-2/FLU/RSV plus assay is intended as an aid in the diagnosis of influenza from Nasopharyngeal swab specimens and should not be used as a sole basis for treatment. Nasal washings and aspirates are unacceptable for Xpert Xpress SARS-CoV-2/FLU/RSV testing.  Fact Sheet for Patients: EntrepreneurPulse.com.au  Fact Sheet for Healthcare Providers: IncredibleEmployment.be  This test is not yet approved or cleared by the [place] FDA and has been authorized for detection and/or diagnosis of SARS-CoV-2 by FDA under an Emergency Use Authorization (EUA). This EUA will remain in effect (meaning this test can be used) for the duration of the COVID-19 declaration under Section 564(b)(1) of the Act, 21 U.S.C. section 360bbb-3(b)(1), unless the authorization is terminated or revoked.  Performed at [facility], [place] [address], [place], [place] [address]   Culture, blood (routine x 2)     Status: None (Preliminary result)   Collection Time: 05/21/20  3:08 PM   Specimen: BLOOD  Result Value Ref Range Status   Specimen Description   Final    BLOOD BLOOD RIGHT FOREARM Performed at [facility] [address], [place], [place] [address]    Special Requests   Final    Blood Culture adequate volume Performed at [facility] [address], [place], [place] [address]    Culture   Final    NO GROWTH < 12 HOURS Performed at [facility] [address], [place], [place] [address]    Report Status PENDING  Incomplete  Culture, blood (routine x 2)     Status: None (Preliminary result)   Collection Time: 05/21/20  3:08 PM   Specimen: BLOOD  Result Value Ref Range Status   Specimen Description   Final    BLOOD BLOOD LEFT FOREARM Performed at [facility] [address] [place], [place] [address]    Special Requests   Final    Blood Culture adequate volume Performed at [facility] [address] [place], [place] [address]    Culture   Final    NO GROWTH < 12 HOURS Performed at [facility] [address], [place], [place] [address]    Report Status PENDING  Incomplete  MRSA PCR Screening     Status: Abnormal   Collection Time: 05/22/20  3:19 AM   Specimen: Nasopharyngeal  Result Value Ref Range Status   MRSA by PCR POSITIVE (A) NEGATIVE Final    Comment:        The GeneXpert MRSA Assay (FDA approved for NASAL specimens only), is one component of a comprehensive MRSA colonization surveillance program. It is not intended to diagnose MRSA infection nor to guide or monitor treatment for MRSA infections. CRITICAL RESULT CALLED TO, READ BACK BY AND VERIFIED WITH: SHAY [facility] RN 05/22/20@ 0458 BY P.HENDERSON Performed at [facility] [address] [place], [place] [address]       Radiology Studies: DG Chest 2 View  Result Date: 05/21/2020 CLINICAL DATA:  Found outside. EXAM: CHEST - 2 VIEW COMPARISON:  05/22/2016 FINDINGS: Lucency under the right diaphragm which appears tubular and bounded by folds, attributed to bowel. Chronic cardiomegaly and interstitial coarsening. There is no edema, consolidation, effusion, or pneumothorax. Remote midthoracic compression fracture with advanced height loss. IMPRESSION: No acute finding when compared to prior. Electronically Signed   By: Angelica Chessman  Watts M.D.   On: 05/21/2020 08:53   CT Head Wo Contrast  Result Date: 05/21/2020 CLINICAL DATA:  Fall from wheelchair. EXAM: CT HEAD WITHOUT CONTRAST CT CERVICAL SPINE WITHOUT CONTRAST TECHNIQUE: Multidetector CT imaging of the head and cervical spine was performed following the standard protocol without intravenous contrast. Multiplanar CT image reconstructions of the cervical spine were also generated. COMPARISON:   Head CT 06/30/2016.  Cervical spine CT 05/05/2016. FINDINGS: CT HEAD FINDINGS Brain: There is no evidence for acute hemorrhage, hydrocephalus, mass lesion, or abnormal extra-axial fluid collection. No definite CT evidence for acute infarction. Diffuse loss of parenchymal volume is consistent with atrophy. Patchy low attenuation in the deep hemispheric and periventricular white matter is nonspecific, but likely reflects chronic microvascular ischemic demyelination. Vascular: No hyperdense vessel or unexpected calcification. Skull: No evidence for fracture. No worrisome lytic or sclerotic lesion. Sinuses/Orbits: The visualized paranasal sinuses and mastoid air cells are clear. Visualized portions of the globes and intraorbital fat are unremarkable. Other: None. CT CERVICAL SPINE FINDINGS Alignment: Accentuated thoracic lordosis with associated accentuation of upper thoracic kyphosis. No subluxation. Skull base and vertebrae: No evidence for an acute fracture in the bony anatomy of the cervical spine. Compression deformity at T1 and T3 is stable since 2017 exam. Soft tissues and spinal canal: Unremarkable. Disc levels: Loss of disc height noted at C5-6 and to a lesser degree at C4-5 and C6-7. Upper chest: Dependent collapse/consolidative opacity in the right hemithorax with potential pleural effusion. Other: None. IMPRESSION: 1. No acute intracranial abnormality. Atrophy with chronic small vessel white matter ischemic disease. 2. Degenerative changes in the cervical spine without evidence for an acute fracture. 3. Dependent collapse/consolidative opacity in the posterior right hemithorax with potential pleural effusion. Electronically Signed   By: Misty Stanley M.D.   On: 05/21/2020 08:21   CT Cervical Spine Wo Contrast  Result Date: 05/21/2020 CLINICAL DATA:  Fall from wheelchair. EXAM: CT HEAD WITHOUT CONTRAST CT CERVICAL SPINE WITHOUT CONTRAST TECHNIQUE: Multidetector CT imaging of the head and cervical spine  was performed following the standard protocol without intravenous contrast. Multiplanar CT image reconstructions of the cervical spine were also generated. COMPARISON:  Head CT 06/30/2016.  Cervical spine CT 05/05/2016. FINDINGS: CT HEAD FINDINGS Brain: There is no evidence for acute hemorrhage, hydrocephalus, mass lesion, or abnormal extra-axial fluid collection. No definite CT evidence for acute infarction. Diffuse loss of parenchymal volume is consistent with atrophy. Patchy low attenuation in the deep hemispheric and periventricular white matter is nonspecific, but likely reflects chronic microvascular ischemic demyelination. Vascular: No hyperdense vessel or unexpected calcification. Skull: No evidence for fracture. No worrisome lytic or sclerotic lesion. Sinuses/Orbits: The visualized paranasal sinuses and mastoid air cells are clear. Visualized portions of the globes and intraorbital fat are unremarkable. Other: None. CT CERVICAL SPINE FINDINGS Alignment: Accentuated thoracic lordosis with associated accentuation of upper thoracic kyphosis. No subluxation. Skull base and vertebrae: No evidence for an acute fracture in the bony anatomy of the cervical spine. Compression deformity at T1 and T3 is stable since 2017 exam. Soft tissues and spinal canal: Unremarkable. Disc levels: Loss of disc height noted at C5-6 and to a lesser degree at C4-5 and C6-7. Upper chest: Dependent collapse/consolidative opacity in the right hemithorax with potential pleural effusion. Other: None. IMPRESSION: 1. No acute intracranial abnormality. Atrophy with chronic small vessel white matter ischemic disease. 2. Degenerative changes in the cervical spine without evidence for an acute fracture. 3. Dependent collapse/consolidative opacity in the posterior right hemithorax with potential  pleural effusion. Electronically Signed   By: Misty Stanley M.D.   On: 05/21/2020 08:21   DG Knee Complete 4 Views Left  Result Date:  05/21/2020 CLINICAL DATA:  Fall. EXAM: LEFT KNEE - COMPLETE 4+ VIEW COMPARISON:  None. FINDINGS: No evidence of fracture, dislocation, or joint effusion. No evidence of arthropathy or other focal bone abnormality. Soft tissues are unremarkable. IMPRESSION: Negative. Electronically Signed   By: Misty Stanley M.D.   On: 05/21/2020 08:52       LOS: 1 day   [facility] Pager on [email or web address]  05/22/2020, 9:51 AM

## 2020-05-22 NOTE — ED Notes (Signed)
ED TO INPATIENT HANDOFF REPORT  Name/Age/Gender Gerald Sandoval. 86 y.o. male  Code Status    Code Status Orders  (From admission, onward)         Start     Ordered   05/21/20 1627  Do not attempt resuscitation (DNR)  Continuous       Question Answer Comment  In the event of cardiac or respiratory ARREST Do not call a "code blue"   In the event of cardiac or respiratory ARREST Do not perform Intubation, CPR, defibrillation or ACLS   In the event of cardiac or respiratory ARREST Use medication by any route, position, wound care, and other measures to relive pain and suffering. May use oxygen, suction and manual treatment of airway obstruction as needed for comfort.   Comments Confirmed with neice      05/21/20 1626        Code Status History    Date Active Date Inactive Code Status Order ID Comments User Context   05/06/2016 0038 05/09/2016 2151 DNR [id number]  Karmen Bongo, MD Inpatient   06/25/2015 1548 07/03/2015 2123 DNR [id number]  Willia Craze, NP Inpatient   06/25/2015 1004 06/25/2015 1548 DNR [id number]  Leo Grosser, MD ED   Advance Care Planning Activity      Home/SNF/Other Skilled nursing facility  Chief Complaint Syncope [R55]  Level of Care/Admitting Diagnosis ED Disposition    ED Disposition Condition [place]: [facility] [[id number]]  Level of Care: Stepdown [14]  Admit to SDU based on following criteria: Severe physiological/psychological symptoms:  Any diagnosis requiring assessment & intervention at least every 4 hours on an ongoing basis to obtain desired patient outcomes including stability and rehabilitation  May admit patient to [facility] or [facility] if equivalent level of care is available:: No  Covid Evaluation: Asymptomatic Screening Protocol (No Symptoms)  Diagnosis: Syncope [206001]  Admitting Physician: Jonnie Finner [id number]  Attending Physician: Jonnie Finner [id number]  Estimated length of  stay: past midnight tomorrow  Certification:: I certify this patient will need inpatient services for at least 2 midnights       Medical History Past Medical History:  Diagnosis Date  . Abscess of left hip    infection left hip  . Anemia   . BPH (benign prostatic hyperplasia)   . Colon polyp   . Diastolic dysfunction   . Dysphagia    unspecified  . Emphysema   . Gastric ulcer   . Gastritis   . Hernia    Rt. Inguinial  . History of colonic polyps   . Hypogonadism in male   . Kidney stones   . Leg wound, left   . Osteoarthritis   . Osteoporosis   . Permanent atrial fibrillation   . RBBB (right bundle branch block)   . Rotator cuff injury     Allergies Allergies  Allergen Reactions  . Hydrocodone-Acetaminophen Other (See Comments)    DELIRIUM, AGITATION, HYPERACTIVITY  . Seroquel [Quetiapine] Other (See Comments)    DELIRIUM, AGITATION, HYPERACTIVITY  . Primidone Other (See Comments)    IV Location/Drains/Wounds Patient Lines/Drains/Airways Status    Active Line/Drains/Airways    Name Placement date Placement time Site Days   Peripheral IV 05/21/20 Anterior;Right Forearm 05/21/20  1020  Forearm  1   Peripheral IV 05/21/20 Anterior;Left Forearm 05/21/20  1740  Forearm  1   Negative Pressure Wound Therapy Leg Left 06/26/16  1406  --  1426  Labs/Imaging Results for orders placed or performed during the hospital encounter of 05/21/20 (from the past 48 hour(s))  CBC with Differential     Status: Abnormal   Collection Time: 05/21/20 10:36 AM  Result Value Ref Range   WBC 5.9 4.0 - 10.5 K/uL   RBC 2.73 (L) 4.22 - 5.81 MIL/uL   Hemoglobin 9.6 (L) 13.0 - 17.0 g/dL   HCT 27.6 (L) 39.0 - 52.0 %   MCV 101.1 (H) 80.0 - 100.0 fL   MCH 35.2 (H) 26.0 - 34.0 pg   MCHC 34.8 30.0 - 36.0 g/dL   RDW 14.7 11.5 - 15.5 %   Platelets 121 (L) 150 - 400 K/uL   nRBC 0.0 0.0 - 0.2 %   Neutrophils Relative % 82 %   Neutro Abs 4.9 1.7 - 7.7 K/uL   Lymphocytes Relative 8  %   Lymphs Abs 0.5 (L) 0.7 - 4.0 K/uL   Monocytes Relative 6 %   Monocytes Absolute 0.3 0.1 - 1.0 K/uL   Eosinophils Relative 3 %   Eosinophils Absolute 0.2 0.0 - 0.5 K/uL   Basophils Relative 0 %   Basophils Absolute 0.0 0.0 - 0.1 K/uL   Immature Granulocytes 1 %   Abs Immature Granulocytes 0.04 0.00 - 0.07 K/uL    Comment: Performed at [facility], [place] [address], [place], [place] [id number]  Basic metabolic panel     Status: Abnormal   Collection Time: 05/21/20 10:36 AM  Result Value Ref Range   Sodium 127 (L) 135 - 145 mmol/L   Potassium 4.5 3.5 - 5.1 mmol/L   Chloride 96 (L) 98 - 111 mmol/L   CO2 21 (L) 22 - 32 mmol/L   Glucose, Bld 114 (H) 70 - 99 mg/dL    Comment: Glucose reference range applies only to samples taken after fasting for at least 8 hours.   BUN 15 8 - 23 mg/dL   Creatinine, Ser 1.22 0.61 - 1.24 mg/dL   Calcium 8.4 (L) 8.9 - 10.3 mg/dL   GFR, Estimated 55 (L) >60 mL/min    Comment: (NOTE) Calculated using the CKD-EPI Creatinine Equation (2021)    Anion gap 10 5 - 15    Comment: Performed at [facility], [place] [address], [place], [place] [address]  Resp Panel by RT-PCR (Flu A&B, Covid) Nasopharyngeal Swab     Status: None   Collection Time: 05/21/20  3:08 PM   Specimen: Nasopharyngeal Swab; Nasopharyngeal(NP) swabs in vial transport medium  Result Value Ref Range   SARS Coronavirus 2 by RT PCR NEGATIVE NEGATIVE    Comment: (NOTE) SARS-CoV-2 target nucleic acids are NOT DETECTED.  The SARS-CoV-2 RNA is generally detectable in upper respiratory specimens during the acute phase of infection. The lowest concentration of SARS-CoV-2 viral copies this assay can detect is 138 copies/mL. A negative result does not preclude SARS-Cov-2 infection and should not be used as the sole basis for treatment or other patient management decisions. A negative result may occur with  improper specimen collection/handling, submission of  specimen other than nasopharyngeal swab, presence of viral mutation(s) within the areas targeted by this assay, and inadequate number of viral copies(<138 copies/mL). A negative result must be combined with clinical observations, patient history, and epidemiological information. The expected result is Negative.  Fact Sheet for Patients:  EntrepreneurPulse.com.au  Fact Sheet for Healthcare Providers:  IncredibleEmployment.be  This test is no t yet approved or cleared by the [place] FDA and  has  been authorized for detection and/or diagnosis of SARS-CoV-2 by FDA under an Emergency Use Authorization (EUA). This EUA will remain  in effect (meaning this test can be used) for the duration of the COVID-19 declaration under Section 564(b)(1) of the Act, 21 U.S.C.section 360bbb-3(b)(1), unless the authorization is terminated  or revoked sooner.       Influenza A by PCR NEGATIVE NEGATIVE   Influenza B by PCR NEGATIVE NEGATIVE    Comment: (NOTE) The Xpert Xpress SARS-CoV-2/FLU/RSV plus assay is intended as an aid in the diagnosis of influenza from Nasopharyngeal swab specimens and should not be used as a sole basis for treatment. Nasal washings and aspirates are unacceptable for Xpert Xpress SARS-CoV-2/FLU/RSV testing.  Fact Sheet for Patients: BloggerCourse.com  Fact Sheet for Healthcare Providers: SeriousBroker.it  This test is not yet approved or cleared by the [place] FDA and has been authorized for detection and/or diagnosis of SARS-CoV-2 by FDA under an Emergency Use Authorization (EUA). This EUA will remain in effect (meaning this test can be used) for the duration of the COVID-19 declaration under Section 564(b)(1) of the Act, 21 U.S.C. section 360bbb-3(b)(1), unless the authorization is terminated or revoked.  Performed at [facility], [address] [place], [place] [address]   Urinalysis, Routine w reflex microscopic Urine, Clean Catch     Status: Abnormal   Collection Time: 05/21/20  3:08 PM  Result Value Ref Range   Color, Urine YELLOW YELLOW   APPearance CLOUDY (A) CLEAR   Specific Gravity, Urine >1.030 (H) 1.005 - 1.030   pH 5.5 5.0 - 8.0   Glucose, UA NEGATIVE NEGATIVE mg/dL   Hgb urine dipstick NEGATIVE NEGATIVE   Bilirubin Urine NEGATIVE NEGATIVE   Ketones, ur NEGATIVE NEGATIVE mg/dL   Protein, ur NEGATIVE NEGATIVE mg/dL   Nitrite NEGATIVE NEGATIVE   Leukocytes,Ua NEGATIVE NEGATIVE    Comment: Microscopic not done on urines with negative protein, blood, leukocytes, nitrite, or glucose < 500 mg/dL. Performed at [facility], [address], [place], [place] [address]   Lactic acid, plasma     Status: None   Collection Time: 05/21/20  3:08 PM  Result Value Ref Range   Lactic Acid, Venous 1.3 0.5 - 1.9 mmol/L    Comment: Performed at [facility], [address], [place], [place] [address]  Procalcitonin - Baseline     Status: None   Collection Time: 05/21/20  3:08 PM  Result Value Ref Range   Procalcitonin <0.10 ng/mL    Comment:        Interpretation: PCT (Procalcitonin) <= 0.5 ng/mL: Systemic infection (sepsis) is not likely. Local bacterial infection is possible. (NOTE)       Sepsis PCT Algorithm           Lower Respiratory Tract                                      Infection PCT Algorithm    ----------------------------     ----------------------------         PCT < 0.25 ng/mL                PCT < 0.10 ng/mL          Strongly encourage             Strongly discourage   discontinuation of antibiotics    initiation of antibiotics    ----------------------------     -----------------------------  PCT 0.25 - 0.50 ng/mL            PCT 0.10 - 0.25 ng/mL               OR       >80% decrease in PCT            Discourage initiation of                                             antibiotics      Encourage discontinuation           of antibiotics    ----------------------------     -----------------------------         PCT >= 0.50 ng/mL              PCT 0.26 - 0.50 ng/mL               AND        <80% decrease in PCT             Encourage initiation of                                             antibiotics       Encourage continuation           of antibiotics    ----------------------------     -----------------------------        PCT >= 0.50 ng/mL                  PCT > 0.50 ng/mL               AND         increase in PCT                  Strongly encourage                                      initiation of antibiotics    Strongly encourage escalation           of antibiotics                                     -----------------------------                                           PCT <= 0.25 ng/mL                                                 OR                                        > 80% decrease in PCT  Discontinue / Do not initiate                                             antibiotics  Performed at [place] [address] [place], [place] [address]   Hemoglobin and hematocrit, blood     Status: Abnormal   Collection Time: 05/21/20  8:16 PM  Result Value Ref Range   Hemoglobin 8.3 (L) 13.0 - 17.0 g/dL   HCT 23.7 (L) 39.0 - 52.0 %    Comment: Performed at [facility], [place] [address], [place], [place] [address]  POC occult blood, ED RN will collect     Status: None   Collection Time: 05/21/20  8:56 PM  Result Value Ref Range   Fecal Occult Bld NEGATIVE NEGATIVE  TSH     Status: None   Collection Time: 05/21/20  9:36 PM  Result Value Ref Range   TSH 3.936 0.350 - 4.500 uIU/mL    Comment: Performed by a 3rd Generation assay with a functional sensitivity of <=0.01 uIU/mL. Performed at [facility], [place] [address] [place], [place] [address]    Lactic acid, plasma     Status: None   Collection Time: 05/21/20  9:36 PM  Result Value Ref Range   Lactic Acid, Venous 1.0 0.5 - 1.9 mmol/L    Comment: Performed at [facility], [place] [address] [place], [place] [address]  Troponin I (High Sensitivity)     Status: Abnormal   Collection Time: 05/21/20  9:36 PM  Result Value Ref Range   Troponin I (High Sensitivity) 23 (H) <18 ng/L    Comment: (NOTE) Elevated high sensitivity troponin I (hsTnI) values and significant  changes across serial measurements may suggest ACS but many other  chronic and acute conditions are known to elevate hsTnI results.  Refer to the "Links" section for chest pain algorithms and additional  guidance. Performed at [facility], [place] [address], [place], [place] [address]   Troponin I (High Sensitivity)     Status: Abnormal   Collection Time: 05/21/20 11:36 PM  Result Value Ref Range   Troponin I (High Sensitivity) 25 (H) <18 ng/L    Comment: (NOTE) Elevated high sensitivity troponin I (hsTnI) values and significant  changes across serial measurements may suggest ACS but many other  chronic and acute conditions are known to elevate hsTnI results.  Refer to the "Links" section for chest pain algorithms and additional  guidance. Performed at [facility], [place] [address] [place], [place] [address]   Lactic acid, plasma     Status: None   Collection Time: 05/22/20 12:36 AM  Result Value Ref Range   Lactic Acid, Venous 0.9 0.5 - 1.9 mmol/L    Comment: Performed at [facility], [place] [address] [place], [place] [address]   DG Chest 2 View  Result Date: 05/21/2020 CLINICAL DATA:  Found outside. EXAM: CHEST - 2 VIEW COMPARISON:  05/22/2016 FINDINGS: Lucency under the right diaphragm which appears tubular and bounded by folds, attributed to bowel. Chronic cardiomegaly and interstitial coarsening. There is no edema, consolidation,  effusion, or pneumothorax. Remote midthoracic compression fracture with advanced height loss. IMPRESSION: No acute finding when compared to prior. Electronically Signed   By: Monte Fantasia M.D.   On: 05/21/2020 08:53   CT Head  Wo Contrast  Result Date: 05/21/2020 CLINICAL DATA:  Fall from wheelchair. EXAM: CT HEAD WITHOUT CONTRAST CT CERVICAL SPINE WITHOUT CONTRAST TECHNIQUE: Multidetector CT imaging of the head and cervical spine was performed following the standard protocol without intravenous contrast. Multiplanar CT image reconstructions of the cervical spine were also generated. COMPARISON:  Head CT 06/30/2016.  Cervical spine CT 05/05/2016. FINDINGS: CT HEAD FINDINGS Brain: There is no evidence for acute hemorrhage, hydrocephalus, mass lesion, or abnormal extra-axial fluid collection. No definite CT evidence for acute infarction. Diffuse loss of parenchymal volume is consistent with atrophy. Patchy low attenuation in the deep hemispheric and periventricular white matter is nonspecific, but likely reflects chronic microvascular ischemic demyelination. Vascular: No hyperdense vessel or unexpected calcification. Skull: No evidence for fracture. No worrisome lytic or sclerotic lesion. Sinuses/Orbits: The visualized paranasal sinuses and mastoid air cells are clear. Visualized portions of the globes and intraorbital fat are unremarkable. Other: None. CT CERVICAL SPINE FINDINGS Alignment: Accentuated thoracic lordosis with associated accentuation of upper thoracic kyphosis. No subluxation. Skull base and vertebrae: No evidence for an acute fracture in the bony anatomy of the cervical spine. Compression deformity at T1 and T3 is stable since 2017 exam. Soft tissues and spinal canal: Unremarkable. Disc levels: Loss of disc height noted at C5-6 and to a lesser degree at C4-5 and C6-7. Upper chest: Dependent collapse/consolidative opacity in the right hemithorax with potential pleural effusion. Other: None.  IMPRESSION: 1. No acute intracranial abnormality. Atrophy with chronic small vessel white matter ischemic disease. 2. Degenerative changes in the cervical spine without evidence for an acute fracture. 3. Dependent collapse/consolidative opacity in the posterior right hemithorax with potential pleural effusion. Electronically Signed   By: Misty Stanley M.D.   On: 05/21/2020 08:21   CT Cervical Spine Wo Contrast  Result Date: 05/21/2020 CLINICAL DATA:  Fall from wheelchair. EXAM: CT HEAD WITHOUT CONTRAST CT CERVICAL SPINE WITHOUT CONTRAST TECHNIQUE: Multidetector CT imaging of the head and cervical spine was performed following the standard protocol without intravenous contrast. Multiplanar CT image reconstructions of the cervical spine were also generated. COMPARISON:  Head CT 06/30/2016.  Cervical spine CT 05/05/2016. FINDINGS: CT HEAD FINDINGS Brain: There is no evidence for acute hemorrhage, hydrocephalus, mass lesion, or abnormal extra-axial fluid collection. No definite CT evidence for acute infarction. Diffuse loss of parenchymal volume is consistent with atrophy. Patchy low attenuation in the deep hemispheric and periventricular white matter is nonspecific, but likely reflects chronic microvascular ischemic demyelination. Vascular: No hyperdense vessel or unexpected calcification. Skull: No evidence for fracture. No worrisome lytic or sclerotic lesion. Sinuses/Orbits: The visualized paranasal sinuses and mastoid air cells are clear. Visualized portions of the globes and intraorbital fat are unremarkable. Other: None. CT CERVICAL SPINE FINDINGS Alignment: Accentuated thoracic lordosis with associated accentuation of upper thoracic kyphosis. No subluxation. Skull base and vertebrae: No evidence for an acute fracture in the bony anatomy of the cervical spine. Compression deformity at T1 and T3 is stable since 2017 exam. Soft tissues and spinal canal: Unremarkable. Disc levels: Loss of disc height noted at  C5-6 and to a lesser degree at C4-5 and C6-7. Upper chest: Dependent collapse/consolidative opacity in the right hemithorax with potential pleural effusion. Other: None. IMPRESSION: 1. No acute intracranial abnormality. Atrophy with chronic small vessel white matter ischemic disease. 2. Degenerative changes in the cervical spine without evidence for an acute fracture. 3. Dependent collapse/consolidative opacity in the posterior right hemithorax with potential pleural effusion. Electronically Signed   By: Verda Cumins.D.  On: 05/21/2020 08:21   DG Knee Complete 4 Views Left  Result Date: 05/21/2020 CLINICAL DATA:  Fall. EXAM: LEFT KNEE - COMPLETE 4+ VIEW COMPARISON:  None. FINDINGS: No evidence of fracture, dislocation, or joint effusion. No evidence of arthropathy or other focal bone abnormality. Soft tissues are unremarkable. IMPRESSION: Negative. Electronically Signed   By: Misty Stanley M.D.   On: 05/21/2020 08:52    Pending Labs Unresulted Labs (From admission, onward)          Start     Ordered   05/22/20 0500  Procalcitonin  Daily,   R      05/21/20 1453   05/22/20 0500  Protime-INR  Tomorrow morning,   R        05/21/20 1626   05/22/20 0500  Cortisol-am, blood  Tomorrow morning,   R        05/21/20 1626   05/22/20 0500  Comprehensive metabolic panel  Tomorrow morning,   R        05/21/20 1626   05/22/20 0500  CBC  Tomorrow morning,   R        05/21/20 1626   05/22/20 0500  Phosphorus  Tomorrow morning,   R        05/21/20 2324   05/21/20 1633  Sodium, urine, random  Once,   R        05/21/20 1633   05/21/20 1633  Osmolality, urine  Once,   R        05/21/20 1633   05/21/20 1425  Lactic acid, plasma  STAT Now then every 3 hours,   R (with STAT occurrences)      05/21/20 1424   05/21/20 1424  Culture, blood (routine x 2)  BLOOD CULTURE X 2,   R (with STAT occurrences)      05/21/20 1424   05/21/20 1423  Culture, Urine  Once,   STAT        05/21/20 1424           Vitals/Pain Today's Vitals   05/22/20 0030 05/22/20 0100 05/22/20 0130 05/22/20 0219  BP: 107/61 106/88 95/77   Pulse:  61    Resp: 15 18 17    Temp:    (!) 97.5 F (36.4 C)  TempSrc:    Oral  SpO2: 96% 97% 96%   Weight:      Height:        Isolation Precautions No active isolations  Medications Medications  acetaminophen (TYLENOL) tablet 650 mg (has no administration in time range)    Or  acetaminophen (TYLENOL) suppository 650 mg (has no administration in time range)  ondansetron (ZOFRAN) tablet 4 mg (has no administration in time range)    Or  ondansetron (ZOFRAN) injection 4 mg (has no administration in time range)  ceFEPIme (MAXIPIME) 2 g in sodium chloride 0.9 % 100 mL IVPB (has no administration in time range)  vancomycin (VANCOREADY) IVPB 750 mg/150 mL (has no administration in time range)  0.9 %  sodium chloride infusion ( Intravenous New Bag/Given 05/21/20 2155)  0.9 %  sodium chloride infusion (0 mLs Intravenous Hold 05/21/20 2158)  norepinephrine (LEVOPHED) 4mg  in 290mL premix infusion (3 mcg/min Intravenous Rate/Dose Change 05/21/20 2315)  sodium chloride 0.9 % bolus 500 mL (0 mLs Intravenous Stopped 05/21/20 1216)  sodium chloride 0.9 % bolus 1,000 mL (0 mLs Intravenous Stopped 05/21/20 1623)  ceFEPIme (MAXIPIME) 2 g in sodium chloride 0.9 % 100 mL IVPB (0 g Intravenous Stopped 05/21/20 1742)  vancomycin (VANCOREADY) IVPB 1250 mg/250 mL (0 mg Intravenous Stopped 05/21/20 1939)  sodium chloride 0.9 % bolus 500 mL (0 mLs Intravenous Stopped 05/21/20 1846)    Mobility non-ambulatory

## 2020-05-22 NOTE — Evaluation (Signed)
Clinical/Bedside Swallow Evaluation Patient Details  Name: Gerald Sandoval. MRN: [id number] Date of Birth: August 03, 1923  Today's Date: 05/22/2020 Time: SLP Start Time (ACUTE ONLY): 1555 SLP Stop Time (ACUTE ONLY): 1618 SLP Time Calculation (min) (ACUTE ONLY): 23 min  Past Medical History:  Past Medical History:  Diagnosis Date  . Abscess of left hip    infection left hip  . Anemia   . BPH (benign prostatic hyperplasia)   . Colon polyp   . Diastolic dysfunction   . Dysphagia    unspecified  . Emphysema   . Gastric ulcer   . Gastritis   . Hernia    Rt. Inguinial  . History of colonic polyps   . Hypogonadism in male   . Kidney stones   . Leg wound, left   . Osteoarthritis   . Osteoporosis   . Permanent atrial fibrillation   . RBBB (right bundle branch block)   . Rotator cuff injury    Past Surgical History:  Past Surgical History:  Procedure Laterality Date  . I & D EXTREMITY Left 06/26/2016   Procedure: LEFT LEG IRRIGATION AND DEBRIDEMENT, SPLIT THICKNESS SKIN GRAFT, WOUND VAC;  Surgeon: Leandrew Koyanagi, MD;  Location: [facility];  Service: Orthopedics;  Laterality: Left;  . INGUINAL HERNIA REPAIR     RT  . IRRIGATION AND DEBRIDEMENT HEMATOMA     Left leg  . ORIF PERIPROSTHETIC FRACTURE Right 06/29/2015   Procedure: OPEN REDUCTION INTERNAL FIXATION (ORIF) RIGHT HIP PERIPROSTHETIC FRACTURE;  Surgeon: Leandrew Koyanagi, MD;  Location: [facility];  Service: Orthopedics;  Laterality: Right;  . Resection of Ribs    . SKIN SPLIT GRAFT Left 06/26/2016   Procedure: SKIN GRAFT SPLIT THICKNESS;  Surgeon: Leandrew Koyanagi, MD;  Location: [facility];  Service: Orthopedics;  Laterality: Left;  . TOTAL HIP ARTHROPLASTY     Both hips; Left in 2004, Right in 2007  . TRANSURETHRAL RESECTION OF PROSTATE     HPI:  86 y.o. male with medical history significant of Afib, BPH, Hearing loss. History is mostly from chart review/ED staff when pt arrived at hospital on 05/21/20.  He lives in an independent living facility.   We are unclear about what happened next, but he was eventually found down with his wheelchair tipped over on 05/21/20. He appeared confused and was sent to the ED; nursing staff stated he "clears his throat a lot, even without food/liquids." CXR on 05/21/20 indicated No acute finding when compared to prior CXR.  Assessment / Plan / Recommendation Clinical Impression  Pt with oropharyngeal dysphagia characterized by slow mastication due to edentulous state, delay in the initiation of the swallow (presbyphagia likely) and delayed throat clearing intermittently during BSE.  Pt stated "I clear my throat a lot" and nursing confirmed that pt clears his throat often, even without meals.  No overt s/s of aspiration evident within snack consumption, but delayed throat clearing could potentially point to esophageal component to swallowing.  Pt extremely HOH and with mask wearing, this exacerbates this condition, so case history may not be fully accurate.  Continue current diet of Dysphagia 3 (mechanical soft)/thin liquids with slow, small bites and sips, and effortful swallow recommended to decrease potential for pharyngeal residue during meals.  ST will f/u for diet tolerance to r/o aspiration due to fatigue factor paired with generalized weakness and pt report of frequent throat clearing.  Thank you for this consult. SLP Visit Diagnosis: Dysphagia, oropharyngeal phase (R13.12)    Aspiration Risk  Mild aspiration risk    Diet Recommendation   Dysphagia 3/thin liquids  Medication Administration: Whole meds with liquid    Other  Recommendations Oral Care Recommendations: Oral care BID   Follow up Recommendations Other (comment) (TBD)      Frequency and Duration min 1 x/week  1 week       Prognosis Prognosis for Safe Diet Advancement: Good      Swallow Study   General Date of Onset: 05/21/20 HPI: 86 y.o. male with medical history significant of Afib, BPH, Hearing loss. History is mostly from chart  review/ED staff. After initial interview, patient was able to tell me a little more but not entirely clear. He lives in an independent living facility. He somehow was able to get out of one building yesterday and make it to a sidewalk (He confirms this). We are unclear about what happened next, but he was eventually found down with his wheelchair tipped over this morning. He appeared confused and was sent to the ED Type of Study: Bedside Swallow Evaluation Diet Prior to this Study: Dysphagia 3 (soft);Thin liquids Temperature Spikes Noted: No Respiratory Status: Room air History of Recent Intubation: No Behavior/Cognition: Alert;Cooperative;Requires cueing;Other (Comment) (HOH) Oral Cavity Assessment: Within Functional Limits Oral Care Completed by SLP: Recent completion by staff Oral Cavity - Dentition: Edentulous Self-Feeding Abilities: Needs assist Patient Positioning: Upright in bed Baseline Vocal Quality: Low vocal intensity Volitional Cough: Other (Comment) (unable to initiate d/t level of hearing impairment/masking) Volitional Swallow: Able to elicit    Oral/Motor/Sensory Function Overall Oral Motor/Sensory Function: Generalized oral weakness   Ice Chips Ice chips:  (Pt expelled from oral cavity stating "I don't like ice")   Thin Liquid Thin Liquid: Impaired Presentation: Cup Pharyngeal  Phase Impairments: Suspected delayed Swallow;Throat Clearing - Delayed    Nectar Thick Nectar Thick Liquid: Not tested   Honey Thick Honey Thick Liquid: Not tested   Puree Puree: Impaired Presentation: Spoon Pharyngeal Phase Impairments: Suspected delayed Swallow;Throat Clearing - Delayed   Solid     Solid: Impaired Presentation: Self Fed Oral Phase Impairments: Reduced lingual movement/coordination;Impaired mastication Oral Phase Functional Implications: Prolonged oral transit;Impaired mastication Pharyngeal Phase Impairments: Throat Clearing - Delayed      Tressie Stalker, M.S.,  CCC-SLP 05/22/2020,5:57 PM

## 2020-05-23 ENCOUNTER — Encounter (HOSPITAL_COMMUNITY): Payer: Self-pay | Admitting: Internal Medicine

## 2020-05-23 DIAGNOSIS — I959 Hypotension, unspecified: Secondary | ICD-10-CM | POA: Diagnosis not present

## 2020-05-23 DIAGNOSIS — E871 Hypo-osmolality and hyponatremia: Secondary | ICD-10-CM | POA: Diagnosis not present

## 2020-05-23 DIAGNOSIS — I4821 Permanent atrial fibrillation: Secondary | ICD-10-CM

## 2020-05-23 LAB — RETICULOCYTES
Immature Retic Fract: 11.9 % (ref 2.3–15.9)
RBC.: 2.48 MIL/uL — ABNORMAL LOW (ref 4.22–5.81)
Retic Count, Absolute: 61 10*3/uL (ref 19.0–186.0)
Retic Ct Pct: 2.5 % (ref 0.4–3.1)

## 2020-05-23 LAB — COMPREHENSIVE METABOLIC PANEL
ALT: 13 U/L (ref 0–44)
AST: 18 U/L (ref 15–41)
Albumin: 2.8 g/dL — ABNORMAL LOW (ref 3.5–5.0)
Alkaline Phosphatase: 67 U/L (ref 38–126)
Anion gap: 3 — ABNORMAL LOW (ref 5–15)
BUN: 14 mg/dL (ref 8–23)
CO2: 24 mmol/L (ref 22–32)
Calcium: 8.2 mg/dL — ABNORMAL LOW (ref 8.9–10.3)
Chloride: 104 mmol/L (ref 98–111)
Creatinine, Ser: 0.89 mg/dL (ref 0.61–1.24)
GFR, Estimated: >60 mL/min (ref >60)
Glucose, Bld: 114 mg/dL — ABNORMAL HIGH (ref 70–99)
Potassium: 4.2 mmol/L (ref 3.5–5.1)
Sodium: 131 mmol/L — ABNORMAL LOW (ref 135–145)
Total Bilirubin: 0.5 mg/dL (ref 0.3–1.2)
Total Protein: 4.5 g/dL — ABNORMAL LOW (ref 6.5–8.1)

## 2020-05-23 LAB — FOLATE: Folate: 3.5 ng/mL — ABNORMAL LOW (ref >5.9)

## 2020-05-23 LAB — IRON AND TIBC
Iron: 61 ug/dL (ref 45–182)
Saturation Ratios: 36 % (ref 17.9–39.5)
TIBC: 170 ug/dL — ABNORMAL LOW (ref 250–450)
UIBC: 109 ug/dL

## 2020-05-23 LAB — CBC
HCT: 25.8 % — ABNORMAL LOW (ref 39.0–52.0)
Hemoglobin: 8.8 g/dL — ABNORMAL LOW (ref 13.0–17.0)
MCH: 35.3 pg — ABNORMAL HIGH (ref 26.0–34.0)
MCHC: 34.1 g/dL (ref 30.0–36.0)
MCV: 103.6 fL — ABNORMAL HIGH (ref 80.0–100.0)
Platelets: 134 10*3/uL — ABNORMAL LOW (ref 150–400)
RBC: 2.49 MIL/uL — ABNORMAL LOW (ref 4.22–5.81)
RDW: 15.3 % (ref 11.5–15.5)
WBC: 8.8 10*3/uL (ref 4.0–10.5)
nRBC: 0 % (ref 0.0–0.2)

## 2020-05-23 LAB — FERRITIN: Ferritin: 716 ng/mL — ABNORMAL HIGH (ref 24–336)

## 2020-05-23 LAB — VITAMIN B12: Vitamin B-12: 1797 pg/mL — ABNORMAL HIGH (ref 180–914)

## 2020-05-23 LAB — PROCALCITONIN: Procalcitonin: <0.1 ng/mL

## 2020-05-23 MED ORDER — FOLIC ACID 1 MG PO TABS
1.0000 mg | ORAL_TABLET | Freq: Every day | ORAL | Status: DC
  Administered 2020-05-23 – 2020-05-26 (×4): 1 mg via ORAL
  Filled 2020-05-23 (×5): qty 1

## 2020-05-23 NOTE — TOC Initial Note (Signed)
Transition of Care (TOC) - Initial/Assessment Note    Patient Details  Name: Gerald Sandoval. MRN: [id number] Date of Birth: 01-06-1924  Transition of Care [facility]) CM/SW Contact:    Leeroy Cha, RN Phone Number: 05/23/2020, 8:51 AM  Clinical Narrative:                 86 y.o.malewith medical history significant ofAfib, BPH, Hearing loss.  Patient apparently lives in an independent living facility.  He was able to get out of his building on the day of admission admitted to the sidewalk and apparently was found on the ground with his wheelchair noted to be tipped over.  He appeared to be confused.  He was sent to the emergency department.  He was noted to have hypothermia.  He was hospitalized for further management.   Reason for Visit: Hypothermia  PLAN: TO RETURN TO HOME FOLLOWING FOR PROGRESSION, IV LEVOPHED BP 87/35-109/623 ALERT HGB 8.8.   Subjective/Interval History: Patient is very hard of hearing.  He denies any pain.  Worried about injury to his lower extremities.  Denies chest pain shortness of breath.    Expected Discharge Plan: Home/Self Care Barriers to Discharge: Continued Medical Work up   Patient Goals and CMS Choice Patient states their goals for this hospitalization and ongoing recovery are:: to go home CMS Medicare.gov Compare Post Acute Care list provided to:: Patient Choice offered to / list presented to : Patient  Expected Discharge Plan and Services Expected Discharge Plan: Home/Self Care   Discharge Planning Services: CM Consult   Living arrangements for the past 2 months: Apartment                                      Prior Living Arrangements/Services Living arrangements for the past 2 months: Apartment Lives with:: Self Patient language and need for interpreter reviewed:: Yes Do you feel safe going back to the place where you live?: Yes      Need for Family Participation in Patient Care: Yes (Comment) Care giver support  system in place?: Yes (comment)   Criminal Activity/Legal Involvement Pertinent to Current Situation/Hospitalization: No - Comment as needed  Activities of Daily Living      Permission Sought/Granted                  Emotional Assessment Appearance:: Appears stated age Attitude/Demeanor/Rapport: Engaged Affect (typically observed): Calm Orientation: : Oriented to Place,Oriented to Self,Oriented to  Time,Oriented to Situation Alcohol / Substance Use: Not Applicable Psych Involvement: No (comment)  Admission diagnosis:  Hyponatremia [E87.1] Syncope [R55] Left shoulder pain [M25.512] Hypothermia, initial encounter [T68.XXXA] Fall, initial encounter [id number]] Patient Active Problem List   Diagnosis Date Noted  . Pressure injury of skin 05/22/2020  . Syncope 05/21/2020  . Wound of left leg   . Protein-calorie malnutrition, severe 05/08/2016  . Acute encephalopathy 05/07/2016  . Hematoma of leg, left, initial encounter 05/06/2016  . Hyperglycemia 05/06/2016  . Chronic anemia 05/06/2016  . AKI (acute kidney injury) ([place]) 05/05/2016  . Persistent atrial fibrillation ([place])   . Chronic anticoagulation-Pradaxa prior to admission 06/28/2015  . RBBB 06/28/2015  . DVT of Rt lower limb, acute ([place]) 06/28/2015  . S/P IVC filter 06/27/15 06/28/2015  . S/P Lt hip replacement, chronically infected [facility]) 06/28/2015  . Lactic acidosis   . Encounter for palliative care   . Goals of care, counseling/discussion   .  Fracture - Rt hip prosthesis this adm   . Severe sepsis- found down at nursing home 06/25/2015  . Sepsis ([facility]) 06/25/2015  . Edema of right lower extremity 06/25/2015  . History of right hip replacement 06/25/2015  . Hyperkalemia 06/25/2015  . Metabolic acidosis 06/25/2015  . EMPHYSEMA 08/03/2009  . Unilateral primary osteoarthritis, right knee 08/03/2009  . ARTHRITIS 08/03/2009  . GASTRIC ULCER 06/02/2009  . GASTRITIS 06/02/2009  . COLONIC POLYPS 05/03/2009  .  DYSPHAGIA UNSPECIFIED 05/03/2009   PCP:  Rodrigo Ran, MD Pharmacy:   [address] [place], [place] - [address] [place] [address] Phone: [phone] Fax: [phone]  [facility] - [place], [place] - [address] [place] [address] Phone: [phone] Fax: [phone]     Social Determinants of Health (SDOH) Interventions    Readmission Risk Interventions No flowsheet data found.

## 2020-05-23 NOTE — Evaluation (Signed)
Physical Therapy Evaluation Patient Details Name: Gerald Sandoval. MRN: [id number] DOB: 08/06/1923 Today's Date: 05/23/2020   History of Present Illness  86 y.o. male with medical history significant of Afib, BPH, Hearing loss.   Patient apparently lives in an independent living facility.  He was able to get out of his building on the day of admission admitted to the sidewalk and apparently was found on the ground with his wheelchair noted to be tipped over.  He appeared to be confused.  He was sent to the emergency department.  He was noted to have hypothermia  Clinical Impression  Pt admitted with above diagnosis.  Pt currently with functional limitations due to the deficits listed below (see PT Problem List). Pt will benefit from skilled PT to increase their independence and safety with mobility to allow discharge to the venue listed below.  Pt agreeable to mobilize however upon attempting to scoot into recliner, BM observed on bed pad so assisted pt with return to supine(nurse tech called to assist pt with pericare/hygiene).  Pt's HR also elevated to 160 bpm during activity however back down to 101 bpm prior to therapist leaving room (RN notified).  Pt reports feeling weak and "stiff."  Pt does not recall events leading to admission. Pt would benefit from d/c to SNF.     Follow Up Recommendations SNF    Equipment Recommendations  None recommended by PT    Recommendations for Other Services       Precautions / Restrictions Precautions Precautions: Fall Precaution Comments: monitor vitals      Mobility  Bed Mobility Overal bed mobility: Needs Assistance Bed Mobility: Supine to Sit;Sit to Supine     Supine to sit: Mod assist Sit to supine: Max assist   General bed mobility comments: multimodal cues for technique, assist for trunk and scooting to EOB, assist for upper and lower body to return to bed    Transfers                    Ambulation/Gait                 Stairs            Wheelchair Mobility    Modified Rankin (Stroke Patients Only)       Balance Overall balance assessment: Needs assistance Sitting-balance support: Bilateral upper extremity supported;Feet supported Sitting balance-Leahy Scale: Poor Sitting balance - Comments: reliant on UE support, pt attempted to lateral scoot however unable without total assist                                     Pertinent Vitals/Pain Pain Assessment: Faces Faces Pain Scale: No hurt    Home Living       Type of Home: Independent living facility         Home Equipment: Dan Humphreys - 4 wheels;Wheelchair - manual      Prior Function Level of Independence: Independent with assistive device(s);Needs assistance   Gait / Transfers Assistance Needed: pt reports using w/c and "thinks" he can ambulate, admitted from facility ILF?, appears to have fallen out of w/c leading to admission           Hand Dominance        Extremity/Trunk Assessment        Lower Extremity Assessment Lower Extremity Assessment: Generalized weakness    Cervical / Trunk Assessment Cervical /  Trunk Assessment: Kyphotic;Other exceptions Cervical / Trunk Exceptions: foward head posture  Communication   Communication: HOH  Cognition Arousal/Alertness: Awake/alert Behavior During Therapy: Flat affect Overall Cognitive Status: No family/caregiver present to determine baseline cognitive functioning Area of Impairment: Orientation                 Orientation Level: Disoriented to;Place;Time;Situation             General Comments: pt does not recall events leading to admission      General Comments      Exercises     Assessment/Plan    PT Assessment Patient needs continued PT services  PT Problem List Decreased balance;Decreased strength;Decreased mobility;Decreased knowledge of use of DME;Decreased activity tolerance;Cardiopulmonary status limiting activity        PT Treatment Interventions DME instruction;Gait training;Balance training;Wheelchair mobility training;Therapeutic exercise;Functional mobility training;Therapeutic activities;Patient/family education;Neuromuscular re-education    PT Goals (Current goals can be found in the Care Plan section)  Acute Rehab PT Goals PT Goal Formulation: With patient Time For Goal Achievement: 06/06/20 Potential to Achieve Goals: Good    Frequency Min 2X/week   Barriers to discharge        Co-evaluation               AM-PAC PT "6 Clicks" Mobility  Outcome Measure Help needed turning from your back to your side while in a flat bed without using bedrails?: A Lot Help needed moving from lying on your back to sitting on the side of a flat bed without using bedrails?: A Lot Help needed moving to and from a bed to a chair (including a wheelchair)?: Total Help needed standing up from a chair using your arms (e.g., wheelchair or bedside chair)?: Total Help needed to walk in hospital room?: Total Help needed climbing 3-5 steps with a railing? : Total 6 Click Score: 8    End of Session Equipment Utilized During Treatment: Gait belt Activity Tolerance: Other (comment) (limited by tachycardia) Patient left: in bed;with call bell/phone within reach;with bed alarm set Nurse Communication: Mobility status PT Visit Diagnosis: Muscle weakness (generalized) (M62.81);Other abnormalities of gait and mobility (R26.89)    Time: [id number] PT Time Calculation (min) (ACUTE ONLY): 22 min   Charges:   PT Evaluation $PT Eval Moderate Complexity: 1 Mod         Kati PT, DPT Acute Rehabilitation Services Pager: [phone] Office: [phone]  Trena Platt 05/23/2020, 3:24 PM

## 2020-05-23 NOTE — Progress Notes (Signed)
[facility] PROGRESS NOTE   Gerald Sandoval. [id number] DOB: 07/06/1923 DOA: 05/21/2020  PCP: Crist Infante, MD  Brief History/Interval Summary: 86 y.o. male with medical history significant of Afib, BPH, Hearing loss.   Patient apparently lives in an independent living facility.  He was able to get out of his building on the day of admission admitted to the sidewalk and apparently was found on the ground with his wheelchair noted to be tipped over.  He appeared to be confused.  He was sent to the emergency department.  He was noted to have hypothermia.  He was hospitalized for further management.   Reason for Visit: Hypothermia  Consultants: None  Procedures: None  Antibiotics: Anti-infectives (From admission, onward)   Start     Dose/Rate Route Frequency Ordered Stop   05/22/20 1800  vancomycin (VANCOREADY) IVPB 750 mg/150 mL  Status:  Discontinued        750 mg 150 mL/hr over 60 Minutes Intravenous Every 24 hours 05/21/20 1838 05/22/20 1002   05/22/20 0500  ceFEPIme (MAXIPIME) 2 g in sodium chloride 0.9 % 100 mL IVPB  Status:  Discontinued        2 g 200 mL/hr over 30 Minutes Intravenous Every 12 hours 05/21/20 1743 05/22/20 1002   05/21/20 1745  vancomycin (VANCOREADY) IVPB 1250 mg/250 mL        1,250 mg 166.7 mL/hr over 90 Minutes Intravenous STAT 05/21/20 1741 05/21/20 1939   05/21/20 1630  ceFEPIme (MAXIPIME) 2 g in sodium chloride 0.9 % 100 mL IVPB  Status:  Discontinued        2 g 200 mL/hr over 30 Minutes Intravenous  Once 05/21/20 1626 05/21/20 1634   05/21/20 1630  vancomycin (VANCOCIN) IVPB 1000 mg/200 mL premix  Status:  Discontinued        1,000 mg 200 mL/hr over 60 Minutes Intravenous  Once 05/21/20 1626 05/21/20 1635   05/21/20 1615  ceFEPIme (MAXIPIME) 2 g in sodium chloride 0.9 % 100 mL IVPB        2 g 200 mL/hr over 30 Minutes Intravenous STAT 05/21/20 1608 05/21/20 1742      Subjective/Interval History: Patient is extremely hard of hearing. He  was noted to be sleeping soundly. He was woken up. He denies any discomfort. No chest pain or shortness of breath. Discussed with nursing staff. Has been off of Levophed since yesterday afternoon.       Assessment/Plan:  Hypotension Patient was noted to be hypotensive. He was taken off of his antihypertensives. He was given fluid bolus. He required Levophed for about 18 hours. Slowly weaned off. Reason for low blood pressure is not entirely clear but could be due to hypovolemia. Improved with fluid bolus. Cortisol level was normal. He was also started on midodrine. Per family he has had low blood pressures recently for the past few weeks. Seems to be better today. Has not been on Levophed since yesterday afternoon. Continue midodrine. Continue to hold his scheduled metoprolol. PT and OT evaluation. Okay for transfer to telemetry floor   Atrial fibrillation with RVR heart rate was noted to be elevated yesterday most likely due to the vasopressor. Continue metoprolol as needed for now. Once blood pressure stabilizes we will put him back on his scheduled oral metoprolol. Heart rate seems to be better this morning. Continue to monitor on telemetry.   Hypothermia Most likely environmentally induced as he was outdoors yesterday morning and was found on the ground.  There was  some concern for sepsis due to his hypothermia however his lactic acid level and procalcitonin were noted to be normal.  WBC was normal.   Temperatures have improved. Follow-up on cultures but his antibacterials were discontinued yesterday. No infectious etiology found. There was a mention of possible consolidative process in his right lung on CT imaging however chest x-ray did not show any such process. Patient does not have any respiratory symptoms. Speech therapy evaluation.   Fall Noted to be lying on the floor.  Baseline functional status is unclear although it appears that he gets around in a wheelchair.  No injuries noted on  imaging studies.  He is able to move his extremities without any problem. PT and OT evaluation.  Hyponatremia Likely due to hypovolemia.  Improved with IV fluids. Noted to have chronic hyponatremia based on his previous labs. He had a sodium of 133 in 2018.  Macrocytic anemia/folic acid deficiency Mild drop in hemoglobin is likely dilutional.  No evidence of overt bleeding.  TSH 3.9. Anemia panel reviewed. Folate deficiency identified which will be repleted. B12 is not deficient. Ferritin is 716.   Minimally elevated troponin with abnormal EKG EKG similar to previous ones. Patient denies chest discomfort. Reason for mild elevation in troponin is not clear.  No further work-up anticipated at this time.  Chronic kidney disease stage IIIa Stable.  Monitor urine output.  Skin tear left elbow Wound care.  Pressure injury stage II sacrum Pressure Injury 05/22/20 Sacrum Posterior;Mid Stage 2 -  Partial thickness loss of dermis presenting as a shallow open injury with a red, pink wound bed without slough. (Active)  05/22/20 0300  Location: Sacrum  Location Orientation: Posterior;Mid  Staging: Stage 2 -  Partial thickness loss of dermis presenting as a shallow open injury with a red, pink wound bed without slough.  Wound Description (Comments):   Present on Admission: Yes      DVT Prophylaxis: SCDs Code Status: DNR Family Communication: No family at bedside. Will discuss with his niece. Disposition Plan: Hopefully return to his facility when medically stable.  We will involve PT/OT as he may need higher level of care.  Status is: Inpatient  Remains inpatient appropriate because:IV treatments appropriate due to intensity of illness or inability to take PO and Inpatient level of care appropriate due to severity of illness   Dispo: The patient is from: Independent living facility?              Anticipated d/c is to: SNF              Anticipated d/c date is: 2 days              Patient  currently is not medically stable to d/c.       Medications:  Scheduled: . Chlorhexidine Gluconate Cloth  6 each Topical Daily  . folic acid  1 mg Oral Daily  . midodrine  5 mg Oral TID WC  . mupirocin ointment  1 application Nasal BID   Continuous: . sodium chloride Stopped (05/21/20 2158)   YTK:[id number] **OR** acetaminophen, metoprolol tartrate, ondansetron **OR** ondansetron (ZOFRAN) IV   Objective:  Vital Signs  Vitals:   05/23/20 0359 05/23/20 0400 05/23/20 0600 05/23/20 0800  BP:  109/66 109/62   Pulse:  93 90   Resp:  (!) 22 14   Temp:  98.3 F (36.8 C)  (!) 97.5 F (36.4 C)  TempSrc:  Axillary  Oral  SpO2:  94% 94%   Weight:  66 kg     Height:        Intake/Output Summary (Last 24 hours) at 05/23/2020 0917 Last data filed at 05/23/2020 0600 Gross per 24 hour  Intake 1582.97 ml  Output 1050 ml  Net 532.97 ml   Filed Weights   05/21/20 1729 05/22/20 0315 05/23/20 0359  Weight: 61.2 kg 66 kg 66 kg    General appearance: Somnolent but easily arousable. In no distress. Very hard of hearing. Resp: Clear to auscultation bilaterally.  Normal effort Cardio: S1-S2 is irregularly irregular. No S3-S4. Systolic murmur appreciated over the precordium.  GI: Abdomen is soft.  Nontender nondistended.  Bowel sounds are present normal.  No masses organomegaly Extremities: No edema. Able to move his extremities. Neurologic: No focal neurological deficits.     Lab Results:  Data Reviewed: I have personally reviewed following labs and imaging studies  CBC: Recent Labs  Lab 05/21/20 1036 05/21/20 2016 05/22/20 0527 05/23/20 0203  WBC 5.9  --  4.7 8.8  NEUTROABS 4.9  --   --   --   HGB 9.6* 8.3* 8.6* 8.8*  HCT 27.6* 23.7* 25.2* 25.8*  MCV 101.1*  --  103.7* 103.6*  PLT 121*  --  143* 134*    Basic Metabolic Panel: Recent Labs  Lab 05/21/20 1036 05/22/20 0527 05/23/20 0203  NA 127* 130* 131*  K 4.5 4.4 4.2  CL 96* 103 104  CO2 21* 20* 24   GLUCOSE 114* 90 114*  BUN 15 11 14   CREATININE 1.22 1.07 0.89  CALCIUM 8.4* 7.8* 8.2*  PHOS  --  2.7  --     GFR: Estimated Creatinine Clearance: 45.8 mL/min (by C-G formula based on SCr of 0.89 mg/dL).  Liver Function Tests: Recent Labs  Lab 05/22/20 0527 05/23/20 0203  AST 21 18  ALT 13 13  ALKPHOS 56 67  BILITOT 0.9 0.5  PROT 4.4* 4.5*  ALBUMIN 2.9* 2.8*    Coagulation Profile: Recent Labs  Lab 05/22/20 0527  INR 1.2     Thyroid Function Tests: Recent Labs    05/21/20 2136  TSH 3.936    Anemia Panel: Recent Labs    05/23/20 0203  VITAMINB12 1,797*  FOLATE 3.5*  FERRITIN 716*  TIBC 170*  IRON 61  RETICCTPCT 2.5    Recent Results (from the past 240 hour(s))  Resp Panel by RT-PCR (Flu A&B, Covid) Nasopharyngeal Swab     Status: None   Collection Time: 05/21/20  3:08 PM   Specimen: Nasopharyngeal Swab; Nasopharyngeal(NP) swabs in vial transport medium  Result Value Ref Range Status   SARS Coronavirus 2 by RT PCR NEGATIVE NEGATIVE Final    Comment: (NOTE) SARS-CoV-2 target nucleic acids are NOT DETECTED.  The SARS-CoV-2 RNA is generally detectable in upper respiratory specimens during the acute phase of infection. The lowest concentration of SARS-CoV-2 viral copies this assay can detect is 138 copies/mL. A negative result does not preclude SARS-Cov-2 infection and should not be used as the sole basis for treatment or other patient management decisions. A negative result may occur with  improper specimen collection/handling, submission of specimen other than nasopharyngeal swab, presence of viral mutation(s) within the areas targeted by this assay, and inadequate number of viral copies(<138 copies/mL). A negative result must be combined with clinical observations, patient history, and epidemiological information. The expected result is Negative.  Fact Sheet for Patients:  EntrepreneurPulse.com.au  Fact Sheet for Healthcare  Providers:  IncredibleEmployment.be  This test is no t yet approved  or cleared by the [place] and  has been authorized for detection and/or diagnosis of SARS-CoV-2 by FDA under an Emergency Use Authorization (EUA). This EUA will remain  in effect (meaning this test can be used) for the duration of the COVID-19 declaration under Section 564(b)(1) of the Act, 21 U.S.C.section 360bbb-3(b)(1), unless the authorization is terminated  or revoked sooner.       Influenza A by PCR NEGATIVE NEGATIVE Final   Influenza B by PCR NEGATIVE NEGATIVE Final    Comment: (NOTE) The Xpert Xpress SARS-CoV-2/FLU/RSV plus assay is intended as an aid in the diagnosis of influenza from Nasopharyngeal swab specimens and should not be used as a sole basis for treatment. Nasal washings and aspirates are unacceptable for Xpert Xpress SARS-CoV-2/FLU/RSV testing.  Fact Sheet for Patients: EntrepreneurPulse.com.au  Fact Sheet for Healthcare Providers: IncredibleEmployment.be  This test is not yet approved or cleared by the [place] FDA and has been authorized for detection and/or diagnosis of SARS-CoV-2 by FDA under an Emergency Use Authorization (EUA). This EUA will remain in effect (meaning this test can be used) for the duration of the COVID-19 declaration under Section 564(b)(1) of the Act, 21 U.S.C. section 360bbb-3(b)(1), unless the authorization is terminated or revoked.  Performed at [facility], [place] [address] [place], [place] [address]   Culture, Urine     Status: Abnormal   Collection Time: 05/21/20  3:08 PM   Specimen: Urine, Random  Result Value Ref Range Status   Specimen Description   Final    URINE, RANDOM Performed at [facility] [address] [place], [place] [address]    Special Requests   Final    NONE Performed at [facility], [place] [address],  [place], [place] [address]    Culture MULTIPLE SPECIES PRESENT, SUGGEST RECOLLECTION (A)  Final   Report Status 05/22/2020 FINAL  Final  Culture, blood (routine x 2)     Status: None (Preliminary result)   Collection Time: 05/21/20  3:08 PM   Specimen: BLOOD  Result Value Ref Range Status   Specimen Description   Final    BLOOD BLOOD RIGHT FOREARM Performed at [facility] [address], [place], [place] [address]    Special Requests   Final    Blood Culture adequate volume Performed at [facility] [address], [place], [place] [address]    Culture   Final    NO GROWTH < 12 HOURS Performed at [facility] [address] [place], [place] [address]    Report Status PENDING  Incomplete  Culture, blood (routine x 2)     Status: None (Preliminary result)   Collection Time: 05/21/20  3:08 PM   Specimen: BLOOD  Result Value Ref Range Status   Specimen Description   Final    BLOOD BLOOD LEFT FOREARM Performed at [facility] [address] [place], [place] [address]    Special Requests   Final    Blood Culture adequate volume Performed at [facility] [address], [place], [place] [address]    Culture   Final    NO GROWTH < 12 HOURS Performed at [facility] [address] [place],  [address]    Report Status PENDING  Incomplete  MRSA PCR Screening     Status: Abnormal   Collection Time: 05/22/20  3:19 AM   Specimen: Nasopharyngeal  Result Value Ref  Range Status   MRSA by PCR POSITIVE (A) NEGATIVE Final    Comment:        The GeneXpert MRSA Assay (FDA approved for NASAL specimens only), is one component of a comprehensive MRSA colonization surveillance program. It is not intended to diagnose MRSA infection nor to guide or monitor treatment for MRSA infections. CRITICAL RESULT CALLED TO, READ BACK BY AND VERIFIED WITH: SHAY [facility] RN 05/22/20@ 0458 BY  P.HENDERSON Performed at [facility] [address] [place], [place] [address]       Radiology Studies: No results found.     LOS: 2 days   [facility] Pager on [email or web address]  05/23/2020, 9:17 AM

## 2020-05-23 NOTE — Plan of Care (Signed)

## 2020-05-24 DIAGNOSIS — E871 Hypo-osmolality and hyponatremia: Secondary | ICD-10-CM | POA: Diagnosis not present

## 2020-05-24 DIAGNOSIS — I959 Hypotension, unspecified: Secondary | ICD-10-CM | POA: Diagnosis not present

## 2020-05-24 DIAGNOSIS — I4821 Permanent atrial fibrillation: Secondary | ICD-10-CM | POA: Diagnosis not present

## 2020-05-24 LAB — CBC
HCT: 26.1 % — ABNORMAL LOW (ref 39.0–52.0)
Hemoglobin: 8.8 g/dL — ABNORMAL LOW (ref 13.0–17.0)
MCH: 35.5 pg — ABNORMAL HIGH (ref 26.0–34.0)
MCHC: 33.7 g/dL (ref 30.0–36.0)
MCV: 105.2 fL — ABNORMAL HIGH (ref 80.0–100.0)
Platelets: 111 10*3/uL — ABNORMAL LOW (ref 150–400)
RBC: 2.48 MIL/uL — ABNORMAL LOW (ref 4.22–5.81)
RDW: 15.5 % (ref 11.5–15.5)
WBC: 6.8 10*3/uL (ref 4.0–10.5)
nRBC: 0 % (ref 0.0–0.2)

## 2020-05-24 LAB — COMPREHENSIVE METABOLIC PANEL
ALT: 13 U/L (ref 0–44)
AST: 20 U/L (ref 15–41)
Albumin: 2.5 g/dL — ABNORMAL LOW (ref 3.5–5.0)
Alkaline Phosphatase: 54 U/L (ref 38–126)
Anion gap: 6 (ref 5–15)
BUN: 12 mg/dL (ref 8–23)
CO2: 20 mmol/L — ABNORMAL LOW (ref 22–32)
Calcium: 8.1 mg/dL — ABNORMAL LOW (ref 8.9–10.3)
Chloride: 104 mmol/L (ref 98–111)
Creatinine, Ser: 0.73 mg/dL (ref 0.61–1.24)
GFR, Estimated: >60 mL/min (ref >60)
Glucose, Bld: 106 mg/dL — ABNORMAL HIGH (ref 70–99)
Potassium: 4 mmol/L (ref 3.5–5.1)
Sodium: 130 mmol/L — ABNORMAL LOW (ref 135–145)
Total Bilirubin: 0.7 mg/dL (ref 0.3–1.2)
Total Protein: 4.4 g/dL — ABNORMAL LOW (ref 6.5–8.1)

## 2020-05-24 LAB — VITAMIN D 25 HYDROXY (VIT D DEFICIENCY, FRACTURES): Vit D, 25-Hydroxy: 25.73 ng/mL — ABNORMAL LOW (ref 30–100)

## 2020-05-24 MED ORDER — METOPROLOL TARTRATE 25 MG PO TABS
12.5000 mg | ORAL_TABLET | Freq: Two times a day (BID) | ORAL | Status: DC
  Administered 2020-05-24 – 2020-05-26 (×5): 12.5 mg via ORAL
  Filled 2020-05-24 (×5): qty 1

## 2020-05-24 MED ORDER — MIDODRINE HCL 5 MG PO TABS
10.0000 mg | ORAL_TABLET | Freq: Three times a day (TID) | ORAL | Status: DC
  Administered 2020-05-24 – 2020-05-26 (×9): 10 mg via ORAL
  Filled 2020-05-24 (×10): qty 2

## 2020-05-24 NOTE — Progress Notes (Signed)
[facility] PROGRESS NOTE   Gerald Sandoval. VY:[id number] DOB: [birth year] DOA: 05/21/2020  PCP: Crist Infante, MD  Brief History/Interval Summary: 86 y.o. male with medical history significant of Afib, BPH, Hearing loss.   Patient apparently lives in an independent living facility.  He was able to get out of his building on the day of admission admitted to the sidewalk and apparently was found on the ground with his wheelchair noted to be tipped over.  He appeared to be confused.  He was sent to the emergency department.  He was noted to have hypothermia.  He was hospitalized for further management.   Reason for Visit: Hypothermia  Consultants: None  Procedures: None  Antibiotics: Anti-infectives (From admission, onward)   Start     Dose/Rate Route Frequency Ordered Stop   05/22/20 1800  vancomycin (VANCOREADY) IVPB 750 mg/150 mL  Status:  Discontinued        750 mg 150 mL/hr over 60 Minutes Intravenous Every 24 hours 05/21/20 1838 05/22/20 1002   05/22/20 0500  ceFEPIme (MAXIPIME) 2 g in sodium chloride 0.9 % 100 mL IVPB  Status:  Discontinued        2 g 200 mL/hr over 30 Minutes Intravenous Every 12 hours 05/21/20 1743 05/22/20 1002   05/21/20 1745  vancomycin (VANCOREADY) IVPB 1250 mg/250 mL        1,250 mg 166.7 mL/hr over 90 Minutes Intravenous STAT 05/21/20 1741 05/21/20 1939   05/21/20 1630  ceFEPIme (MAXIPIME) 2 g in sodium chloride 0.9 % 100 mL IVPB  Status:  Discontinued        2 g 200 mL/hr over 30 Minutes Intravenous  Once 05/21/20 1626 05/21/20 1634   05/21/20 1630  vancomycin (VANCOCIN) IVPB 1000 mg/200 mL premix  Status:  Discontinued        1,000 mg 200 mL/hr over 60 Minutes Intravenous  Once 05/21/20 1626 05/21/20 1635   05/21/20 1615  ceFEPIme (MAXIPIME) 2 g in sodium chloride 0.9 % 100 mL IVPB        2 g 200 mL/hr over 30 Minutes Intravenous STAT 05/21/20 1608 05/21/20 1742      Subjective/Interval History: Patient is extremely hard of hearing.   He was sleeping this morning.  Denies any complaints when he was woken up.  No overnight issues noted on the chart.  Will discuss with nursing staff.       Assessment/Plan:  Hypotension Patient was noted to be hypotensive at the time of admission. He was taken off of his antihypertensives. He was given fluid bolus. He required Levophed for about 18 hours. Slowly weaned off. Reason for low blood pressure is not entirely clear but could be due to hypovolemia. Improved with fluid bolus. Cortisol level was normal. He was also started on midodrine. Per family he has had low blood pressures recently for the past few weeks.  Patient's blood pressure have been stable.  Dose of midodrine was increased.  PT and OT evaluation.    Atrial fibrillation with RVR Initially noted to have RVR while he was on vasopressors.  Heart rate has been stable for the last 24 hours.  Occasionally goes into the early 100s.  Patient was on metoprolol prior to admission.  This was discontinued due to hypotension as discussed above.  Since blood pressures have now stabilized we will place him on low-dose metoprolol and continue to monitor him.  Patient not on anticoagulation due to frequent falls.  Hypothermia Most likely environmentally induced as  he was outdoors yesterday morning and was found on the ground.  There was some concern for sepsis due to his hypothermia however his lactic acid level and procalcitonin were noted to be normal.  WBC was normal.   Patient was taken off of his antibacterials.  Procalcitonin level was normal.  Blood cultures are negative.  No infectious source identified. There was a mention of possible consolidative process in his right lung on CT imaging however chest x-ray did not show any such process. Patient does not have any respiratory symptoms. Speech therapy evaluation.   Fall Noted to be lying on the floor outside his apartment.  Baseline functional status is unclear although it appears that he  gets around in a wheelchair.  No injuries noted on imaging studies.  He is able to move his extremities without any problem.  Seen by physical therapy.  Skilled nursing facility is recommended.  Hyponatremia Likely due to hypovolemia.  Improved with IV fluids.  Level has been stable.  Noted to have chronic hyponatremia based on his previous labs. He had a sodium of 133 in 2018.  Macrocytic anemia/folic acid deficiency Mild drop in hemoglobin is likely dilutional.  No evidence of overt bleeding.  TSH 3.9. Anemia panel reviewed.  Continue to supplement folic acid.  B12 is not deficient. Ferritin is 716.   Minimally elevated troponin with abnormal EKG EKG similar to previous ones. Patient denies chest discomfort. Reason for mild elevation in troponin is not clear.  No further work-up anticipated at this time.  Chronic kidney disease stage IIIa Stable.  Monitor urine output.  Skin tear left elbow Wound care.  Pressure injury stage II sacrum Pressure Injury 05/22/20 Sacrum Posterior;Mid Stage 2 -  Partial thickness loss of dermis presenting as a shallow open injury with a red, pink wound bed without slough. (Active)  05/22/20 0300  Location: Sacrum  Location Orientation: Posterior;Mid  Staging: Stage 2 -  Partial thickness loss of dermis presenting as a shallow open injury with a red, pink wound bed without slough.  Wound Description (Comments):   Present on Admission: Yes      DVT Prophylaxis: SCDs Code Status: DNR Family Communication: Needs updated yesterday. Disposition Plan: Will need higher level of care.  SNF recommended by physical therapy.    Status is: Inpatient  Remains inpatient appropriate because:IV treatments appropriate due to intensity of illness or inability to take PO and Inpatient level of care appropriate due to severity of illness   Dispo: The patient is from: Independent living facility?              Anticipated d/c is to: SNF              Anticipated d/c  date is: 2 days              Patient currently is not medically stable to d/c.       Medications:  Scheduled: . Chlorhexidine Gluconate Cloth  6 each Topical Daily  . folic acid  1 mg Oral Daily  . midodrine  10 mg Oral TID WC  . mupirocin ointment  1 application Nasal BID   Continuous: . sodium chloride 10 mL/hr at 05/23/20 1500   [id number] **OR** acetaminophen, metoprolol tartrate, ondansetron **OR** ondansetron (ZOFRAN) IV   Objective:  Vital Signs  Vitals:   05/24/20 0100 05/24/20 0222 05/24/20 0400 05/24/20 0800  BP:   102/73 (!) 122/48  Pulse:   80 81  Resp:   16 13  Temp:  98.1 F (36.7 C)  98.2 F (36.8 C) (!) 96.9 F (36.1 C)  TempSrc: Oral  Oral Axillary  SpO2:   94% 96%  Weight:  66 kg    Height:        Intake/Output Summary (Last 24 hours) at 05/24/2020 0942 Last data filed at 05/24/2020 0905 Gross per 24 hour  Intake 263.41 ml  Output 780 ml  Net -516.59 ml   Filed Weights   05/22/20 0315 05/23/20 0359 05/24/20 0222  Weight: 66 kg 66 kg 66 kg    General appearance: Somnolent but easily arousable.  In no distress.  Very hard of hearing. Resp: Clear to auscultation bilaterally.  Normal effort Cardio: S1-S2 is irregularly irregular.  No S3-S4.  No rubs murmurs or bruit.  Telemetry shows atrial fibrillation.  Heart rate has been ranging between 80-120.  Mostly in the 100-120 range. GI: Abdomen is soft.  Nontender nondistended.  Bowel sounds are present normal.  No masses organomegaly Extremities: No edema.  Able to move all of his extremities Neurologic:   No focal neurological deficits.     Lab Results:  Data Reviewed: I have personally reviewed following labs and imaging studies  CBC: Recent Labs  Lab 05/21/20 1036 05/21/20 2016 05/22/20 0527 05/23/20 0203 05/24/20 0236  WBC 5.9  --  4.7 8.8 6.8  NEUTROABS 4.9  --   --   --   --   HGB 9.6* 8.3* 8.6* 8.8* 8.8*  HCT 27.6* 23.7* 25.2* 25.8* 26.1*  MCV 101.1*  --  103.7*  103.6* 105.2*  PLT 121*  --  143* 134* 111*    Basic Metabolic Panel: Recent Labs  Lab 05/21/20 1036 05/22/20 0527 05/23/20 0203 05/24/20 0236  NA 127* 130* 131* 130*  K 4.5 4.4 4.2 4.0  CL 96* 103 104 104  CO2 21* 20* 24 20*  GLUCOSE 114* 90 114* 106*  BUN 15 11 14 12   CREATININE 1.22 1.07 0.89 0.73  CALCIUM 8.4* 7.8* 8.2* 8.1*  PHOS  --  2.7  --   --     GFR: Estimated Creatinine Clearance: 51 mL/min (by C-G formula based on SCr of 0.73 mg/dL).  Liver Function Tests: Recent Labs  Lab 05/22/20 0527 05/23/20 0203 05/24/20 0236  AST 21 18 20   ALT 13 13 13   ALKPHOS 56 67 54  BILITOT 0.9 0.5 0.7  PROT 4.4* 4.5* 4.4*  ALBUMIN 2.9* 2.8* 2.5*    Coagulation Profile: Recent Labs  Lab 05/22/20 0527  INR 1.2     Thyroid Function Tests: Recent Labs    05/21/20 2136  TSH 3.936    Anemia Panel: Recent Labs    05/23/20 0203  VITAMINB12 1,797*  FOLATE 3.5*  FERRITIN 716*  TIBC 170*  IRON 61  RETICCTPCT 2.5    Recent Results (from the past 240 hour(s))  Resp Panel by RT-PCR (Flu A&B, Covid) Nasopharyngeal Swab     Status: None   Collection Time: 05/21/20  3:08 PM   Specimen: Nasopharyngeal Swab; Nasopharyngeal(NP) swabs in vial transport medium  Result Value Ref Range Status   SARS Coronavirus 2 by RT PCR NEGATIVE NEGATIVE Final    Comment: (NOTE) SARS-CoV-2 target nucleic acids are NOT DETECTED.  The SARS-CoV-2 RNA is generally detectable in upper respiratory specimens during the acute phase of infection. The lowest concentration of SARS-CoV-2 viral copies this assay can detect is 138 copies/mL. A negative result does not preclude SARS-Cov-2 infection and should not be used as the sole basis  for treatment or other patient management decisions. A negative result may occur with  improper specimen collection/handling, submission of specimen other than nasopharyngeal swab, presence of viral mutation(s) within the areas targeted by this assay, and  inadequate number of viral copies(<138 copies/mL). A negative result must be combined with clinical observations, patient history, and epidemiological information. The expected result is Negative.  Fact Sheet for Patients:  BloggerCourse.com  Fact Sheet for Healthcare Providers:  SeriousBroker.it  This test is no t yet approved or cleared by the [place] FDA and  has been authorized for detection and/or diagnosis of SARS-CoV-2 by FDA under an Emergency Use Authorization (EUA). This EUA will remain  in effect (meaning this test can be used) for the duration of the COVID-19 declaration under Section 564(b)(1) of the Act, 21 U.S.C.section 360bbb-3(b)(1), unless the authorization is terminated  or revoked sooner.       Influenza A by PCR NEGATIVE NEGATIVE Final   Influenza B by PCR NEGATIVE NEGATIVE Final    Comment: (NOTE) The Xpert Xpress SARS-CoV-2/FLU/RSV plus assay is intended as an aid in the diagnosis of influenza from Nasopharyngeal swab specimens and should not be used as a sole basis for treatment. Nasal washings and aspirates are unacceptable for Xpert Xpress SARS-CoV-2/FLU/RSV testing.  Fact Sheet for Patients: BloggerCourse.com  Fact Sheet for Healthcare Providers: SeriousBroker.it  This test is not yet approved or cleared by the [place] FDA and has been authorized for detection and/or diagnosis of SARS-CoV-2 by FDA under an Emergency Use Authorization (EUA). This EUA will remain in effect (meaning this test can be used) for the duration of the COVID-19 declaration under Section 564(b)(1) of the Act, 21 U.S.C. section 360bbb-3(b)(1), unless the authorization is terminated or revoked.  Performed at [facility], [address] [place], [place] [address]   Culture, Urine     Status: Abnormal   Collection Time: 05/21/20  3:08 PM    Specimen: Urine, Random  Result Value Ref Range Status   Specimen Description   Final    URINE, RANDOM Performed at [facility], [address] [place], [place] [address]    Special Requests   Final    NONE Performed at [facility], [address], [place], [place] [address]    Culture MULTIPLE SPECIES PRESENT, SUGGEST RECOLLECTION (A)  Final   Report Status 05/22/2020 FINAL  Final  Culture, blood (routine x 2)     Status: None (Preliminary result)   Collection Time: 05/21/20  3:08 PM   Specimen: BLOOD  Result Value Ref Range Status   Specimen Description   Final    BLOOD BLOOD RIGHT FOREARM Performed at [facility], [address], [place], [place] [address]    Special Requests   Final    Blood Culture adequate volume Performed at [facility], [address] [place], [place] [address]    Culture   Final    NO GROWTH 3 DAYS Performed at [facility], [address], [place], [place] [address]    Report Status PENDING  Incomplete  Culture, blood (routine x 2)     Status: None (Preliminary result)   Collection Time: 05/21/20  3:08 PM   Specimen: BLOOD  Result Value Ref Range Status   Specimen Description   Final    BLOOD BLOOD LEFT FOREARM Performed at [facility], [address] [place], [place] [address]    Special Requests  Final    Blood Culture adequate volume Performed at [facility] [address] [place], [place] [address]    Culture   Final    NO GROWTH 3 DAYS Performed at [facility], [place] [address] [place], [place] [address]    Report Status PENDING  Incomplete  MRSA PCR Screening     Status: Abnormal   Collection Time: 05/22/20  3:19 AM   Specimen: Nasopharyngeal  Result Value Ref Range Status   MRSA by PCR POSITIVE (A) NEGATIVE Final    Comment:        The GeneXpert MRSA Assay (FDA approved for NASAL  specimens only), is one component of a comprehensive MRSA colonization surveillance program. It is not intended to diagnose MRSA infection nor to guide or monitor treatment for MRSA infections. CRITICAL RESULT CALLED TO, READ BACK BY AND VERIFIED WITH: SHAY [facility] RN 05/22/20@ 0458 BY P.HENDERSON Performed at [facility] [address], [place], [place] [address]       Radiology Studies: No results found.     LOS: 3 days   Gerald Sandoval [employer] on [email or web address]  05/24/2020, 9:42 AM

## 2020-05-24 NOTE — Evaluation (Signed)
Occupational Therapy Evaluation Patient Details Name: Gerald Sandoval. MRN: [id number] DOB: [birth year] Today's Date: 05/24/2020    History of Present Illness 86 y.o. male with medical history significant of Afib, BPH, Hearing loss.   Patient apparently lives in an independent living facility.  He was able to get out of his building on the day of admission admitted to the sidewalk and apparently was found on the ground with his wheelchair noted to be tipped over.  He appeared to be confused.  He was sent to the emergency department.  He was noted to have hypothermia   Clinical Impression   Patient HOH and disoriented to place therefore difficulty obtaining PLOF. Per chart was at ILF. Does report using wheelchair for mobility. Currently patient with poor sitting and standing balance, reliant on UEs and external assistance to maintain. Patient mod A for UB ADL, total A for LB ADLs and mod A x2 to stand. Recommend continued acute OT services to maximize patient balance, activity tolerance, strength in order to reduce caregiver burden and facilitate D/C to venue listed below.    Follow Up Recommendations  SNF    Equipment Recommendations  None recommended by OT       Precautions / Restrictions Precautions Precautions: Fall Precaution Comments: monitor vitals Restrictions Weight Bearing Restrictions: No      Mobility Bed Mobility Overal bed mobility: Needs Assistance Bed Mobility: Supine to Sit;Sit to Supine     Supine to sit: Max assist;+2 for physical assistance;+2 for safety/equipment;HOB elevated Sit to supine: Max assist;+2 for physical assistance;+2 for safety/equipment   General bed mobility comments: multimodal cues however patient does not initaite much bed mobility other than moving L LE toward EOB, utilized bed pad to position patient to EOB.    Transfers Overall transfer level: Needs assistance Equipment used: None Transfers: Sit to/from Stand Sit to Stand: Mod  assist;+2 physical assistance;From elevated surface         General transfer comment: please see toilet transfer in ADL section    Balance Overall balance assessment: Needs assistance Sitting-balance support: Bilateral upper extremity supported;Feet supported Sitting balance-Leahy Scale: Poor Sitting balance - Comments: reliant on UE support, and intermittent posterior lean   Standing balance support: Bilateral upper extremity supported Standing balance-Leahy Scale: Poor Standing balance comment: reliant on external assistance                           ADL either performed or assessed with clinical judgement   ADL Overall ADL's : Needs assistance/impaired Eating/Feeding: Set up;Bed level   Grooming: Set up;Bed level   Upper Body Bathing: Sitting;Moderate assistance   Lower Body Bathing: Total assistance;Sitting/lateral leans   Upper Body Dressing : Moderate assistance;Sitting Upper Body Dressing Details (indicate cue type and reason): poor sitting balance Lower Body Dressing: Total assistance;Sitting/lateral leans Lower Body Dressing Details (indicate cue type and reason): poor sitting and standing balance, limited activity tolerance Toilet Transfer: Moderate assistance;+2 for physical assistance Toilet Transfer Details (indicate cue type and reason): sit to stand from EOB, pt able to stand for ~10 seconds mod x2before fatigued and starting to lean posteriorly therefore returned back to bed Toileting- Clothing Manipulation and Hygiene: Total assistance       Functional mobility during ADLs: Moderate assistance;+2 for physical assistance;+2 for safety/equipment General ADL Comments: unsure of patient's baseline as he is disoriented to place and is HOH/difficulty communicating however patient with decreased activity tolerance, balance, strength requiring extensive assist for  ADLs                  Pertinent Vitals/Pain Pain Assessment: Faces Faces Pain Scale:  Hurts a little bit Pain Location: generalized with bed mob Pain Descriptors / Indicators: Grimacing Pain Intervention(s): Monitored during session     Hand Dominance  (did not specify)   Extremity/Trunk Assessment Upper Extremity Assessment Upper Extremity Assessment: Generalized weakness;RUE deficits/detail RUE Deficits / Details: R forearm swelling noted below BP cuff therefore removed and UE elevated   Lower Extremity Assessment Lower Extremity Assessment: Defer to PT evaluation   Cervical / Trunk Assessment Cervical / Trunk Assessment: Kyphotic;Other exceptions Cervical / Trunk Exceptions: foward head posture   Communication Communication Communication: HOH   Cognition Arousal/Alertness: Awake/alert Behavior During Therapy: Flat affect Overall Cognitive Status: Difficult to assess Area of Impairment: Orientation                               General Comments: does not know he is in the hospital              Home Living Family/patient expects to be discharged to:: Skilled nursing facility     Type of Home: Independent living facility                       Home Equipment: Dan Humphreys - 4 wheels;Wheelchair - manual   Additional Comments: per chart review      Prior Functioning/Environment Level of Independence: Independent with assistive device(s);Needs assistance  Gait / Transfers Assistance Needed: pt reports using w/c and "thinks" he can ambulate, admitted from facility ILF?, appears to have fallen out of w/c leading to admission              OT Problem List: Decreased strength;Decreased activity tolerance;Impaired balance (sitting and/or standing);Decreased safety awareness      OT Treatment/Interventions: Self-care/ADL training;Therapeutic exercise;DME and/or AE instruction;Therapeutic activities;Patient/family education;Balance training    OT Goals(Current goals can be found in the care plan section) Acute Rehab OT Goals Patient  Stated Goal: "I'm cold" OT Goal Formulation: With patient Time For Goal Achievement: 06/07/20 Potential to Achieve Goals: Fair  OT Frequency: Min 2X/week    AM-PAC OT "6 Clicks" Daily Activity     Outcome Measure Help from another person eating meals?: A Little Help from another person taking care of personal grooming?: A Little Help from another person toileting, which includes using toliet, bedpan, or urinal?: A Lot Help from another person bathing (including washing, rinsing, drying)?: A Lot Help from another person to put on and taking off regular upper body clothing?: A Lot Help from another person to put on and taking off regular lower body clothing?: Total 6 Click Score: 13   End of Session Equipment Utilized During Treatment: Gait belt Nurse Communication: Mobility status  Activity Tolerance: Patient limited by fatigue Patient left: in bed;with call bell/phone within reach  OT Visit Diagnosis: Other abnormalities of gait and mobility (R26.89);Muscle weakness (generalized) (M62.81);History of falling (Z91.81)                Time: 9485-4627 OT Time Calculation (min): 23 min Charges:  OT General Charges $OT Visit: 1 Visit OT Evaluation $OT Eval Low Complexity: 1 Low OT Treatments $Self Care/Home Management : 8-22 mins  Marlyce Huge OT OT pager: [phone]  Carmelia Roller 05/24/2020, 2:54 PM

## 2020-05-24 NOTE — Progress Notes (Signed)
Pt was stable upon transfer. We set him up in his room on [address]. His nurse Annice Pih was present. Pt's belongings were with him.

## 2020-05-24 NOTE — Plan of Care (Signed)
Upon transfer from ICU pt lert and oriented to self and situation, able to follow commands. Forgetful and requires re-enforcement of information given.

## 2020-05-24 NOTE — TOC Progression Note (Signed)
Transition of Care (TOC) - Progression Note    Patient Details  Name: Gerald Sandoval. MRN: [id number] Date of Birth: 09-14-1923  Transition of Care [facility]) CM/SW Contact  Golda Acre, RN Phone Number: 05/24/2020, 7:42 AM  Clinical Narrative:    Patient faxed out to area snf for review.   Expected Discharge Plan: Home/Self Care Barriers to Discharge: Continued Medical Work up  Expected Discharge Plan and Services Expected Discharge Plan: Home/Self Care   Discharge Planning Services: CM Consult   Living arrangements for the past 2 months: Apartment                                       Social Determinants of Health (SDOH) Interventions    Readmission Risk Interventions No flowsheet data found.

## 2020-05-24 NOTE — TOC Progression Note (Signed)
Transition of Care (TOC) - Progression Note    Patient Details  Name: Gwynn Crossley. MRN: [id number] Date of Birth: 05/12/1924  Transition of Care [facility]) CM/SW Contact  Darleene Cleaver, [place] Phone Number: 05/24/2020, 5:45 PM  Clinical Narrative:     CSW spoke to patient's niece Larita Fife to discuss SNF verse home health.  Per patient's niece patient is from [facility].  Patient has been living there for a couple of years.  Plan is to return back to [facility] once patient receives some short term rehab.  CSW explained the process and what to expect.  CSW informed her that because of his insurance CSW will have to get pre approval for SNF.  CSW explained the process and how insurance will pay for stay.  Patient's niece states that he has been to [facility] in the past and also [facility] she would prefer [facility] again if they can get a bed there.  Patient's niece would like to be notified about what facilities are available.  CSW has began bed search in [facility].             Expected Discharge Plan: Home/Self Care Barriers to Discharge: Continued Medical Work up  Expected Discharge Plan and Services Expected Discharge Plan: Home/Self Care   Discharge Planning Services: CM Consult   Living arrangements for the past 2 months: Apartment                                       Social Determinants of Health (SDOH) Interventions    Readmission Risk Interventions No flowsheet data found.

## 2020-05-24 NOTE — Progress Notes (Signed)
  Speech Language Pathology Treatment: Dysphagia  Patient Details Name: Gerald Sandoval. MRN: [id number] DOB: 1923-06-12 Today's Date: 05/24/2020 Time: 7867-6720 SLP Time Calculation (min) (ACUTE ONLY): 26 min  Assessment / Plan / Recommendation Clinical Impression  SLP follow up to assess po tolerance given BSE completed 2 days prior.  Pt found in room with breakfast tray - excessively masticating solids without clearance over 15 minutes despite cues to swallow, liquid wash and applesauce bolus provided.  He required total cues to expectorate bites of sausage.  And then declined to expectorate any more stating "I don't want to".  Pt states "I don't have any teeth" and admits to difficulty with masticating foods.  When consuming applesauce alone, pt with much improved efficiency of swallowing.  Cough noted with sequential swallows of thin via straw, however small single boluses *SLP pinched straw* were tolerated without s/s of aspiration.  Given pt's edentulous status, current illness and fatigue with attempting to eat due to inefficiency - recommend change diet to dys1/thin with FULL SUPERVISION initially.  Wet voice noted during intake but cleared with further swallows and throat clear *cued.  Suspect pt's diet will be approrpiate to be advanced with medical improvement and upon dc home.  Educated pt to recommendations using written instructions.  Will follow up.    HPI HPI: 86 y.o. male with medical history significant of Afib, BPH, Hearing loss. History is mostly from chart review/ED staff. After initial interview, patient was able to tell me a little more but not entirely clear. He lives in an independent living facility. He somehow was able to get out of one building yesterday and make it to a sidewalk (He confirms this). We are unclear about what happened next, but he was eventually found down with his wheelchair tipped over this morning. He appeared confused and was sent to the ED      SLP  Plan  Continue with current plan of care       Recommendations  Diet recommendations: Dysphagia 1 (puree);Thin liquid Liquids provided via: Cup;Straw;Teaspoon Medication Administration: Whole meds with puree Supervision: Full supervision/cueing for compensatory strategies Compensations: Slow rate;Small sips/bites;Other (Comment) (clear throat if voice is wet) Postural Changes and/or Swallow Maneuvers: Seated upright 90 degrees;Upright 30-60 min after meal                Oral Care Recommendations: Oral care BID Follow up Recommendations: Other (comment) SLP Visit Diagnosis: Dysphagia, oropharyngeal phase (R13.12) Plan: Continue with current plan of care       GO                Chales Abrahams 05/24/2020, 10:33 AM Rolena Infante, MS [facility] SLP Acute Rehab Services Office [phone] Pager [phone]

## 2020-05-24 NOTE — NC FL2 (Signed)
[place] MEDICAID FL2 LEVEL OF CARE SCREENING TOOL     IDENTIFICATION  Patient Name: Gerald Sandoval. Birthdate: Sep 16, 1923 Sex: male Admission Date (Current Location): 05/21/2020  [facility] and [place] Number:  Producer, television/film/video and Address:  [facility],  [address] [place] [address]      Provider Number: [id number]  Attending Physician Name and Address:  Osvaldo Shipper, MD  Relative Name and Phone Number:       Current Level of Care: Hospital Recommended Level of Care: Skilled Nursing Facility Prior Approval Number:    Date Approved/Denied:   PASRR Number: [id number]  Discharge Plan: SNF    Current Diagnoses: Patient Active Problem List   Diagnosis Date Noted  . Pressure injury of skin 05/22/2020  . Syncope 05/21/2020  . Wound of left leg   . Protein-calorie malnutrition, severe 05/08/2016  . Acute encephalopathy 05/07/2016  . Hematoma of leg, left, initial encounter 05/06/2016  . Hyperglycemia 05/06/2016  . Chronic anemia 05/06/2016  . AKI (acute kidney injury) (HCC) 05/05/2016  . Persistent atrial fibrillation (HCC)   . Chronic anticoagulation-Pradaxa prior to admission 06/28/2015  . RBBB 06/28/2015  . DVT of Rt lower limb, acute (HCC) 06/28/2015  . S/P IVC filter 06/27/15 06/28/2015  . S/P Lt hip replacement, chronically infected [facility]) 06/28/2015  . Lactic acidosis   . Encounter for palliative care   . Goals of care, counseling/discussion   . Fracture - Rt hip prosthesis this adm   . Severe sepsis- found down at nursing home 06/25/2015  . Sepsis (HCC) 06/25/2015  . Edema of right lower extremity 06/25/2015  . History of right hip replacement 06/25/2015  . Hyperkalemia 06/25/2015  . Metabolic acidosis 06/25/2015  . EMPHYSEMA 08/03/2009  . Unilateral primary osteoarthritis, right knee 08/03/2009  . ARTHRITIS 08/03/2009  . GASTRIC ULCER 06/02/2009  . GASTRITIS 06/02/2009  . COLONIC POLYPS 05/03/2009  . DYSPHAGIA UNSPECIFIED  05/03/2009    Orientation RESPIRATION BLADDER Height & Weight     Self,Time,Situation,Place  Normal Continent Weight: 66 kg Height:  5\' 6"  (167.6 cm)  BEHAVIORAL SYMPTOMS/MOOD NEUROLOGICAL BOWEL NUTRITION STATUS      Continent Diet (regular)  AMBULATORY STATUS COMMUNICATION OF NEEDS Skin   Extensive Assist Verbally Normal                       Personal Care Assistance Level of Assistance  Bathing,Feeding,Dressing Bathing Assistance: Limited assistance Feeding assistance: Limited assistance Dressing Assistance: Limited assistance     Functional Limitations Info  Sight,Hearing,Speech Sight Info: Adequate Hearing Info: Adequate Speech Info: Adequate    SPECIAL CARE FACTORS FREQUENCY  PT (By licensed PT),OT (By licensed OT)     PT Frequency: 5 x weekly OT Frequency: 5 x weekly            Contractures Contractures Info: Not present    Additional Factors Info  Code Status Code Status Info: DNR             Current Medications (05/24/2020):  This is the current hospital active medication list Current Facility-Administered Medications  Medication Dose Route Frequency Provider Last Rate Last Admin  . 0.9 %  sodium chloride infusion  250 mL Intravenous Continuous 05/26/2020, NP 10 mL/hr at 05/23/20 1500 Infusion Verify at 05/23/20 1500  . acetaminophen (TYLENOL) tablet 650 mg  650 mg Oral Q6H PRN 05/25/20, Tyrone A, DO       Or  . acetaminophen (TYLENOL) suppository 650 mg  650  mg Rectal Q6H PRN Marylyn Ishihara, Tyrone A, DO      . Chlorhexidine Gluconate Cloth 2 % PADS 6 each  6 each Topical Daily Kyle, Tyrone A, DO   6 each at 05/23/20 2057  . folic acid (FOLVITE) tablet 1 mg  1 mg Oral Daily Bonnielee Haff, MD   1 mg at 05/23/20 0855  . metoprolol tartrate (LOPRESSOR) injection 2.5 mg  2.5 mg Intravenous Q6H PRN Bonnielee Haff, MD   2.5 mg at 05/22/20 1123  . midodrine (PROAMATINE) tablet 10 mg  10 mg Oral TID WC Bonnielee Haff, MD      . mupirocin ointment  (BACTROBAN) 2 % 1 application  1 application Nasal BID Cherylann Ratel A, DO   1 application at [id number]  . ondansetron (ZOFRAN) tablet 4 mg  4 mg Oral Q6H PRN Marylyn Ishihara, Tyrone A, DO       Or  . ondansetron (ZOFRAN) injection 4 mg  4 mg Intravenous Q6H PRN Cherylann Ratel A, DO         Discharge Medications: Please see discharge summary for a list of discharge medications.  Relevant Imaging Results:  Relevant Lab Results:   Additional Information SS# [id number]  Leeroy Cha, RN

## 2020-05-25 ENCOUNTER — Other Ambulatory Visit: Payer: Self-pay

## 2020-05-25 ENCOUNTER — Encounter (HOSPITAL_COMMUNITY): Payer: Self-pay | Admitting: Internal Medicine

## 2020-05-25 DIAGNOSIS — I482 Chronic atrial fibrillation, unspecified: Secondary | ICD-10-CM

## 2020-05-25 LAB — COMPREHENSIVE METABOLIC PANEL
ALT: 14 U/L (ref 0–44)
AST: 23 U/L (ref 15–41)
Albumin: 2.5 g/dL — ABNORMAL LOW (ref 3.5–5.0)
Alkaline Phosphatase: 56 U/L (ref 38–126)
Anion gap: 7 (ref 5–15)
BUN: 11 mg/dL (ref 8–23)
CO2: 21 mmol/L — ABNORMAL LOW (ref 22–32)
Calcium: 8.1 mg/dL — ABNORMAL LOW (ref 8.9–10.3)
Chloride: 102 mmol/L (ref 98–111)
Creatinine, Ser: 0.71 mg/dL (ref 0.61–1.24)
GFR, Estimated: >60 mL/min (ref >60)
Glucose, Bld: 107 mg/dL — ABNORMAL HIGH (ref 70–99)
Potassium: 3.9 mmol/L (ref 3.5–5.1)
Sodium: 130 mmol/L — ABNORMAL LOW (ref 135–145)
Total Bilirubin: 0.9 mg/dL (ref 0.3–1.2)
Total Protein: 4.5 g/dL — ABNORMAL LOW (ref 6.5–8.1)

## 2020-05-25 LAB — CBC
HCT: 26 % — ABNORMAL LOW (ref 39.0–52.0)
Hemoglobin: 8.9 g/dL — ABNORMAL LOW (ref 13.0–17.0)
MCH: 35.9 pg — ABNORMAL HIGH (ref 26.0–34.0)
MCHC: 34.2 g/dL (ref 30.0–36.0)
MCV: 104.8 fL — ABNORMAL HIGH (ref 80.0–100.0)
Platelets: 113 10*3/uL — ABNORMAL LOW (ref 150–400)
RBC: 2.48 MIL/uL — ABNORMAL LOW (ref 4.22–5.81)
RDW: 15.3 % (ref 11.5–15.5)
WBC: 6.4 10*3/uL (ref 4.0–10.5)
nRBC: 0 % (ref 0.0–0.2)

## 2020-05-25 MED ORDER — ENSURE ENLIVE PO LIQD
237.0000 mL | Freq: Two times a day (BID) | ORAL | Status: DC
  Administered 2020-05-25 – 2020-05-26 (×3): 237 mL via ORAL

## 2020-05-25 MED ORDER — TAMSULOSIN HCL 0.4 MG PO CAPS
0.4000 mg | ORAL_CAPSULE | Freq: Every day | ORAL | Status: DC
  Administered 2020-05-25 – 2020-05-26 (×2): 0.4 mg via ORAL
  Filled 2020-05-25 (×2): qty 1

## 2020-05-25 MED ORDER — METOPROLOL TARTRATE 25 MG PO TABS: 12.5000 mg | ORAL_TABLET | Freq: Two times a day (BID) | ORAL | Status: AC

## 2020-05-25 MED ORDER — FINASTERIDE 5 MG PO TABS
5.0000 mg | ORAL_TABLET | Freq: Every day | ORAL | Status: DC
  Administered 2020-05-25 – 2020-05-26 (×2): 5 mg via ORAL
  Filled 2020-05-25 (×2): qty 1

## 2020-05-25 MED ORDER — FOLIC ACID 1 MG PO TABS: 1.0000 mg | ORAL_TABLET | Freq: Every day | ORAL | Status: AC

## 2020-05-25 MED ORDER — SACCHAROMYCES BOULARDII 250 MG PO CAPS
250.0000 mg | ORAL_CAPSULE | Freq: Two times a day (BID) | ORAL | Status: AC
Start: 2020-05-25 — End: ?

## 2020-05-25 MED ORDER — SACCHAROMYCES BOULARDII 250 MG PO CAPS
250.0000 mg | ORAL_CAPSULE | Freq: Two times a day (BID) | ORAL | Status: DC
  Administered 2020-05-25 – 2020-05-26 (×3): 250 mg via ORAL
  Filled 2020-05-25 (×3): qty 1

## 2020-05-25 MED ORDER — AMOXICILLIN 250 MG PO CAPS
500.0000 mg | ORAL_CAPSULE | Freq: Two times a day (BID) | ORAL | Status: DC
  Administered 2020-05-25 – 2020-05-26 (×3): 500 mg via ORAL
  Filled 2020-05-25 (×2): qty 2
  Filled 2020-05-25: qty 1
  Filled 2020-05-25: qty 2

## 2020-05-25 MED ORDER — MIDODRINE HCL 10 MG PO TABS
10.0000 mg | ORAL_TABLET | Freq: Three times a day (TID) | ORAL | Status: AC
Start: 2020-05-25 — End: ?

## 2020-05-25 NOTE — Plan of Care (Signed)
  Problem: Education: Goal: Knowledge of General Education information will improve Description: Including pain rating scale, medication(s)/side effects and non-pharmacologic comfort measures Outcome: Progressing   Problem: Health Behavior/Discharge Planning: Goal: Ability to manage health-related needs will improve Outcome: Progressing   Problem: Nutrition: Goal: Adequate nutrition will be maintained Outcome: Progressing   Problem: Skin Integrity: Goal: Risk for impaired skin integrity will decrease Outcome: Progressing   Problem: Clinical Measurements: Goal: Respiratory complications will improve Outcome: Completed/Met

## 2020-05-25 NOTE — Progress Notes (Signed)
Physical Therapy Treatment Patient Details Name: Gerald Sandoval. MRN: [id number] DOB: 1923/05/30 Today's Date: 05/25/2020    History of Present Illness 86 y.o. male with medical history significant of Afib, BPH, Hearing loss.   Patient apparently lives in an independent living facility.  He was able to get out of his building on the day of admission admitted to the sidewalk and apparently was found on the ground with his wheelchair noted to be tipped over.  He appeared to be confused.  He was sent to the emergency department.  He was noted to have hypothermia    PT Comments    Pt requires 2 person assist to mobilize to EOB, attempts to slide BLE over to EOB and performs chin tuck with upper body crunch requiring assist to upright and scoot out to EOB. Pt with LOB backwarsd while seated EOB for ~5 minutes, cued for hands on bed to improve posture and balance, HR fluctuating 120-165bpm. Pt able to stand for ~5 seconds before return to sitting. Pt had BM during stand so assisted back to supine and cued for rolling to change linen and provide pericare. Pt repositioned for comfort in supine with call bell at side. HR 17max noted with mobility, back down to 110 at EOS, pt without complaints of dizziness or heart racing or chest pain. Nursing notified of session. Pt will benefit from continued physical therapy in hospital and recommendations below to increase strength, balance, endurance for safe ADLs and gait.   Follow Up Recommendations  SNF     Equipment Recommendations  None recommended by PT    Recommendations for Other Services       Precautions / Restrictions Precautions Precautions: Fall Precaution Comments: monitor HR Restrictions Weight Bearing Restrictions: No    Mobility  Bed Mobility Overal bed mobility: Needs Assistance Bed Mobility: Supine to Sit;Sit to Supine;Rolling Rolling: Min assistSupine to sit: Mod assist;+2 for physical assistance;HOB elevated Sit to supine: Mod  assist;+2 for physical assistance;HOB elevated   General bed mobility comments: multimodal cues for hand placement, pt mobilizes BLE over to EOB and attempts to raise trunk with chin tuck and upper body crunch requiring assist for fully uprighting trunk and scooting out to EOB; min A to roll to change linen  Transfers Overall transfer level: Needs assistance Equipment used: None Transfers: Sit to/from Stand Sit to Stand: Mod assist;+2 physical assistance    General transfer comment: mod A +2 with therapist and tech positioned at either side of pt, blocking BLE from sliding and knees from buckling, cued for upright posture, spontaneously returns to sitting after ~5 seconds of standing, denies dizziness or other complaints  Ambulation/Gait  General Gait Details: not attempted   Stairs             Wheelchair Mobility    Modified Rankin (Stroke Patients Only)       Balance Overall balance assessment: Needs assistance Sitting-balance support: Bilateral upper extremity supported;Feet supported Sitting balance-Leahy Scale: Poor Sitting balance - Comments: reliant on UE support, intermittent posterior lean improves with cues to maintain hands on bed or bedrail   Standing balance support: Bilateral upper extremity supported Standing balance-Leahy Scale: Poor Standing balance comment: reliant on UE assist and support           Cognition Arousal/Alertness: Awake/alert Behavior During Therapy: Flat affect Overall Cognitive Status: Difficult to assess Area of Impairment: Orientation  General Comments: pt pleasantly confused, states we are "downtown about to cut the grass" and pt repeatedly asks "  can I get out of the vehicle" while seated EOB      Exercises      General Comments General comments (skin integrity, edema, etc.): HR 165 max noted with mobility, HR back down at 110 at EOS while supine in bed      Pertinent Vitals/Pain Pain Assessment: Faces Faces Pain Scale:  No hurt    Home Living                      Prior Function            PT Goals (current goals can now be found in the care plan section) Acute Rehab PT Goals PT Goal Formulation: With patient Time For Goal Achievement: 06/06/20 Potential to Achieve Goals: Good Progress towards PT goals: Progressing toward goals    Frequency    Min 2X/week      PT Plan Current plan remains appropriate    Co-evaluation              AM-PAC PT "6 Clicks" Mobility   Outcome Measure  Help needed turning from your back to your side while in a flat bed without using bedrails?: A Lot Help needed moving from lying on your back to sitting on the side of a flat bed without using bedrails?: A Lot Help needed moving to and from a bed to a chair (including a wheelchair)?: Total Help needed standing up from a chair using your arms (e.g., wheelchair or bedside chair)?: A Lot Help needed to walk in hospital room?: Total Help needed climbing 3-5 steps with a railing? : Total 6 Click Score: 9    End of Session Equipment Utilized During Treatment: Gait belt Activity Tolerance: Other (comment) (limited by HR) Patient left: in bed;with call bell/phone within reach;with bed alarm set Nurse Communication: Mobility status;Other (comment) (HR and BM) PT Visit Diagnosis: Muscle weakness (generalized) (M62.81);Other abnormalities of gait and mobility (R26.89)     Time: 9147-8295 PT Time Calculation (min) (ACUTE ONLY): 20 min  Charges:  $Therapeutic Activity: 8-22 mins                      Tori Yehoshua Vitelli PT, DPT 05/25/20, 10:26 AM

## 2020-05-25 NOTE — TOC Progression Note (Signed)
Transition of Care (TOC) - Progression Note    Patient Details  Name: Gerald Sandoval. MRN: [id number] Date of Birth: April 05, 1924  Transition of Care [facility]) CM/SW Contact  Halford Chessman Phone Number: 05/25/2020, 5:29 PM  Clinical Narrative:     CSW spoke to patient's niece Larita Fife, and she chose [facility] for short term rehab.  CSW spoke to [facility] they can accept patient pending a negative Covid test and insurance authorization.  2:30pm  CSW received phone call from [facility], reference number [id number].  They have approved patient for 6 days next review 4th of January.  SNF to send updates to [employer], fax notes to [phone].  CSW contacted [facility] and they can accept patient tomorrow pending negative Covid test.  CSW updated patient's family, physician, and bedside nurse.  CSW to continue to follow patient's progress throughout discharge planning.   Expected Discharge Plan: Home/Self Care Barriers to Discharge: Continued Medical Work up  Expected Discharge Plan and Services Expected Discharge Plan: Home/Self Care   Discharge Planning Services: CM Consult   Living arrangements for the past 2 months: Apartment                                       Social Determinants of Health (SDOH) Interventions    Readmission Risk Interventions No flowsheet data found.

## 2020-05-25 NOTE — Discharge Summary (Addendum)
[facility]  Physician Discharge Summary   Patient ID: Gerald Sandoval. MRN: [id number] DOB/AGE: [birth year] 84 y.o.  Admit date: 05/21/2020 Discharge date: 05/25/2020  PCP: Crist Infante, MD  DISCHARGE DIAGNOSES:  Hypotension, transient, resolved Atrial fibrillation with RVR, improved Hypothermia likely environmentally induced, resolved Hyponatremia, stable Folic acid deficiency Macrocytic anemia Chronic kidney disease stage IIIa Skin tear left elbow  RECOMMENDATIONS FOR OUTPATIENT FOLLOW UP: 1. Please reorient patient daily 2. Palliative care to follow at skilled nursing facility 3. Speech therapy to follow at SNF for dysphagia    Home Health: Going to SNF Equipment/Devices: None  CODE STATUS: DNR  DISCHARGE CONDITION: fair  Diet recommendation: Dysphagia 1 diet with thin liquid  INITIAL HISTORY: 86 y.o.malewith medical history significant ofAfib, BPH, Hearing loss.  Patient apparently lives in an independent living facility.  He was able to get out of his building on the day of admission admitted to the sidewalk and apparently was found on the ground with his wheelchair noted to be tipped over.  He appeared to be confused.  He was sent to the emergency department.  He was noted to have hypothermia.  He was hospitalized for further management.   Consultations:  None  Procedures:  None   HOSPITAL COURSE:   Hypotension Patient was noted to be hypotensive at the time of admission. He was taken off of his antihypertensives. He was given fluid bolus. He required Levophed for about 18 hours. Slowly weaned off. Reason for low blood pressure is not entirely clear but could be due to hypovolemia. Improved with fluid bolus. Cortisol level was normal. He was started on midodrine. Per family he has had low blood pressures recently for the past few weeks.  Patient's blood pressure has been stable on midodrine.  He is also tolerating the metoprolol quite well.   Seen by PT and OT.  SNF is recommended.  Atrial fibrillation with RVR Initially noted to have RVR while he was on vasopressors.    Has been challenging to treat because of his hypotension.  He was placed on midodrine and started on very low-dose metoprolol.  Heart rate has been reasonably well controlled now.  Not a candidate for anticoagulation due to frequent falls.    Hypothermia Most likely environmentally induced as he was outdoors yesterday morning and was found on the ground.  There was some concern for sepsis due to his hypothermia however his lactic acid level and procalcitonin were noted to be normal.  WBC was normal.   Patient was taken off of his antibacterials.  Procalcitonin level was normal.  Blood cultures are negative.  No infectious source identified. There was a mention of possible consolidative process in his right lung on CT imaging however chest x-ray did not show any such process. Patient does not have any respiratory symptoms. Speech therapy evaluation.   Mild confusion likely due to hospital stay Noted to be mildly distracted this morning.  He was reoriented.  No focal deficits.  Fall Noted to be lying on the floor outside his apartment.   No injuries noted on imaging studies.  He is able to move his extremities without any problem.  Seen by physical therapy.  Skilled nursing facility is recommended.  Hyponatremia Likely due to hypovolemia.  Improved with IV fluids.  Level has been stable.  Noted to have chronic hyponatremia based on his previous labs. He had a sodium of 133 in 2018.  Macrocytic anemia/folic acid deficiency Mild drop in hemoglobin is likely dilutional.  No evidence of overt bleeding.  TSH 3.9. Anemia panel reviewed.  Continue to supplement folic acid.  B12 is not deficient. Ferritin is 716.  To be on vitamin B12 prior to admission.  Minimally elevated troponin with abnormal EKG EKG similar to previous ones. Patient denies chest discomfort. Reason  for mild elevation in troponin is not clear.  No further work-up anticipated at this time.  Chronic kidney disease stage IIIa Stable.  Monitor urine output.  Skin tear left elbow Wound care.  On antibiotics, long-term Noted to be on amoxicillin.  This was verified with his PCP.  We will continue for now.  Presumably for previous hip infection.   Pressure injury stage II sacrum Pressure Injury 05/22/20 Sacrum Posterior;Mid Stage 2 -  Partial thickness loss of dermis presenting as a shallow open injury with a red, pink wound bed without slough. (Active)  05/22/20 0300  Location: Sacrum  Location Orientation: Posterior;Mid  Staging: Stage 2 -  Partial thickness loss of dermis presenting as a shallow open injury with a red, pink wound bed without slough.  Wound Description (Comments):   Present on Admission: Yes    Overall patient is stable.  Noted to be mildly confused this morning.  Might be getting hospital delirium.  He was reoriented.  No focal deficits noted.  The sooner he gets to rehab the better it will be for him.  Otherwise he is medically stable.  Discussed with his niece who is the primary decision maker.    Okay for discharge to SNF whenever bed is available.  PERTINENT LABS:  The results of significant diagnostics from this hospitalization (including imaging, microbiology, ancillary and laboratory) are listed below for reference.    Microbiology: Recent Results (from the past 240 hour(s))  Resp Panel by RT-PCR (Flu A&B, Covid) Nasopharyngeal Swab     Status: None   Collection Time: 05/21/20  3:08 PM   Specimen: Nasopharyngeal Swab; Nasopharyngeal(NP) swabs in vial transport medium  Result Value Ref Range Status   SARS Coronavirus 2 by RT PCR NEGATIVE NEGATIVE Final    Comment: (NOTE) SARS-CoV-2 target nucleic acids are NOT DETECTED.  The SARS-CoV-2 RNA is generally detectable in upper respiratory specimens during the acute phase of infection. The  lowest concentration of SARS-CoV-2 viral copies this assay can detect is 138 copies/mL. A negative result does not preclude SARS-Cov-2 infection and should not be used as the sole basis for treatment or other patient management decisions. A negative result may occur with  improper specimen collection/handling, submission of specimen other than nasopharyngeal swab, presence of viral mutation(s) within the areas targeted by this assay, and inadequate number of viral copies(<138 copies/mL). A negative result must be combined with clinical observations, patient history, and epidemiological information. The expected result is Negative.  Fact Sheet for Patients:  EntrepreneurPulse.com.au  Fact Sheet for Healthcare Providers:  IncredibleEmployment.be  This test is no t yet approved or cleared by the [place] FDA and  has been authorized for detection and/or diagnosis of SARS-CoV-2 by FDA under an Emergency Use Authorization (EUA). This EUA will remain  in effect (meaning this test can be used) for the duration of the COVID-19 declaration under Section 564(b)(1) of the Act, 21 U.S.C.section 360bbb-3(b)(1), unless the authorization is terminated  or revoked sooner.       Influenza A by PCR NEGATIVE NEGATIVE Final   Influenza B by PCR NEGATIVE NEGATIVE Final    Comment: (NOTE) The Xpert Xpress SARS-CoV-2/FLU/RSV plus assay is intended as an  aid in the diagnosis of influenza from Nasopharyngeal swab specimens and should not be used as a sole basis for treatment. Nasal washings and aspirates are unacceptable for Xpert Xpress SARS-CoV-2/FLU/RSV testing.  Fact Sheet for Patients: EntrepreneurPulse.com.au  Fact Sheet for Healthcare Providers: IncredibleEmployment.be  This test is not yet approved or cleared by the [place] FDA and has been authorized for detection and/or diagnosis of SARS-CoV-2 by FDA under  an Emergency Use Authorization (EUA). This EUA will remain in effect (meaning this test can be used) for the duration of the COVID-19 declaration under Section 564(b)(1) of the Act, 21 U.S.C. section 360bbb-3(b)(1), unless the authorization is terminated or revoked.  Performed at [facility], [place] [address] [place], [place] [address]   Culture, Urine     Status: Abnormal   Collection Time: 05/21/20  3:08 PM   Specimen: Urine, Random  Result Value Ref Range Status   Specimen Description   Final    URINE, RANDOM Performed at [facility] [address], [place], [place] [address]    Special Requests   Final    NONE Performed at [facility], [place] [address] [place], [place] [address]    Culture MULTIPLE SPECIES PRESENT, SUGGEST RECOLLECTION (A)  Final   Report Status 05/22/2020 FINAL  Final  Culture, blood (routine x 2)     Status: None (Preliminary result)   Collection Time: 05/21/20  3:08 PM   Specimen: BLOOD  Result Value Ref Range Status   Specimen Description   Final    BLOOD BLOOD RIGHT FOREARM Performed at [facility] [address] [place], [place] [address]    Special Requests   Final    Blood Culture adequate volume Performed at [facility] [address] [place], [place] [address]    Culture   Final    NO GROWTH 4 DAYS Performed at [facility], [place] [address], [place], [place] [address]    Report Status PENDING  Incomplete  Culture, blood (routine x 2)     Status: None (Preliminary result)   Collection Time: 05/21/20  3:08 PM   Specimen: BLOOD  Result Value Ref Range Status   Specimen Description   Final    BLOOD BLOOD LEFT FOREARM Performed at [facility] [address], [place], [place] [address]    Special Requests   Final    Blood Culture adequate volume Performed at [facility] [address],  [place], [place] [address]    Culture   Final    NO GROWTH 4 DAYS Performed at [facility], [place] [address], [place], [place] [address]    Report Status PENDING  Incomplete  MRSA PCR Screening     Status: Abnormal   Collection Time: 05/22/20  3:19 AM   Specimen: Nasopharyngeal  Result Value Ref Range Status   MRSA by PCR POSITIVE (A) NEGATIVE Final    Comment:        The GeneXpert MRSA Assay (FDA approved for NASAL specimens only), is one component of a comprehensive MRSA colonization surveillance program. It is not intended to diagnose MRSA infection nor to guide or monitor treatment for MRSA infections. CRITICAL RESULT CALLED TO, READ BACK BY AND VERIFIED WITH: SHAY [facility] RN 05/22/20@ 0458 BY P.HENDERSON Performed at [facility] [address] [place], [place] [address]      Labs:  [place]  05/23/20 0203  FERRITIN 716*    Lab Results  Component Value Date   SARSCOV2NAA NEGATIVE 05/21/2020      Basic Metabolic Panel: Recent Labs  Lab 05/21/20 1036 05/22/20 0527 05/23/20 0203 05/24/20 0236 05/25/20 0405  NA 127* 130* 131* 130* 130*  K 4.5 4.4 4.2 4.0 3.9  CL 96* 103 104 104 102  CO2 21* 20* 24 20* 21*  GLUCOSE 114* 90 114* 106* 107*  BUN 15 11 14 12 11   CREATININE 1.22 1.07 0.89 0.73 0.71  CALCIUM 8.4* 7.8* 8.2* 8.1* 8.1*  PHOS  --  2.7  --   --   --    Liver Function Tests: Recent Labs  Lab 05/22/20 0527 05/23/20 0203 05/24/20 0236 05/25/20 0405  AST 21 18 20 23   ALT 13 13 13 14   ALKPHOS 56 67 54 56  BILITOT 0.9 0.5 0.7 0.9  PROT 4.4* 4.5* 4.4* 4.5*  ALBUMIN 2.9* 2.8* 2.5* 2.5*   CBC: Recent Labs  Lab 05/21/20 1036 05/21/20 2016 05/22/20 0527 05/23/20 0203 05/24/20 0236 05/25/20 0405  WBC 5.9  --  4.7 8.8 6.8 6.4  NEUTROABS 4.9  --   --   --   --   --   HGB 9.6* 8.3* 8.6* 8.8* 8.8* 8.9*  HCT 27.6* 23.7* 25.2* 25.8* 26.1* 26.0*  MCV 101.1*  --  103.7* 103.6* 105.2* 104.8*  PLT 121*   --  143* 134* 111* 113*     IMAGING STUDIES DG Chest 2 View  Result Date: 05/21/2020 CLINICAL DATA:  Found outside. EXAM: CHEST - 2 VIEW COMPARISON:  05/22/2016 FINDINGS: Lucency under the right diaphragm which appears tubular and bounded by folds, attributed to bowel. Chronic cardiomegaly and interstitial coarsening. There is no edema, consolidation, effusion, or pneumothorax. Remote midthoracic compression fracture with advanced height loss. IMPRESSION: No acute finding when compared to prior. Electronically Signed   By: Monte Fantasia M.D.   On: 05/21/2020 08:53   CT Head Wo Contrast  Result Date: 05/21/2020 CLINICAL DATA:  Fall from wheelchair. EXAM: CT HEAD WITHOUT CONTRAST CT CERVICAL SPINE WITHOUT CONTRAST TECHNIQUE: Multidetector CT imaging of the head and cervical spine was performed following the standard protocol without intravenous contrast. Multiplanar CT image reconstructions of the cervical spine were also generated. COMPARISON:  Head CT 06/30/2016.  Cervical spine CT 05/05/2016. FINDINGS: CT HEAD FINDINGS Brain: There is no evidence for acute hemorrhage, hydrocephalus, mass lesion, or abnormal extra-axial fluid collection. No definite CT evidence for acute infarction. Diffuse loss of parenchymal volume is consistent with atrophy. Patchy low attenuation in the deep hemispheric and periventricular white matter is nonspecific, but likely reflects chronic microvascular ischemic demyelination. Vascular: No hyperdense vessel or unexpected calcification. Skull: No evidence for fracture. No worrisome lytic or sclerotic lesion. Sinuses/Orbits: The visualized paranasal sinuses and mastoid air cells are clear. Visualized portions of the globes and intraorbital fat are unremarkable. Other: None. CT CERVICAL SPINE FINDINGS Alignment: Accentuated thoracic lordosis with associated accentuation of upper thoracic kyphosis. No subluxation. Skull base and vertebrae: No evidence for an acute fracture in  the bony anatomy of the cervical spine. Compression deformity at T1 and T3 is stable since 2017 exam. Soft tissues and spinal canal: Unremarkable. Disc levels: Loss of disc height noted at C5-6 and to a lesser degree at C4-5 and C6-7. Upper chest: Dependent collapse/consolidative opacity in the right hemithorax with potential pleural effusion. Other: None. IMPRESSION: 1. No acute intracranial abnormality. Atrophy with chronic small vessel white matter ischemic disease. 2.  Degenerative changes in the cervical spine without evidence for an acute fracture. 3. Dependent collapse/consolidative opacity in the posterior right hemithorax with potential pleural effusion. Electronically Signed   By: Misty Stanley M.D.   On: 05/21/2020 08:21   CT Cervical Spine Wo Contrast  Result Date: 05/21/2020 CLINICAL DATA:  Fall from wheelchair. EXAM: CT HEAD WITHOUT CONTRAST CT CERVICAL SPINE WITHOUT CONTRAST TECHNIQUE: Multidetector CT imaging of the head and cervical spine was performed following the standard protocol without intravenous contrast. Multiplanar CT image reconstructions of the cervical spine were also generated. COMPARISON:  Head CT 06/30/2016.  Cervical spine CT 05/05/2016. FINDINGS: CT HEAD FINDINGS Brain: There is no evidence for acute hemorrhage, hydrocephalus, mass lesion, or abnormal extra-axial fluid collection. No definite CT evidence for acute infarction. Diffuse loss of parenchymal volume is consistent with atrophy. Patchy low attenuation in the deep hemispheric and periventricular white matter is nonspecific, but likely reflects chronic microvascular ischemic demyelination. Vascular: No hyperdense vessel or unexpected calcification. Skull: No evidence for fracture. No worrisome lytic or sclerotic lesion. Sinuses/Orbits: The visualized paranasal sinuses and mastoid air cells are clear. Visualized portions of the globes and intraorbital fat are unremarkable. Other: None. CT CERVICAL SPINE FINDINGS  Alignment: Accentuated thoracic lordosis with associated accentuation of upper thoracic kyphosis. No subluxation. Skull base and vertebrae: No evidence for an acute fracture in the bony anatomy of the cervical spine. Compression deformity at T1 and T3 is stable since 2017 exam. Soft tissues and spinal canal: Unremarkable. Disc levels: Loss of disc height noted at C5-6 and to a lesser degree at C4-5 and C6-7. Upper chest: Dependent collapse/consolidative opacity in the right hemithorax with potential pleural effusion. Other: None. IMPRESSION: 1. No acute intracranial abnormality. Atrophy with chronic small vessel white matter ischemic disease. 2. Degenerative changes in the cervical spine without evidence for an acute fracture. 3. Dependent collapse/consolidative opacity in the posterior right hemithorax with potential pleural effusion. Electronically Signed   By: Misty Stanley M.D.   On: 05/21/2020 08:21   DG Knee Complete 4 Views Left  Result Date: 05/21/2020 CLINICAL DATA:  Fall. EXAM: LEFT KNEE - COMPLETE 4+ VIEW COMPARISON:  None. FINDINGS: No evidence of fracture, dislocation, or joint effusion. No evidence of arthropathy or other focal bone abnormality. Soft tissues are unremarkable. IMPRESSION: Negative. Electronically Signed   By: Misty Stanley M.D.   On: 05/21/2020 08:52    DISCHARGE EXAMINATION: Vitals:   05/24/20 1609 05/24/20 2045 05/24/20 2359 05/25/20 0527  BP: 119/77 112/64 106/60 126/67  Pulse: (!) 105 80 84 68  Resp: 14 19 18 20   Temp: 97.9 F (36.6 C) 98.2 F (36.8 C) 98.8 F (37.1 C) 98.1 F (36.7 C)  TempSrc: Oral   Oral  SpO2: 93% 95% 97% 95%  Weight:    72 kg  Height:       General appearance: Awake alert.  In no distress.  Distracted Resp: Clear to auscultation bilaterally.  Normal effort Cardio: S1-S2 is irregularly irregular.  No S3-S4.  No rubs murmurs or bruit.  Telemetry reviewed.  Heart rate mainly between 90 to 110. GI: Abdomen is soft.  Nontender  nondistended.  Bowel sounds are present normal.  No masses organomegaly Extremities: No edema.  Able to lift both his legs off the bed Neurologic:  No focal neurological deficits.    DISPOSITION: SNF  Discharge Instructions    Call MD for:  extreme fatigue   Complete by: As directed    Call MD for:  persistant dizziness  or light-headedness   Complete by: As directed    Call MD for:  persistant nausea and vomiting   Complete by: As directed    Call MD for:  redness, tenderness, or signs of infection (pain, swelling, redness, odor or green/yellow discharge around incision site)   Complete by: As directed    Call MD for:  severe uncontrolled pain   Complete by: As directed    Call MD for:  temperature >100.4   Complete by: As directed    Discharge instructions   Complete by: As directed    Please review instructions on the discharge summary.  You were cared for by a hospitalist during your hospital stay. If you have any questions about your discharge medications or the care you received while you were in the hospital after you are discharged, you can call the unit and asked to speak with the hospitalist on call if the hospitalist that took care of you is not available. Once you are discharged, your primary care physician will handle any further medical issues. Please note that NO REFILLS for any discharge medications will be authorized once you are discharged, as it is imperative that you return to your primary care physician (or establish a relationship with a primary care physician if you do not have one) for your aftercare needs so that they can reassess your need for medications and monitor your lab values. If you do not have a primary care physician, you can call [phone] for a physician referral.   Increase activity slowly   Complete by: As directed    No wound care   Complete by: As directed         Allergies as of 05/25/2020      Reactions   Hydrocodone-acetaminophen Other  (See Comments)   DELIRIUM, AGITATION, HYPERACTIVITY   Seroquel [quetiapine] Other (See Comments)   DELIRIUM, AGITATION, HYPERACTIVITY   Primidone Other (See Comments)      Medication List    STOP taking these medications   metoprolol succinate 50 MG 24 hr tablet Commonly known as: TOPROL-XL   sulfamethoxazole-trimethoprim 800-160 MG tablet Commonly known as: BACTRIM DS   traMADol 50 MG tablet Commonly known as: ULTRAM     TAKE these medications   amoxicillin 500 MG capsule Commonly known as: AMOXIL Take 500 mg by mouth 2 (two) times daily.   cetirizine 10 MG tablet Commonly known as: ZYRTEC Take 10 mg by mouth daily.   cholecalciferol 1000 units tablet Commonly known as: VITAMIN D Take 2,000 Units by mouth in the morning and at bedtime.   ferrous sulfate 325 (65 FE) MG tablet Take 325 mg by mouth daily with breakfast.   finasteride 5 MG tablet Commonly known as: PROSCAR Take 5 mg by mouth daily.   folic acid 1 MG tablet Commonly known as: FOLVITE Take 1 tablet (1 mg total) by mouth daily.   metoprolol tartrate 25 MG tablet Commonly known as: LOPRESSOR Take 0.5 tablets (12.5 mg total) by mouth 2 (two) times daily.   midodrine 10 MG tablet Commonly known as: PROAMATINE Take 1 tablet (10 mg total) by mouth 3 (three) times daily with meals.   mirtazapine 15 MG tablet Commonly known as: REMERON Take 15 mg by mouth at bedtime.   polyethylene glycol 17 g packet Commonly known as: MiraLax Take 17 g by mouth daily. What changed:   when to take this  reasons to take this   saccharomyces boulardii 250 MG capsule Commonly known as: [employer]  Take 1 capsule (250 mg total) by mouth 2 (two) times daily.   senna-docusate 8.6-50 MG tablet Commonly known as: Senokot-S Take 1 tablet by mouth at bedtime. What changed: Another medication with the same name was removed. Continue taking this medication, and follow the directions you see here.   tamsulosin 0.4 MG  Caps capsule Commonly known as: FLOMAX Take 0.4 mg by mouth daily.   Vitamin B-12 2500 MCG Subl Place 2,500 mcg under the tongue 2 (two) times daily.         Follow-up Information    Crist Infante, MD. Schedule an appointment as soon as possible for a visit today.   Specialty: Internal Medicine Contact information: [address] [place] [address] [phone]               TOTAL DISCHARGE TIME: 63 minutes  [place]  [facility] Pager on [email or web address]  05/25/2020, 10:53 AM

## 2020-05-26 ENCOUNTER — Inpatient Hospital Stay (HOSPITAL_COMMUNITY): Payer: Medicare Other

## 2020-05-26 DIAGNOSIS — E46 Unspecified protein-calorie malnutrition: Secondary | ICD-10-CM | POA: Diagnosis not present

## 2020-05-26 DIAGNOSIS — M7989 Other specified soft tissue disorders: Secondary | ICD-10-CM | POA: Diagnosis not present

## 2020-05-26 DIAGNOSIS — R55 Syncope and collapse: Secondary | ICD-10-CM

## 2020-05-26 DIAGNOSIS — E871 Hypo-osmolality and hyponatremia: Secondary | ICD-10-CM | POA: Diagnosis not present

## 2020-05-26 DIAGNOSIS — M6281 Muscle weakness (generalized): Secondary | ICD-10-CM | POA: Diagnosis not present

## 2020-05-26 DIAGNOSIS — I959 Hypotension, unspecified: Secondary | ICD-10-CM | POA: Diagnosis not present

## 2020-05-26 DIAGNOSIS — Z96649 Presence of unspecified artificial hip joint: Secondary | ICD-10-CM | POA: Diagnosis not present

## 2020-05-26 DIAGNOSIS — I4821 Permanent atrial fibrillation: Secondary | ICD-10-CM | POA: Diagnosis not present

## 2020-05-26 DIAGNOSIS — N400 Enlarged prostate without lower urinary tract symptoms: Secondary | ICD-10-CM | POA: Diagnosis not present

## 2020-05-26 DIAGNOSIS — R41841 Cognitive communication deficit: Secondary | ICD-10-CM | POA: Diagnosis not present

## 2020-05-26 DIAGNOSIS — Z741 Need for assistance with personal care: Secondary | ICD-10-CM | POA: Diagnosis not present

## 2020-05-26 DIAGNOSIS — N1831 Chronic kidney disease, stage 3a: Secondary | ICD-10-CM | POA: Diagnosis not present

## 2020-05-26 DIAGNOSIS — R1312 Dysphagia, oropharyngeal phase: Secondary | ICD-10-CM | POA: Diagnosis not present

## 2020-05-26 DIAGNOSIS — D649 Anemia, unspecified: Secondary | ICD-10-CM | POA: Diagnosis not present

## 2020-05-26 DIAGNOSIS — Z7409 Other reduced mobility: Secondary | ICD-10-CM | POA: Diagnosis not present

## 2020-05-26 DIAGNOSIS — E559 Vitamin D deficiency, unspecified: Secondary | ICD-10-CM | POA: Diagnosis not present

## 2020-05-26 LAB — CBC
HCT: 25.4 % — ABNORMAL LOW (ref 39.0–52.0)
Hemoglobin: 8.8 g/dL — ABNORMAL LOW (ref 13.0–17.0)
MCH: 35.3 pg — ABNORMAL HIGH (ref 26.0–34.0)
MCHC: 34.6 g/dL (ref 30.0–36.0)
MCV: 102 fL — ABNORMAL HIGH (ref 80.0–100.0)
Platelets: 118 10*3/uL — ABNORMAL LOW (ref 150–400)
RBC: 2.49 MIL/uL — ABNORMAL LOW (ref 4.22–5.81)
RDW: 15.3 % (ref 11.5–15.5)
WBC: 6.6 10*3/uL (ref 4.0–10.5)
nRBC: 0 % (ref 0.0–0.2)

## 2020-05-26 LAB — COMPREHENSIVE METABOLIC PANEL
ALT: 17 U/L (ref 0–44)
AST: 24 U/L (ref 15–41)
Albumin: 2.5 g/dL — ABNORMAL LOW (ref 3.5–5.0)
Alkaline Phosphatase: 56 U/L (ref 38–126)
Anion gap: 7 (ref 5–15)
BUN: 14 mg/dL (ref 8–23)
CO2: 25 mmol/L (ref 22–32)
Calcium: 8.3 mg/dL — ABNORMAL LOW (ref 8.9–10.3)
Chloride: 100 mmol/L (ref 98–111)
Creatinine, Ser: 0.62 mg/dL (ref 0.61–1.24)
GFR, Estimated: >60 mL/min (ref >60)
Glucose, Bld: 121 mg/dL — ABNORMAL HIGH (ref 70–99)
Potassium: 4 mmol/L (ref 3.5–5.1)
Sodium: 132 mmol/L — ABNORMAL LOW (ref 135–145)
Total Bilirubin: 0.4 mg/dL (ref 0.3–1.2)
Total Protein: 4.5 g/dL — ABNORMAL LOW (ref 6.5–8.1)

## 2020-05-26 LAB — SARS CORONAVIRUS 2 (TAT 6-24 HRS): SARS Coronavirus 2: NEGATIVE

## 2020-05-26 LAB — CULTURE, BLOOD (ROUTINE X 2)
Culture: NO GROWTH
Culture: NO GROWTH
Special Requests: ADEQUATE
Special Requests: ADEQUATE

## 2020-05-26 NOTE — Plan of Care (Signed)
  Problem: Education: Goal: Knowledge of General Education information will improve Description: Including pain rating scale, medication(s)/side effects and non-pharmacologic comfort measures 05/26/2020 1200 by Mariann Laster, RN Outcome: Adequate for Discharge 05/26/2020 1130 by Mariann Laster, RN Outcome: Progressing   Problem: Health Behavior/Discharge Planning: Goal: Ability to manage health-related needs will improve 05/26/2020 1200 by Mariann Laster, RN Outcome: Adequate for Discharge 05/26/2020 1130 by Mariann Laster, RN Outcome: Progressing   Problem: Clinical Measurements: Goal: Ability to maintain clinical measurements within normal limits will improve Outcome: Adequate for Discharge Goal: Will remain free from infection Outcome: Adequate for Discharge Goal: Diagnostic test results will improve Outcome: Adequate for Discharge Goal: Cardiovascular complication will be avoided 05/26/2020 1200 by Mariann Laster, RN Outcome: Adequate for Discharge 05/26/2020 1130 by Mariann Laster, RN Outcome: Adequate for Discharge   Problem: Activity: Goal: Risk for activity intolerance will decrease Outcome: Adequate for Discharge   Problem: Nutrition: Goal: Adequate nutrition will be maintained Outcome: Adequate for Discharge   Problem: Coping: Goal: Level of anxiety will decrease Outcome: Adequate for Discharge   Problem: Elimination: Goal: Will not experience complications related to bowel motility Outcome: Adequate for Discharge Goal: Will not experience complications related to urinary retention Outcome: Adequate for Discharge   Problem: Pain Managment: Goal: General experience of comfort will improve Outcome: Adequate for Discharge   Problem: Safety: Goal: Ability to remain free from injury will improve Outcome: Adequate for Discharge   Problem: Skin Integrity: Goal: Risk for impaired skin integrity will decrease Outcome: Adequate for Discharge

## 2020-05-26 NOTE — Progress Notes (Signed)
No acute event overnight, discharge summary was done by Dr. Rito Ehrlich on December 30th, his Covid testing is negative, discharge to skilled nursing facility with palliative care following

## 2020-05-26 NOTE — Plan of Care (Signed)
  Problem: Education: Goal: Knowledge of General Education information will improve Description: Including pain rating scale, medication(s)/side effects and non-pharmacologic comfort measures Outcome: Progressing   Problem: Health Behavior/Discharge Planning: Goal: Ability to manage health-related needs will improve Outcome: Progressing   Problem: Clinical Measurements: Goal: Cardiovascular complication will be avoided Outcome: Adequate for Discharge

## 2020-05-26 NOTE — TOC Transition Note (Addendum)
Transition of Care [facility]) - CM/SW Discharge Note   Patient Details  Name: Meir Elwood. MRN: [id number] Date of Birth: [birth year]  [facility]) CM/SW Contact:  Darleene Cleaver, LCSW Phone Number: 05/26/2020, 2:12 PM   Clinical Narrative:     Patient to be d/c'ed today to [facility] room 122.  Patient and family agreeable to plans will transport via ems RN to call report [phone].  CSW was informed that palliative outpatient services are requested, CSW contacted [facility] and gave the referral for outpatient palliative to follow at [facility].  Patient's niece Larita Fife aware of patient discharging today.  Final next level of care: Skilled Nursing Facility Barriers to Discharge: Barriers Resolved   Patient Goals and CMS Choice Patient states their goals for this hospitalization and ongoing recovery are:: To go to SNF for short term rehab, then return back to [email or web address] Compare Post Acute Care list provided to:: Patient Represenative (must comment) Choice offered to / list presented to : [facility] POA / Guardian  Discharge Placement PASRR number recieved: 05/22/20            Patient chooses bed at: [facility] Patient to be transferred to facility by: [facility] Name of family member notified: Niece Larita Fife, [phone] Patient and family notified of of transfer: 05/26/20  Discharge Plan and Services   Discharge Planning Services: CM Consult                                 Social Determinants of Health (SDOH) Interventions     Readmission Risk Interventions No flowsheet data found.

## 2020-05-26 NOTE — Plan of Care (Signed)
  Problem: Clinical Measurements: Goal: Will remain free from infection Outcome: Progressing Goal: Diagnostic test results will improve Outcome: Progressing   Problem: Nutrition: Goal: Adequate nutrition will be maintained Outcome: Progressing   Problem: Elimination: Goal: Will not experience complications related to urinary retention Outcome: Progressing   Problem: Pain Managment: Goal: General experience of comfort will improve Outcome: Progressing

## 2020-05-26 NOTE — Progress Notes (Addendum)
[facility] [employer] [facility]) Hospital Liaison RN note:      Notified by [facility] manager, Windell Moulding, of patient/family request for [facility] Palliative services at [facility] at discharge. Patient discharging today.            [facility] Palliative team will follow up with patient.          Please call with any hospice or palliative related questions.         Thank you for this referral.  Haynes Bast, BSN, RN [facility] Liaison [phone]

## 2020-06-01 DIAGNOSIS — R5381 Other malaise: Secondary | ICD-10-CM | POA: Diagnosis not present

## 2020-06-01 DIAGNOSIS — N1831 Chronic kidney disease, stage 3a: Secondary | ICD-10-CM | POA: Diagnosis not present

## 2020-06-01 DIAGNOSIS — I480 Paroxysmal atrial fibrillation: Secondary | ICD-10-CM | POA: Diagnosis not present

## 2020-06-01 DIAGNOSIS — I10 Essential (primary) hypertension: Secondary | ICD-10-CM | POA: Diagnosis not present

## 2020-06-06 ENCOUNTER — Other Ambulatory Visit: Payer: Self-pay

## 2020-06-06 ENCOUNTER — Non-Acute Institutional Stay: Payer: Self-pay | Admitting: Hospice

## 2020-06-06 DIAGNOSIS — Z515 Encounter for palliative care: Secondary | ICD-10-CM

## 2020-06-06 NOTE — Progress Notes (Signed)
PATIENT NAME: Gerald Sandoval. [address] [place] [address] [phone] (home)  DOB: [birth year] MRN: [id number]  PRIMARY CARE PROVIDER:    Crist Infante, MD,  [place] [address] [phone]  REFERRING PROVIDER:   Crist Infante, [place],  [place] [address] [phone]  RESPONSIBLE PARTY:   Extended Emergency Contact Information Primary Emergency Contact: [facility] Address: [address]          [place], [place] [address] Johnnette Litter of [place] Phone: [phone] Relation: Friend Secondary Emergency Contact: Dixon,Lynn Address: [address]          [place], [place] [address] Johnnette Litter of [place] Phone: [phone] Mobile Phone: [phone] Relation: Niece  TELEHEALTH VISIT STATEMENT Due to the COVID-19 crisis, this visit was done via telephone from my office. It was initiated and consented to by this patient and/or family.  ADVANCE CARE PLANNING/RECOMMENDATIONS/PLAN:    Visit at the request of Martinique Blattenberger, NP for palliative consult. Visit consisted of building trust and discussions on Palliative Medicine as specialized medical care for people living with serious illness, aimed at facilitating better quality of life through symptoms relief, assisting with advance care plan and establishing complex decision making.  NP called niece-Lynn and left a voicemail with callback number.  Visit consisted of counseling and education dealing with the complex and emotionally intense issues of symptom management and palliative care in the setting of serious and potentially life-threatening illness. Palliative care team will continue to support patient, patient's family, and medical team.  Advance Care Planning/CODE STATUS: CODE STATUS reviewed.  Patient is a DO NOT RESUSCITATE.  Signed DNR form in epic and in facility records.  GOALS OF CARE: Goals of care include to maximize quality of life and symptom  management.  Follow up Palliative Care Visit: Palliative care will continue to follow for goals of care clarification and symptom management.  Follow-up in a month/as needed  Symptom Management: Patient in COVID unit, currently on Decadron, azithromycin and oxygen supplementation at 2 L/min.  Nurse Benjamine Mola reports ongoing aspiration despite aspiration precautions in place.  CT consult has been initiated and patient downgraded to pured diet.  Chest x-ray ordered; results pending to determine treatment.  Patient denies pain/discomfort.  Palliative will continue to monitor for symptom management/decline and make recommendations as needed.  Family /Caregiver/Community Supports: Patient in SNF for ongoing care  I spent 1 hour and 16 minutes providing this initial consultation; time includes time spent with patient/family, chart review, provider coordination,  and documentation. More than 50% of the time in this consultation was spent on counseling patient and coordinating communication.   CHIEF COMPLAIN/HISTORY OF PRESENT ILLNESS:  Gerald Sandoval. is a 87 y.o. male with multiple medical problems including COVID-19, A. Fib BPH. History obtained from review of EMR, discussion with primary nurse patient.   Palliative Care was asked to follow this patient by consultation request of Martinique Blattenberger, NP to help address advance care planning and complex decision making. Thank you for the opportunity to participate in the care of [employer].    CODE STATUS: DNR  HOSPICE ELIGIBILITY/DIAGNOSIS: TBD  PAST MEDICAL HISTORY:  Past Medical History:  Diagnosis Date  . Abscess of left hip    infection left hip  . Anemia   . BPH (benign prostatic hyperplasia)   . Colon polyp   . Diastolic dysfunction   . Dysphagia    unspecified  . Emphysema   .  Gastric ulcer   . Gastritis   . Hernia    Rt. Inguinial  . History of colonic polyps   . Hypogonadism in male   . Kidney stones   . Leg wound,  left   . Osteoarthritis   . Osteoporosis   . Permanent atrial fibrillation ([place])   . RBBB (right bundle branch block)   . Rotator cuff injury     SOCIAL HX:  Social History   Tobacco Use  . Smoking status: Never Smoker  . Smokeless tobacco: Never Used  Substance Use Topics  . Alcohol use: No    Alcohol/week: 0.0 standard drinks   FAMILY HX:  Family History  Problem Relation Age of Onset  . Colon cancer Maternal Uncle   . Diabetes Maternal Aunt      Labs:   No results for input(s): WBC, HGB, HCT, PLT, MCV in the last 168 hours. No results for input(s): NA, K, CL, CO2, BUN, CREATININE, GLUCOSE in the last 168 hours.  ALLERGIES:  Allergies  Allergen Reactions  . Hydrocodone-Acetaminophen Other (See Comments)    DELIRIUM, AGITATION, HYPERACTIVITY  . Seroquel [Quetiapine] Other (See Comments)    DELIRIUM, AGITATION, HYPERACTIVITY  . Primidone Other (See Comments)     PERTINENT MEDICATIONS:  Outpatient Encounter Medications as of 06/06/2020  Medication Sig  . amoxicillin (AMOXIL) 500 MG capsule Take 500 mg by mouth 2 (two) times daily.  . cetirizine (ZYRTEC) 10 MG tablet Take 10 mg by mouth daily.  . cholecalciferol (VITAMIN D) 1000 units tablet Take 2,000 Units by mouth in the morning and at bedtime.  . Cyanocobalamin (VITAMIN B-12) 2500 MCG SUBL Place 2,500 mcg under the tongue 2 (two) times daily.  . ferrous sulfate 325 (65 FE) MG tablet Take 325 mg by mouth daily with breakfast.  . finasteride (PROSCAR) 5 MG tablet Take 5 mg by mouth daily.  . folic acid (FOLVITE) 1 MG tablet Take 1 tablet (1 mg total) by mouth daily.  . metoprolol tartrate (LOPRESSOR) 25 MG tablet Take 0.5 tablets (12.5 mg total) by mouth 2 (two) times daily.  . midodrine (PROAMATINE) 10 MG tablet Take 1 tablet (10 mg total) by mouth 3 (three) times daily with meals.  . mirtazapine (REMERON) 15 MG tablet Take 15 mg by mouth at bedtime.   . polyethylene glycol (MIRALAX) packet Take 17 g by mouth daily.  (Patient taking differently: Take 17 g by mouth daily as needed for mild constipation.)  . saccharomyces boulardii (FLORASTOR) 250 MG capsule Take 1 capsule (250 mg total) by mouth 2 (two) times daily.  Marland Kitchen senna-docusate (SENOKOT-S) 8.6-50 MG tablet Take 1 tablet by mouth at bedtime.  . tamsulosin (FLOMAX) 0.4 MG CAPS capsule Take 0.4 mg by mouth daily.    No facility-administered encounter medications on file as of 06/06/2020.    Note: Portions of this note were generated with [employer]. Dictation errors may occur despite best attempts at proofreading.  Teodoro Spray, NP

## 2020-06-27 DEATH — deceased
